# Patient Record
Sex: Female | Born: 1935 | Race: White | Hispanic: No | Smoking: Never smoker
Health system: Southern US, Community
[De-identification: ages and names within clinical notes are randomized; demographics above are authoritative.]

## PROBLEM LIST (undated history)

## (undated) DIAGNOSIS — K5790 Diverticulosis of intestine, part unspecified, without perforation or abscess without bleeding: Secondary | ICD-10-CM

## (undated) DIAGNOSIS — R319 Hematuria, unspecified: Secondary | ICD-10-CM

## (undated) DIAGNOSIS — J45909 Unspecified asthma, uncomplicated: Secondary | ICD-10-CM

## (undated) DIAGNOSIS — J449 Chronic obstructive pulmonary disease, unspecified: Secondary | ICD-10-CM

## (undated) DIAGNOSIS — I10 Essential (primary) hypertension: Secondary | ICD-10-CM

## (undated) DIAGNOSIS — C439 Malignant melanoma of skin, unspecified: Secondary | ICD-10-CM

## (undated) DIAGNOSIS — M810 Age-related osteoporosis without current pathological fracture: Secondary | ICD-10-CM

## (undated) DIAGNOSIS — K449 Diaphragmatic hernia without obstruction or gangrene: Secondary | ICD-10-CM

## (undated) DIAGNOSIS — IMO0002 Reserved for concepts with insufficient information to code with codable children: Secondary | ICD-10-CM

## (undated) DIAGNOSIS — S2239XA Fracture of one rib, unspecified side, initial encounter for closed fracture: Secondary | ICD-10-CM

## (undated) DIAGNOSIS — M1990 Unspecified osteoarthritis, unspecified site: Secondary | ICD-10-CM

## (undated) DIAGNOSIS — M419 Scoliosis, unspecified: Secondary | ICD-10-CM

## (undated) DIAGNOSIS — K219 Gastro-esophageal reflux disease without esophagitis: Secondary | ICD-10-CM

## (undated) DIAGNOSIS — N390 Urinary tract infection, site not specified: Secondary | ICD-10-CM

## (undated) HISTORY — DX: Urinary tract infection, site not specified: N39.0

## (undated) HISTORY — DX: Chronic obstructive pulmonary disease, unspecified: J44.9

## (undated) HISTORY — PX: MELANOMA EXCISION: SHX5266

## (undated) HISTORY — DX: Diaphragmatic hernia without obstruction or gangrene: K44.9

## (undated) HISTORY — DX: Unspecified osteoarthritis, unspecified site: M19.90

## (undated) HISTORY — DX: Essential (primary) hypertension: I10

## (undated) HISTORY — DX: Gastro-esophageal reflux disease without esophagitis: K21.9

## (undated) HISTORY — DX: Reserved for concepts with insufficient information to code with codable children: IMO0002

## (undated) HISTORY — DX: Hematuria, unspecified: R31.9

## (undated) HISTORY — DX: Diverticulosis of intestine, part unspecified, without perforation or abscess without bleeding: K57.90

## (undated) HISTORY — DX: Malignant melanoma of skin, unspecified: C43.9

## (undated) HISTORY — DX: Scoliosis, unspecified: M41.9

## (undated) HISTORY — DX: Age-related osteoporosis without current pathological fracture: M81.0

## (undated) HISTORY — DX: Fracture of one rib, unspecified side, initial encounter for closed fracture: S22.39XA

---

## 1998-02-13 ENCOUNTER — Ambulatory Visit (HOSPITAL_COMMUNITY): Admission: RE | Admit: 1998-02-13 | Discharge: 1998-02-13 | Payer: Self-pay | Admitting: Obstetrics and Gynecology

## 1998-03-06 ENCOUNTER — Other Ambulatory Visit: Admission: RE | Admit: 1998-03-06 | Discharge: 1998-03-06 | Payer: Self-pay | Admitting: Obstetrics and Gynecology

## 1998-04-05 ENCOUNTER — Ambulatory Visit (HOSPITAL_COMMUNITY): Admission: RE | Admit: 1998-04-05 | Discharge: 1998-04-05 | Payer: Self-pay | Admitting: Obstetrics and Gynecology

## 1998-06-26 HISTORY — PX: HYSTEROSCOPY: SHX211

## 1998-06-29 ENCOUNTER — Ambulatory Visit (HOSPITAL_COMMUNITY): Admission: RE | Admit: 1998-06-29 | Discharge: 1998-06-29 | Payer: Self-pay | Admitting: Obstetrics and Gynecology

## 1999-06-06 ENCOUNTER — Other Ambulatory Visit: Admission: RE | Admit: 1999-06-06 | Discharge: 1999-06-06 | Payer: Self-pay | Admitting: Obstetrics and Gynecology

## 2000-04-30 ENCOUNTER — Encounter: Payer: Self-pay | Admitting: Internal Medicine

## 2000-04-30 LAB — HM COLONOSCOPY

## 2000-10-15 ENCOUNTER — Other Ambulatory Visit: Admission: RE | Admit: 2000-10-15 | Discharge: 2000-10-15 | Payer: Self-pay | Admitting: Obstetrics and Gynecology

## 2001-11-18 ENCOUNTER — Other Ambulatory Visit: Admission: RE | Admit: 2001-11-18 | Discharge: 2001-11-18 | Payer: Self-pay | Admitting: Obstetrics and Gynecology

## 2001-11-23 DIAGNOSIS — C439 Malignant melanoma of skin, unspecified: Secondary | ICD-10-CM

## 2001-11-23 HISTORY — DX: Malignant melanoma of skin, unspecified: C43.9

## 2005-05-29 ENCOUNTER — Other Ambulatory Visit: Admission: RE | Admit: 2005-05-29 | Discharge: 2005-05-29 | Payer: Self-pay | Admitting: Obstetrics and Gynecology

## 2005-09-24 ENCOUNTER — Encounter: Payer: Self-pay | Admitting: Obstetrics and Gynecology

## 2006-10-27 ENCOUNTER — Encounter: Admission: RE | Admit: 2006-10-27 | Discharge: 2006-10-27 | Payer: Self-pay | Admitting: Neurosurgery

## 2007-11-25 ENCOUNTER — Other Ambulatory Visit: Admission: RE | Admit: 2007-11-25 | Discharge: 2007-11-25 | Payer: Self-pay | Admitting: Obstetrics & Gynecology

## 2008-06-27 ENCOUNTER — Encounter: Admission: RE | Admit: 2008-06-27 | Discharge: 2008-06-27 | Payer: Self-pay | Admitting: Neurosurgery

## 2008-12-01 ENCOUNTER — Encounter: Admission: RE | Admit: 2008-12-01 | Discharge: 2008-12-01 | Payer: Self-pay | Admitting: Neurosurgery

## 2009-06-27 DIAGNOSIS — M1990 Unspecified osteoarthritis, unspecified site: Secondary | ICD-10-CM | POA: Insufficient documentation

## 2009-06-27 DIAGNOSIS — J309 Allergic rhinitis, unspecified: Secondary | ICD-10-CM | POA: Insufficient documentation

## 2009-09-04 ENCOUNTER — Telehealth: Payer: Self-pay | Admitting: Internal Medicine

## 2009-09-04 ENCOUNTER — Encounter (INDEPENDENT_AMBULATORY_CARE_PROVIDER_SITE_OTHER): Payer: Self-pay | Admitting: *Deleted

## 2010-02-08 ENCOUNTER — Encounter: Admission: RE | Admit: 2010-02-08 | Discharge: 2010-02-08 | Payer: Self-pay | Admitting: Neurosurgery

## 2010-05-15 ENCOUNTER — Encounter: Payer: Self-pay | Admitting: Internal Medicine

## 2010-05-30 ENCOUNTER — Encounter (INDEPENDENT_AMBULATORY_CARE_PROVIDER_SITE_OTHER): Payer: Self-pay | Admitting: *Deleted

## 2010-06-05 ENCOUNTER — Telehealth: Payer: Self-pay | Admitting: Internal Medicine

## 2010-06-16 ENCOUNTER — Encounter: Payer: Self-pay | Admitting: Neurosurgery

## 2010-06-25 NOTE — Letter (Signed)
Summary: New Patient letter  [facility]  [address] [place], [place] [address]   Phone: [phone]  Fax: [phone]       09/04/2009 MRN: [id number]  Michele [name] [address] [place], [place]  [address]  Dear Michele [name],  Welcome to the Gastroenterology Division at [facility].    You are scheduled to see Dr. Lina Sar on 10-01-09 at 3:45pm, on the 3rd floor at [facility], [address]  We ask that you try to arrive at our office 15 minutes prior to your appointment time to allow for check-in.  We would like you to complete the enclosed self-administered evaluation form prior to your visit and bring it with you on the day of your appointment.  We will review it with you.  Also, please bring a complete list of all your medications or, if you prefer, bring the medication bottles and we will list them.  Please bring your insurance card so that we may make a copy of it.  If your insurance requires a referral to see a specialist, please bring your referral form from your primary care physician.  Co-payments are due at the time of your visit and may be paid by cash, check or credit card.     Your office visit will consist of a consult with your physician (includes a physical exam), any laboratory testing he/she may order, scheduling of any necessary diagnostic testing (e.g. x-ray, ultrasound, CT-scan), and scheduling of a procedure (e.g. Endoscopy, Colonoscopy) if required.  Please allow enough time on your schedule to allow for any/all of these possibilities.    If you cannot keep your appointment, please call [phone] to cancel or reschedule prior to your appointment date.  This allows [place] the opportunity to schedule an appointment for another patient in need of care.  If you do not cancel or reschedule by 5 p.m. the business day prior to your appointment date, you will be charged a $50.00 late cancellation/no-show fee.    Thank you for  choosing [facility] for your medical needs.  We appreciate the opportunity to care for you.  Please visit [place] at our website  to learn more about our practice.                     Sincerely,                                                             The Gastroenterology Division   Appended Document: New Patient letter Letter mailed to patient.

## 2010-06-25 NOTE — Procedures (Signed)
Summary: COLONOSCOPY    Patient Name: Michele [name], Michele [name] MRN:  Procedure Procedures: Colonoscopy CPT: [phone].  Personnel: Endoscopist: Latifa Noble L. Juanda Chance, MD.  Referred By: Pearletha Furl Jacky Kindle, MD.  Exam Location: Exam performed in Outpatient Clinic. Outpatient  Patient Consent: Procedure, Alternatives, Risks and Benefits discussed, consent obtained, from patient.  Indications Symptoms: Abdominal pain / bloating.  Comments: acute attack  of RLQ abd. pain 2 months ago.which has resolved afte antibiotics History  Pre-Exam Physical: Performed Apr 30, 2000. Cardio-pulmonary exam, Rectal exam, HEENT exam , Abdominal exam, Extremity exam, Neurological exam, Mental status exam WNL.  Exam Exam: Extent of exam reached: Cecum, extent intended: Cecum.  ASA Classification: II. Tolerance: excellent.  Monitoring: Pulse and BP monitoring, Oximetry used. Supplemental O2 given.  Colon Prep Used Phospho Soda for colon prep. Prep results: excellent.  Sedation Meds: Fentanyl 100 mcg. Versed 7 mg.  Findings - DIVERTICULOSIS: Descending Colon to Sigmoid Colon. Not bleeding. ICD9: Diverticulosis: 562.10. Comments: only few  diverticuli seen, but colon is tortuous.   Assessment Abnormal examination, see findings above.  Diagnoses: 562.10: Diverticulosis.   Events  Unplanned Interventions: No intervention was required.  Unplanned Events: There were no complications. Plans Medication Plan: Continue current medications. Fiber supplements: Psyllium 1 Tbsp QD, starting Apr 30, 2000   Patient Education: Patient given standard instructions for: Yearly hemoccult testing recommended. Patient instructed to get routine colonoscopy every 10 years.  Comments: high fiber diet Disposition: After procedure patient sent to recovery. After recovery patient sent home.  Scheduling/Referral: Follow-Up prn.    CC: Richard A.Aronson,MD  This report was created from the original endoscopy report,  which was reviewed and signed by the above listed endoscopist.

## 2010-06-25 NOTE — Progress Notes (Signed)
Summary: Triage-Dysphagia  Phone Note Call from Patient Call back at Home Phone [phone]   Caller: Patient Call For: Dr. Juanda Chance Reason for Call: Talk to Nurse Summary of Call: pt is having problems swallowing. Initial call taken by: Karna Christmas,  September 04, 2009 10:11 AM  Follow-up for Phone Call        Pt. c/o episodes of dysphagia, started about 6 weeks ago. Takes  OTC Prilosec at bedtime. Denies bloating, constipation, diarrhea, blood,black stools. Declines to see an extender, only wants to see Dr.Hien Cunliffe. Will be out of town until 09-30-09.  1) See Dr.Mckenize Mezera on 10-01-09 at 3:45pm 2) Increase Prilosec to two times a day until OV 3) Soft,bland diet. Be very careful with meats,rice and breads. 4) If symptoms become worse call back immediately or go to ER.   Follow-up by: Laureen Ochs LPN,  September 04, 2009 10:29 AM

## 2010-06-27 NOTE — Procedures (Signed)
Summary: Recall Assessment  Recall Assessment   Imported By: Lester [place] 06/12/2010 10:48:48  _____________________________________________________________________  External Attachment:    Type:   Image     Comment:   External Document

## 2010-06-27 NOTE — Letter (Signed)
Summary: Colonoscopy Letter  [facility]  [address] [place], [place] [address]   Phone: [phone]  Fax: [phone]      May 30, 2010 MRN: [id number]   Michele [name] [address] [place], [place]  [address]   Dear Ms. Swisher,   According to your medical record, it is time for you to schedule a Colonoscopy. The American Cancer Society recommends this procedure as a method to detect early colon cancer. Patients with a family history of colon cancer, or a personal history of colon polyps or inflammatory bowel disease are at increased risk.  This letter has been generated based on the recommendations made at the time of your procedure. If you feel that in your particular situation this may no longer apply, please contact our office.  Please call our office at [phone] to schedule this appointment or to update your records at your earliest convenience.  Thank you for cooperating with [place] to provide you with the very best care possible.   Sincerely,  Hedwig Morton. Juanda Chance, M.D.  [facility] Gastroenterology Division [phone]

## 2010-06-27 NOTE — Progress Notes (Signed)
Summary: re letter  Phone Note Call from Patient Call back at Home Phone [phone]   Caller: Patient Reason for Call: Talk to Nurse Summary of Call: pt calling re Colonoscopy letter.   Initial call taken by: Roe Coombs,  June 05, 2010 4:27 PM  Follow-up for Phone Call        This message should be for Dr. Juanda Chance in GI. I will forward to GI. Sherri Rad, RN, BSN  June 05, 2010 5:07 PM  Follow-up by: Hart Carwin MD,  June 05, 2010 8:36 PM  Additional Follow-up for Phone Call Additional follow up Details #1::        I have left a message for the patient to call back. Dottie Nelson-Smith CMA Duncan Dull)  June 06, 2010 1:27 PM   Patient questions whether she needs a colonoscopy (she received a recall letter) as she has been told that "once you reach your 80's you probably dont need a colonoscopy anymore." I have explained to her that this is false information. Explained that once a patient reaches the age of 34, we do have them come in the office to see Dr Juanda Chance to discuss whether a colonoscopy would be okay to go through with. Patient states she would actually like to wait until April to have her colonoscopy as she has some other health concerns occuring right now and will probably be coming around April to see Dr Veverly Fells (she has been taken off Fosamax and the physician asked her to be off of that for 3 months or so to see if symptoms subside.) Patient states that she will call us back closer to April. Additional Follow-up by: Lamona Curl CMA Duncan Dull),  June 06, 2010 4:13 PM

## 2010-08-15 DIAGNOSIS — M810 Age-related osteoporosis without current pathological fracture: Secondary | ICD-10-CM | POA: Insufficient documentation

## 2010-12-10 ENCOUNTER — Ambulatory Visit: Payer: PRIVATE HEALTH INSURANCE | Attending: Internal Medicine | Admitting: Physical Therapy

## 2010-12-10 DIAGNOSIS — M2550 Pain in unspecified joint: Secondary | ICD-10-CM | POA: Insufficient documentation

## 2010-12-10 DIAGNOSIS — M2560 Stiffness of unspecified joint, not elsewhere classified: Secondary | ICD-10-CM | POA: Insufficient documentation

## 2010-12-10 DIAGNOSIS — IMO0001 Reserved for inherently not codable concepts without codable children: Secondary | ICD-10-CM | POA: Insufficient documentation

## 2010-12-10 DIAGNOSIS — R5381 Other malaise: Secondary | ICD-10-CM | POA: Insufficient documentation

## 2010-12-17 ENCOUNTER — Ambulatory Visit: Payer: PRIVATE HEALTH INSURANCE | Admitting: Physical Therapy

## 2010-12-24 ENCOUNTER — Ambulatory Visit: Payer: PRIVATE HEALTH INSURANCE | Admitting: Physical Therapy

## 2011-01-15 ENCOUNTER — Encounter: Payer: Self-pay | Admitting: Physical Therapy

## 2011-01-21 ENCOUNTER — Encounter: Payer: Self-pay | Admitting: Physical Therapy

## 2011-01-24 ENCOUNTER — Other Ambulatory Visit: Payer: Self-pay | Admitting: Orthopedic Surgery

## 2011-01-24 DIAGNOSIS — M48061 Spinal stenosis, lumbar region without neurogenic claudication: Secondary | ICD-10-CM

## 2011-01-28 ENCOUNTER — Other Ambulatory Visit: Payer: Self-pay | Admitting: Orthopedic Surgery

## 2011-01-28 ENCOUNTER — Ambulatory Visit
Admission: RE | Admit: 2011-01-28 | Discharge: 2011-01-28 | Disposition: A | Discharge status: Home / Self Care | Payer: PRIVATE HEALTH INSURANCE | Source: Ambulatory Visit | Attending: Orthopedic Surgery | Admitting: Orthopedic Surgery

## 2011-01-28 DIAGNOSIS — M48061 Spinal stenosis, lumbar region without neurogenic claudication: Secondary | ICD-10-CM

## 2011-01-28 MED ORDER — IOHEXOL 180 MG/ML  SOLN
1.0000 mL | Freq: Once | INTRAMUSCULAR | Status: AC | PRN
  Administered 2011-01-28: 1 mL via EPIDURAL

## 2011-01-28 MED ORDER — METHYLPREDNISOLONE ACETATE 40 MG/ML INJ SUSP (RADIOLOG
120.0000 mg | Freq: Once | INTRAMUSCULAR | Status: AC
  Administered 2011-01-28: 120 mg via EPIDURAL

## 2011-08-25 DIAGNOSIS — IMO0002 Reserved for concepts with insufficient information to code with codable children: Secondary | ICD-10-CM

## 2011-08-25 HISTORY — DX: Reserved for concepts with insufficient information to code with codable children: IMO0002

## 2012-02-12 ENCOUNTER — Encounter: Payer: Self-pay | Admitting: Internal Medicine

## 2012-04-13 LAB — HM MAMMOGRAPHY: HM Mammogram: NORMAL

## 2012-04-13 LAB — HM DEXA SCAN

## 2012-04-27 ENCOUNTER — Encounter (HOSPITAL_COMMUNITY): Payer: Self-pay | Admitting: Emergency Medicine

## 2012-04-27 ENCOUNTER — Inpatient Hospital Stay (HOSPITAL_COMMUNITY)
Admission: AD | Admit: 2012-04-27 | Discharge: 2012-04-30 | DRG: 872 | Disposition: A | Discharge status: Home / Self Care | Payer: Medicare Other | Source: Other Acute Inpatient Hospital | Attending: Internal Medicine | Admitting: Internal Medicine

## 2012-04-27 ENCOUNTER — Inpatient Hospital Stay (HOSPITAL_COMMUNITY): Payer: Medicare Other

## 2012-04-27 DIAGNOSIS — N390 Urinary tract infection, site not specified: Secondary | ICD-10-CM | POA: Diagnosis present

## 2012-04-27 DIAGNOSIS — A4151 Sepsis due to Escherichia coli [E. coli]: Principal | ICD-10-CM | POA: Diagnosis present

## 2012-04-27 DIAGNOSIS — R7989 Other specified abnormal findings of blood chemistry: Secondary | ICD-10-CM | POA: Diagnosis present

## 2012-04-27 DIAGNOSIS — R7881 Bacteremia: Secondary | ICD-10-CM | POA: Diagnosis present

## 2012-04-27 DIAGNOSIS — Z887 Allergy status to serum and vaccine status: Secondary | ICD-10-CM

## 2012-04-27 DIAGNOSIS — R748 Abnormal levels of other serum enzymes: Secondary | ICD-10-CM | POA: Diagnosis present

## 2012-04-27 DIAGNOSIS — Z79899 Other long term (current) drug therapy: Secondary | ICD-10-CM

## 2012-04-27 DIAGNOSIS — R110 Nausea: Secondary | ICD-10-CM | POA: Diagnosis present

## 2012-04-27 DIAGNOSIS — K219 Gastro-esophageal reflux disease without esophagitis: Secondary | ICD-10-CM | POA: Diagnosis present

## 2012-04-27 DIAGNOSIS — A419 Sepsis, unspecified organism: Secondary | ICD-10-CM | POA: Diagnosis present

## 2012-04-27 DIAGNOSIS — I10 Essential (primary) hypertension: Secondary | ICD-10-CM | POA: Diagnosis present

## 2012-04-27 DIAGNOSIS — J45909 Unspecified asthma, uncomplicated: Secondary | ICD-10-CM | POA: Diagnosis present

## 2012-04-27 LAB — COMPREHENSIVE METABOLIC PANEL
AST: 20 U/L (ref 0–37)
Albumin: 2.4 g/dL — ABNORMAL LOW (ref 3.5–5.2)
Calcium: 7.5 mg/dL — ABNORMAL LOW (ref 8.4–10.5)
Creatinine, Ser: 0.67 mg/dL (ref 0.50–1.10)
GFR calc non Af Amer: 79 mL/min — ABNORMAL LOW (ref >90)

## 2012-04-27 LAB — CBC WITH DIFFERENTIAL/PLATELET
Eosinophils Absolute: 0.1 10*3/uL (ref 0.0–0.7)
Hemoglobin: 9.9 g/dL — ABNORMAL LOW (ref 12.0–15.0)
Lymphocytes Relative: 7 % — ABNORMAL LOW (ref 12–46)
Lymphs Abs: 0.9 10*3/uL (ref 0.7–4.0)
MCH: 31 pg (ref 26.0–34.0)
MCV: 97.5 fL (ref 78.0–100.0)
Monocytes Relative: 12 % (ref 3–12)
Neutrophils Relative %: 80 % — ABNORMAL HIGH (ref 43–77)
RBC: 3.19 MIL/uL — ABNORMAL LOW (ref 3.87–5.11)
WBC: 11.9 10*3/uL — ABNORMAL HIGH (ref 4.0–10.5)

## 2012-04-27 LAB — TROPONIN I: Troponin I: <0.3 ng/mL (ref <0.30)

## 2012-04-27 MED ORDER — POTASSIUM CHLORIDE 10 MEQ/100ML IV SOLN
10.0000 meq | INTRAVENOUS | Status: AC
  Administered 2012-04-27 (×4): 10 meq via INTRAVENOUS
  Filled 2012-04-27 (×4): qty 100

## 2012-04-27 MED ORDER — ASPIRIN EC 81 MG PO TBEC
81.0000 mg | DELAYED_RELEASE_TABLET | Freq: Every day | ORAL | Status: DC
  Administered 2012-04-27 – 2012-04-30 (×4): 81 mg via ORAL
  Filled 2012-04-27 (×5): qty 1

## 2012-04-27 MED ORDER — SODIUM CHLORIDE 0.9 % IJ SOLN
3.0000 mL | Freq: Two times a day (BID) | INTRAMUSCULAR | Status: DC
  Administered 2012-04-27 – 2012-04-29 (×4): 3 mL via INTRAVENOUS

## 2012-04-27 MED ORDER — LOSARTAN POTASSIUM 50 MG PO TABS
50.0000 mg | ORAL_TABLET | Freq: Every day | ORAL | Status: DC
  Administered 2012-04-27: 50 mg via ORAL
  Filled 2012-04-27 (×2): qty 1

## 2012-04-27 MED ORDER — MONTELUKAST SODIUM 10 MG PO TABS
10.0000 mg | ORAL_TABLET | Freq: Every day | ORAL | Status: DC
  Administered 2012-04-27 – 2012-04-29 (×3): 10 mg via ORAL
  Filled 2012-04-27 (×4): qty 1

## 2012-04-27 MED ORDER — SODIUM CHLORIDE 0.9 % IV SOLN
INTRAVENOUS | Status: DC
  Administered 2012-04-27 – 2012-04-30 (×4): via INTRAVENOUS

## 2012-04-27 MED ORDER — ACETAMINOPHEN 650 MG RE SUPP: 650.0000 mg | Freq: Four times a day (QID) | RECTAL | Status: DC | PRN

## 2012-04-27 MED ORDER — POTASSIUM CHLORIDE CRYS ER 20 MEQ PO TBCR
40.0000 meq | EXTENDED_RELEASE_TABLET | ORAL | Status: AC
  Administered 2012-04-27 (×2): 40 meq via ORAL
  Filled 2012-04-27 (×2): qty 2

## 2012-04-27 MED ORDER — ENOXAPARIN SODIUM 40 MG/0.4ML ~~LOC~~ SOLN
40.0000 mg | SUBCUTANEOUS | Status: DC
  Administered 2012-04-27 – 2012-04-29 (×3): 40 mg via SUBCUTANEOUS
  Filled 2012-04-27 (×5): qty 0.4

## 2012-04-27 MED ORDER — ONDANSETRON HCL 4 MG/2ML IJ SOLN
4.0000 mg | Freq: Four times a day (QID) | INTRAMUSCULAR | Status: DC | PRN
  Administered 2012-04-29: 4 mg via INTRAVENOUS
  Filled 2012-04-27: qty 2

## 2012-04-27 MED ORDER — ACETAMINOPHEN 325 MG PO TABS
650.0000 mg | ORAL_TABLET | Freq: Four times a day (QID) | ORAL | Status: DC | PRN
  Administered 2012-04-27 – 2012-04-29 (×3): 650 mg via ORAL
  Filled 2012-04-27 (×4): qty 2

## 2012-04-27 MED ORDER — ALUM & MAG HYDROXIDE-SIMETH 200-200-20 MG/5ML PO SUSP: 30.0000 mL | Freq: Four times a day (QID) | ORAL | Status: DC | PRN

## 2012-04-27 MED ORDER — CIPROFLOXACIN IN D5W 400 MG/200ML IV SOLN
400.0000 mg | Freq: Two times a day (BID) | INTRAVENOUS | Status: DC
  Administered 2012-04-27 – 2012-04-30 (×7): 400 mg via INTRAVENOUS
  Filled 2012-04-27 (×8): qty 200

## 2012-04-27 MED ORDER — ONDANSETRON HCL 4 MG PO TABS
4.0000 mg | ORAL_TABLET | Freq: Four times a day (QID) | ORAL | Status: DC | PRN
  Administered 2012-04-27 – 2012-04-29 (×5): 4 mg via ORAL
  Filled 2012-04-27 (×3): qty 1

## 2012-04-27 MED ORDER — BOOST / RESOURCE BREEZE PO LIQD: 1.0000 | Freq: Every day | ORAL | Status: DC | PRN

## 2012-04-27 MED ORDER — ZOLPIDEM TARTRATE 5 MG PO TABS
5.0000 mg | ORAL_TABLET | Freq: Every evening | ORAL | Status: DC | PRN
  Administered 2012-04-27 – 2012-04-29 (×3): 5 mg via ORAL
  Filled 2012-04-27 (×3): qty 1

## 2012-04-27 MED ORDER — POLYETHYLENE GLYCOL 3350 17 G PO PACK: 17.0000 g | PACK | Freq: Every day | ORAL | Status: DC | PRN

## 2012-04-27 MED ORDER — HYDROCODONE-ACETAMINOPHEN 5-325 MG PO TABS: 1.0000 | ORAL_TABLET | ORAL | Status: DC | PRN

## 2012-04-27 MED ORDER — MORPHINE SULFATE 2 MG/ML IJ SOLN: 1.0000 mg | INTRAMUSCULAR | Status: DC | PRN

## 2012-04-27 MED ORDER — ALPRAZOLAM 0.25 MG PO TABS: 0.2500 mg | ORAL_TABLET | Freq: Three times a day (TID) | ORAL | Status: DC | PRN

## 2012-04-27 MED ORDER — PANTOPRAZOLE SODIUM 40 MG PO TBEC
40.0000 mg | DELAYED_RELEASE_TABLET | Freq: Every day | ORAL | Status: DC
  Administered 2012-04-27 – 2012-04-30 (×4): 40 mg via ORAL
  Filled 2012-04-27 (×5): qty 1

## 2012-04-27 NOTE — Progress Notes (Signed)
Subjective: Michele [name] is resting well. She wakens this morning and despite the long that he is quite clear about the events. This obviously is quite contrary to her delirium that she experienced back at the [place]. Obviously the antibiotics have improved her condition significantly. I had been in touch through the weekend with the [place] physician in the appeared of carried out all the orders and I have requested to include the Cipro with the exception of imaging. Given the slow response in terms of white blood cell count and given her continued nausea we will proceed with some additional imaging. She's having no chest pain or shortness of breath. She does continue to have significant nausea but no dominant pain no frank vomiting. Her intake has been very poor but is slowly improving  Objective: Vital signs in last 24 hours: Temp:  [98.5 F (36.9 C)-98.6 F (37 C)] 98.6 F (37 C) (12/03 0552) Pulse Rate:  [79-92] 92  (12/03 0552) Resp:  [15-17] 15  (12/03 0552) BP: (150-172)/(91-97) 150/92 mmHg (12/03 0613) SpO2:  [94 %-100 %] 94 % (12/03 0552) Weight:  [50.712 kg (111 lb 12.8 oz)] 50.712 kg (111 lb 12.8 oz) (12/03 0056) Weight change:   CBG (last 3)  No results found for this basename: GLUCAP:3 in the last 72 hours  Intake/Output from previous day:    Physical Exam: Patient is awake alert bright conversant. I temporal wasting but good facial symmetry. No JVD or bruits. Lungs are clear to auscultation percussion. Cardiovascular exam is regular no murmur. Abdomen is soft nontender good bowel sounds no hepatosplenomegaly. Back is extremely kyphoscoliotic but no CVA tenderness. Extremities are without edema. Calves soft and nontender. Negative Homans sign. Neurologic exam is completely normal and mentally she is cognitively intact.   Lab Results:  [facility] 04/27/12 0334  NA 131*  K 2.4*  CL 95*  CO2 23  GLUCOSE 264*  BUN 8  CREATININE 0.67  CALCIUM 7.5*  MG --  PHOS --    Basename  04/27/12 0334  AST 20  ALT 16  ALKPHOS 69  BILITOT 0.5  PROT 5.8*  ALBUMIN 2.4*    Basename 04/27/12 0334  WBC 11.9*  NEUTROABS 9.5*  HGB 9.9*  HCT 31.1*  MCV 97.5  PLT 241   No results found for this basename: INR, PROTIME    Basename 04/27/12 0333  CKTOTAL --  CKMB --  CKMBINDEX --  TROPONINI <0.30   No results found for this basename: TSH,T4TOTAL,FREET3,T3FREE,THYROIDAB in the last 72 hours No results found for this basename: VITAMINB12:2,FOLATE:2,FERRITIN:2,TIBC:2,IRON:2,RETICCTPCT:2 in the last 72 hours  Studies/Results: No results found.   Assessment/Plan: #1 Escherichia coli UTI  #2 Escherichia coli sepsis seemingly responding to the IVC a probe urine sensitivities appropriate for Cipro blood sensitivity not present in the record  #3 continued nausea bleed secondary to systemic infection we'll check an ultrasound to rule out a perinephric abscess and evaluate her gallbladder  #4 positive cardiac enzymes believed a demand related ischemia Will check echocardiogram subsequent troponin was negative  #5 essential hypertension restarting medication  #6 asthma well-controlled   LOS: 0 days   [name] A 04/27/2012, 7:10 AM

## 2012-04-27 NOTE — H&P (Signed)
PCP:  Minda Meo, MD   Chief Complaint:  Sick while in [place]  HPI: Michele [name] is an 78 year old white female with a history of asthma, hypertension who was transferred via airplane from a hospital in [place] for further management of sepsis secondary to urinary tract infection. The patient was vacationing with her family and [place] when she became ill. She states that on one day prior to her travel she developed some urinary symptoms. She called our office and was prescribed Bactrim. However, because her symptoms improved she did not start this. Shortly after arrival in [place] 5 days ago she started to feel ill-symptoms included fatigue, nausea, fever. She was evaluated by the hotel physician and given a shot of antibiotics and oral sulfa as well as IV fluids. Her family went on an excursion and left her with a sitter. When they returned later that afternoon (Friday) they found she was increasingly disoriented and ill appearing. She was admitted to the hospital in [place] where she was found have a urinary tract infection, leukocytosis with white blood count 22,000. Urine and blood culture subsequently grew Escherichia coli sensitive to Cipro. She was treated with Cipro with gentamicin for synergy. She was given IV fluid hydration. Despite this, she continued to fill ill with ongoing severe nausea, intermittent fevers, and waxing and waning delirium. She was also concerned about the facilities in [place] and arranged for transport via [facility] to [facility].  Her family is at her bedside and is concerned about a report of some abnormal heart test. It appears that her troponin was slightly elevated based on the scale from their laboratory. She denies any chest pain or shortness of breath.  Review of Systems:  Review of Systems - all systems reviewed with the patient and are negative except as in history of present illness.  Past Medical History: Hypertension Allergic  Rhinitis Osteoarthritis Osteopenia Asthma GERD Scoliosis with lumbar spondylosis A right C7 radiculopathy some COPD Chronic Back pain skin cancers  Past Surgical History: None  Medications: Losartan 50mg  BID Ambien 10mg  qhs prn Alprazolam 0.25mg  BID prn Prilosec OTC 20mg  daily prn Prolia q6 months Ca/Vitamin D BID Tramadol 50mg  every 8 hours as needed for pain  Allergies:  Pneumovax-->rash  Social History: Widowed with 3 children. No tobacco. Social drinker. Likes to hike and has been very active, hiking all over the world in the past.  Family History: Parents deceased Family history positive for asthma, hay fever, sinus problems, and eczema.  Physical Exam: Filed Vitals:   04/27/12 0056  BP: 155/97  Pulse: 79  Temp: 98.5 F (36.9 C)  TempSrc: Oral  Resp: 17  Height: 5' (1.524 m)  Weight: 50.712 kg (111 lb 12.8 oz)  SpO2: 100%   General appearance: alert and appears younger than stated age Head: Normocephalic, without obvious abnormality, atraumatic Eyes: conjunctivae/corneas clear. PERRL, EOM's intact.  Nose: Nares normal. Septum midline. Mucosa normal. No drainage or sinus tenderness. Throat: lips, mucosa, and tongue normal; teeth and gums normal Neck: no adenopathy, no carotid bruit, no JVD and thyroid not enlarged, symmetric, no tenderness/mass/nodules Resp: clear to auscultation bilaterally Cardio: regular rate and rhythm, S1, S2 normal, no murmur, click, rub or gallop GI: soft, non-tender; bowel sounds normal; no masses,  no organomegaly Extremities: extremities normal, atraumatic, no cyanosis or edema Pulses: 2+ and symmetric Lymph nodes: Cervical adenopathy: no cervical lymphadenopathy Neurologic: Alert and oriented X 3, normal strength and tone.    Labs on Admission (from outside  hospital reports):  Urine cultures from [place]- E.coli sensitive to Cipro Blood cultures- +E.coli WBC 22.6 (11/29)-->18.3 (12/2) Troponin 0.071 (normal less than  0.034) Cr- 0.91  Radiological Exams on Admission: No results found.  Outside EKG- NSR with poor R wave progression  Assessment/Plan Principal Problem: 1. *Sepsis secondary to UTI- it appears that she's been receiving appropriate treatment with IV antibiotics and IV fluid hydration. She is slowly improving with resolution of fever and slight improvement in leukocytosis. We'll repeat labs and blood cultures.  Continue Cipro 400 mg IV twice a day. Will discontinue gentamicin at this point. Her blood pressure has been stable, actually elevated and tachycardia has improved.  Active Problems: 2. E coli bacteremia- repeat blood cultures. Continue IV antibiotics. 3. Nausea-likely secondary to #1. We'll treat with Zofran as needed 4. Elevation of cardiac enzymes- slight elevation of troponin is likely secondary to supply-demand ischemia in the setting of tachycardia. We'll repeat EKG and cardiac enzymes here. Monitor on telemetry. 5. Hypertension- blood pressure has been elevated throughout her hospitalization. We'll resume low-dose losartan and monitor renal function. 6. Asthma- continue Singulair. No current symptoms. 7. Disposition- anticipate discharge to home in 2-3 days.  PT/OT evaluation.   Michele [name],W Michele [name] 04/27/2012, 1:25 AM

## 2012-04-27 NOTE — Evaluation (Signed)
Physical Therapy Evaluation Patient Details Name: Michele [name] MRN: [id number] DOB: [birth year] Today's Date: 04/27/2012 Time: 4098-1191 PT Time Calculation (min): 33 min  PT Assessment / Plan / Recommendation Clinical Impression  pt admitted with Nausea from UTI and sepsis.  Pt is improving now, but is weak and deconditioned.  Pt can benefit from PT on acute to help maximize independence.    PT Assessment  Patient needs continued PT services    Follow Up Recommendations  No PT follow up    Does the patient have the potential to tolerate intense rehabilitation      Barriers to Discharge        Equipment Recommendations  None recommended by PT    Recommendations for Other Services     Frequency Min 3X/week    Precautions / Restrictions     Pertinent Vitals/Pain       Mobility  Bed Mobility Bed Mobility: Supine to Sit;Sitting - Scoot to Edge of Bed;Sit to Supine Supine to Sit: 6: Modified independent (Device/Increase time) Sitting - Scoot to Edge of Bed: 7: Independent Sit to Supine: 7: Independent Transfers Transfers: Sit to Stand;Stand to Sit Sit to Stand: 4: Min assist;With upper extremity assist;From bed Stand to Sit: 4: Min guard Details for Transfer Assistance: minimal assist for initial stability Ambulation/Gait Ambulation/Gait Assistance: 4: Min guard Ambulation Distance (Feet): 250 Feet Assistive device: Other (Comment) (pushing IV pole) Ambulation/Gait Assistance Details: steady gait, but shakey when standing still Gait Pattern: Within Functional Limits General Gait Details: Quick to fatigue Wheelchair Mobility Wheelchair Mobility: No    Shoulder Instructions     Exercises     PT Diagnosis: Generalized weakness  PT Problem List: Decreased strength;Decreased activity tolerance PT Treatment Interventions: Gait training;Stair training;Therapeutic activities;Patient/family education   PT Goals Acute Rehab PT Goals PT Goal Formulation: With  patient Time For Goal Achievement: 05/04/12 Potential to Achieve Goals: Good Pt will go Supine/Side to Sit: Independently PT Goal: Supine/Side to Sit - Progress: Goal set today Pt will go Sit to Stand: Independently PT Goal: Sit to Stand - Progress: Goal set today Pt will Transfer Bed to Chair/Chair to Bed: Independently PT Transfer Goal: Bed to Chair/Chair to Bed - Progress: Goal set today Pt will Ambulate: >150 feet;Independently PT Goal: Ambulate - Progress: Goal set today Pt will Go Up / Down Stairs: 3-5 stairs;with modified independence;with rail(s) PT Goal: Up/Down Stairs - Progress: Goal set today  Visit Information  Last PT Received On: 04/27/12 Assistance Needed: +1    Subjective Data  Subjective: They med-vac'd me back from [place] and my feet really haven't hit the floor in a week Patient Stated Goal: Back Independent   Prior Functioning  Home Living Lives With: Alone Type of Home: House Home Access: Stairs to enter Entrance Stairs-Rails: Right Home Layout: Two level;Able to live on main level with bedroom/bathroom Bathroom Shower/Tub: Tub/shower unit (takes bath) Bathroom Toilet: Standard Home Adaptive Equipment: Straight cane Prior Function Level of Independence: Independent Able to Take Stairs?: Yes Driving: Yes Communication Communication: No difficulties Dominant Hand: Right    Cognition  Overall Cognitive Status: Appears within functional limits for tasks assessed/performed Arousal/Alertness: Awake/alert Orientation Level: Appears intact for tasks assessed;Oriented X4 / Intact Behavior During Session: [facility] for tasks performed    Extremity/Trunk Assessment Right Upper Extremity Assessment RUE ROM/Strength/Tone: Within functional levels Left Upper Extremity Assessment LUE ROM/Strength/Tone: Within functional levels Right Lower Extremity Assessment RLE ROM/Strength/Tone: Within functional levels Left Lower Extremity Assessment LLE ROM/Strength/Tone:  Within functional levels  Trunk Assessment Trunk Assessment: Normal   Balance Balance Balance Assessed: Yes Static Sitting Balance Static Sitting - Balance Support: No upper extremity supported;Feet supported Static Sitting - Level of Assistance: 5: Stand by assistance Static Standing Balance Static Standing - Balance Support: Left upper extremity supported;During functional activity Static Standing - Level of Assistance: 5: Stand by assistance  End of Session PT - End of Session Activity Tolerance: Patient tolerated treatment well Patient left: in bed;with call bell/phone within reach;with family/visitor present Nurse Communication: Mobility status  GP     Reiana Poteet, Eliseo Gum 04/27/2012, 12:44 PM  04/27/2012  Conyers Bing, PT [phone] (pager)

## 2012-04-27 NOTE — Progress Notes (Signed)
INITIAL ADULT NUTRITION ASSESSMENT Date: 04/27/2012   Time: 2:45 PM  INTERVENTION:  Add Resource Breeze supplement daily PRN (250 kcals, 9 gm protein per 8 fl oz carton) RD to follow for nutrition care plan  DOCUMENTATION CODES Per approved criteria  -Not Applicable   Reason for Assessment: Malnutrition Screening Tool Report  ASSESSMENT: Female 78 y.o.  Dx: Sepsis secondary to UTI  Hx: History reviewed. No pertinent past medical history.  Related Meds:     . aspirin EC  81 mg Oral Daily  . ciprofloxacin  400 mg Intravenous Q12H  . enoxaparin (LOVENOX) injection  40 mg Subcutaneous Q24H  . losartan  50 mg Oral Daily  . montelukast  10 mg Oral QHS  . pantoprazole  40 mg Oral Q0600  . [COMPLETED] potassium chloride  10 mEq Intravenous Q1 Hr x 4  . [COMPLETED] potassium chloride  40 mEq Oral Q4H  . sodium chloride  3 mL Intravenous Q12H    Ht: 5' (152.4 cm)  Wt: 111 lb 12.8 oz (50.712 kg)  Ideal Wt: 45.4 kg % Ideal Wt: 111%  Usual Wt: unknown % Usual Wt: ---  Body mass index is 21.83 kg/(m^2).  Food/Nutrition Related Hx: recent weight loss without trying (patient unsure) per admission nutrition screen  Labs:  CMP     Component Value Date/Time   NA 131* 04/27/2012 0334   K 2.4* 04/27/2012 0334   CL 95* 04/27/2012 0334   CO2 23 04/27/2012 0334   GLUCOSE 264* 04/27/2012 0334   BUN 8 04/27/2012 0334   CREATININE 0.67 04/27/2012 0334   CALCIUM 7.5* 04/27/2012 0334   PROT 5.8* 04/27/2012 0334   ALBUMIN 2.4* 04/27/2012 0334   AST 20 04/27/2012 0334   ALT 16 04/27/2012 0334   ALKPHOS 69 04/27/2012 0334   BILITOT 0.5 04/27/2012 0334   GFRNONAA 79* 04/27/2012 0334   GFRAA >90 04/27/2012 0334    Diet Order: General  Supplements/Tube Feeding: N/A  IVF:    sodium chloride Last Rate: 75 mL/hr at 04/27/12 0324    Estimated Nutritional Needs:   Kcal: 1300-1500 Protein: 60-70 gm Fluid: > 1.5 L  Patient was transferred via airplane from a hospital in [place] for  further management of sepsis secondary to urinary tract infection; RD spoke with patient daughters who report patient has a poor appetite and is still experiencing nausea; unsure if she's lost weight or not; amenable to patient having a nutritional supplement in place as needed -- RD to order.  NUTRITION DIAGNOSIS: -Inadequate oral intake (NI-2.1).  Status: Ongoing  RELATED TO: nausea, acute illness  AS EVIDENCE BY: family report  MONITORING/EVALUATION(Goals): Goal: Oral intake with meals & supplements to meet >/= 90% of estimated nutrition needs Monitor: PO & supplemental intake, weight, labs, I/O's  EDUCATION NEEDS: -No education needs identified at this time  Kirkland Hun, RD, LDN Pager #: [phone] After-Hours Pager #: [phone]

## 2012-04-27 NOTE — Progress Notes (Signed)
Utilization review completed.  

## 2012-04-27 NOTE — Progress Notes (Signed)
Received critical value of pt's K+ being 2.4. Md on call made aware. New orders received. Will cont to monitor the pt.

## 2012-04-28 LAB — CBC
HCT: 35.7 % — ABNORMAL LOW (ref 36.0–46.0)
Hemoglobin: 12.4 g/dL (ref 12.0–15.0)
RBC: 3.93 MIL/uL (ref 3.87–5.11)
WBC: 13 10*3/uL — ABNORMAL HIGH (ref 4.0–10.5)

## 2012-04-28 LAB — BASIC METABOLIC PANEL
BUN: 10 mg/dL (ref 6–23)
Chloride: 100 mEq/L (ref 96–112)
GFR calc Af Amer: 88 mL/min — ABNORMAL LOW (ref >90)
Potassium: 3.7 mEq/L (ref 3.5–5.1)
Sodium: 134 mEq/L — ABNORMAL LOW (ref 135–145)

## 2012-04-28 MED ORDER — LOSARTAN POTASSIUM 25 MG PO TABS
25.0000 mg | ORAL_TABLET | Freq: Every day | ORAL | Status: DC
  Administered 2012-04-28 – 2012-04-30 (×3): 25 mg via ORAL
  Filled 2012-04-28 (×3): qty 1

## 2012-04-28 MED ORDER — POTASSIUM CHLORIDE CRYS ER 20 MEQ PO TBCR
20.0000 meq | EXTENDED_RELEASE_TABLET | Freq: Every day | ORAL | Status: DC
  Administered 2012-04-28 – 2012-04-30 (×3): 20 meq via ORAL
  Filled 2012-04-28 (×3): qty 1

## 2012-04-28 NOTE — Clinical Documentation Improvement (Signed)
MALNUTRITION DOCUMENTATION CLARIFICATION  THIS DOCUMENT IS NOT A PERMANENT PART OF THE MEDICAL RECORD  TO RESPOND TO THE THIS QUERY, FOLLOW THE INSTRUCTIONS BELOW:  1. If needed, update documentation for the patient's encounter via the notes activity.  2. Access this query again and click edit on the In [employer].  3. After updating, or not, click F2 to complete all highlighted (required) fields concerning your review. Select "additional documentation in the medical record" OR "no additional documentation provided".  4. Click Sign note button.  5. The deficiency will fall out of your In Basket *Please let us know if you are not able to complete this workflow by phone or e-mail (listed below).  Please update your documentation within the medical record to reflect your response to this query.                                                                                        04/28/12   Dear Dr.[facility],  In a better effort to capture your patient's severity of illness, reflect appropriate length of stay and utilization of resources, a review of the patient medical record has revealed the following indicators.    Based on your clinical judgment, please clarify and document in a progress note and/or discharge summary the clinical condition associated with the following supporting information:  In responding to this query please exercise your independent judgment.  The fact that a query is asked, does not imply that any particular answer is desired or expected.  Possible Clinical Conditions?  _______Mild Malnutrition  _______Moderate Malnutrition _______Severe Malnutrition   _______Protein Calorie Malnutrition _______Severe Protein Calorie Malnutrition _______Emaciation  _______Cachexia   _______Other Condition _______Cannot clinically determine     Risk Factors: Temporal wasting noted per 12/04 progress notes. Inadequate oral intake related to nausea, acute  illness; recent weight loss without trying per RD's 12/03 evaluation.  Signs & Symptoms: Ht: 5'    Wt: 50.712kg  BMI:  21.83  Treatment: 12/03:  Add Resource Breeze daily prn.  Nutrition Consult: 12/03   You may use possible, probable, or suspect with inpatient documentation. possible, probable, suspected diagnoses MUST be documented at the time of discharge  Reviewed: No additional documentation provided.                            Thank You,  Marciano Sequin,  Clinical Documentation Specialist:  Pager: [phone]  Phone: [phone]  Health Information Management [place]

## 2012-04-28 NOTE — Progress Notes (Signed)
Physical Therapy Treatment Patient Details Name: Michele [name] MRN: [id number] DOB: 1927-01-30 Today's Date: 04/28/2012 Time: 4098-1191 PT Time Calculation (min): 23 min  PT Assessment / Plan / Recommendation Comments on Treatment Session  Improving daily, still unsteady and not yet confident without use of the rail    Follow Up Recommendations  Home health PT;Other (comment) (may decide she will go to a SNF ffor rehab)     Does the patient have the potential to tolerate intense rehabilitation     Barriers to Discharge        Equipment Recommendations  None recommended by PT    Recommendations for Other Services    Frequency Min 3X/week   Plan Discharge plan remains appropriate;Frequency remains appropriate    Precautions / Restrictions Precautions Precautions: Fall Restrictions Weight Bearing Restrictions: No   Pertinent Vitals/Pain     Mobility  Bed Mobility Bed Mobility: Not assessed Transfers Transfers: Sit to Stand;Stand to Sit Sit to Stand: 4: Min guard;With upper extremity assist;From chair/3-in-1;From toilet Stand to Sit: 4: Min guard;With upper extremity assist;To chair/3-in-1;To toilet Details for Transfer Assistance: min guard for safety due to mild instabililty Ambulation/Gait Ambulation/Gait Assistance: 4: Min guard Ambulation Distance (Feet): 550 Feet Assistive device: Other (Comment) (rail ) Ambulation/Gait Assistance Details: mildly unsteady throughout with occaisional use of the rail.  Approx.75 feet with min HHA and rapid gait speed. Gait Pattern: Within Functional Limits General Gait Details: Less fatigue today Stairs: No    Exercises     PT Diagnosis:    PT Problem List:   PT Treatment Interventions:     PT Goals Acute Rehab PT Goals Time For Goal Achievement: 05/04/12 Potential to Achieve Goals: Good PT Goal: Supine/Side to Sit - Progress: Progressing toward goal PT Goal: Sit to Stand - Progress: Progressing toward goal PT  Transfer Goal: Bed to Chair/Chair to Bed - Progress: Progressing toward goal PT Goal: Ambulate - Progress: Met  Visit Information  Last PT Received On: 04/28/12 Assistance Needed: +1    Subjective Data      Cognition  Overall Cognitive Status: Appears within functional limits for tasks assessed/performed Arousal/Alertness: Awake/alert Orientation Level: Appears intact for tasks assessed;Oriented X4 / Intact Behavior During Session: [facility] for tasks performed    Balance     End of Session PT - End of Session Activity Tolerance: Patient tolerated treatment well Patient left: in chair;with call bell/phone within reach;with nursing in room Nurse Communication: Mobility status   GP     [name], Eliseo Gum 04/28/2012, 6:15 PM  04/28/2012  [place] Bing, PT [phone] (pager)

## 2012-04-28 NOTE — Evaluation (Signed)
Occupational Therapy Evaluation Patient Details Name: Michele [name] MRN: [id number] DOB: 02/04/1927 Today's Date: 04/28/2012 Time: 0835-0900 OT Time Calculation (min): 25 min  OT Assessment / Plan / Recommendation Clinical Impression  Pt admitted with sepsis and nausea secondary to UTI. Pt will benefit from skilled OT in the acute setting to maximize I with ADL and ADL mobility prior to d/c. Currently, recommend 24hr assist upon d/c home- family and pt are to discuss whether they prefer hiring 24hr assist vs d/c to ST-SNF.     OT Assessment  Patient needs continued OT Services    Follow Up Recommendations  Home health OT (vs. SNF pending pt/family decision)    Barriers to Discharge      Equipment Recommendations  None recommended by PT (TBD- may need 3n1)    Recommendations for Other Services    Frequency  Min 2X/week    Precautions / Restrictions Precautions Precautions: Fall Restrictions Weight Bearing Restrictions: No   Pertinent Vitals/Pain Pt denies any pain at this time     ADL  Grooming: Wash/dry hands;Wash/dry face;Teeth care;Min guard Where Assessed - Grooming: Supported standing (pt leans against/on sink) Upper Body Dressing: Set up Where Assessed - Upper Body Dressing: Unsupported sitting Lower Body Dressing: Minimal assistance Where Assessed - Lower Body Dressing: Unsupported sit to stand Toilet Transfer: Min Pension scheme manager Method: Sit to Barista: Regular height toilet;Grab bars Toileting - Architect and Hygiene: Min guard Where Assessed - Engineer, mining and Hygiene: Sit to stand from 3-in-1 or toilet Equipment Used: Gait belt Transfers/Ambulation Related to ADLs: Min HHA with ambulation ADL Comments: Pt generally weak. Family to speak with pt regarding d/c home vs d/c to SNF. Family will hire 24hr assist, if needed.    OT Diagnosis: Generalized weakness  OT Problem List: Decreased  strength;Decreased activity tolerance;Decreased knowledge of use of DME or AE;Decreased knowledge of precautions OT Treatment Interventions: Self-care/ADL training;DME and/or AE instruction;Therapeutic activities;Patient/family education;Balance training   OT Goals Acute Rehab OT Goals OT Goal Formulation: With patient Time For Goal Achievement: 05/05/12 Potential to Achieve Goals: Good ADL Goals Pt Will Perform Grooming: Independently;Standing at sink ADL Goal: Grooming - Progress: Goal set today Pt Will Perform Upper Body Dressing: Independently;Sitting, bed;Sitting, chair ADL Goal: Upper Body Dressing - Progress: Goal set today Pt Will Perform Lower Body Dressing: Independently;Sit to stand from chair;Sit to stand from bed ADL Goal: Lower Body Dressing - Progress: Goal set today Pt Will Transfer to Toilet: Independently;Ambulation ADL Goal: Toilet Transfer - Progress: Goal set today Pt Will Perform Toileting - Clothing Manipulation: Independently;Standing ADL Goal: Toileting - Clothing Manipulation - Progress: Goal set today Pt Will Perform Toileting - Hygiene: Independently;Sitting on 3-in-1 or toilet ADL Goal: Toileting - Hygiene - Progress: Goal set today Pt Will Perform Tub/Shower Transfer: Tub transfer;with supervision ADL Goal: Tub/Shower Transfer - Progress: Goal set today  Visit Information  Last OT Received On: 04/28/12 Assistance Needed: +1    Subjective Data  Subjective: Michele [name] was the first day I've been up in a week Patient Stated Goal: Return home   Prior Functioning     Home Living Lives With: Alone Available Help at Discharge:  (family to talk about 24hr assist) Type of Home: House Home Access: Stairs to enter Entrance Stairs-Rails: Right Home Layout: Two level;Able to live on main level with bedroom/bathroom Bathroom Shower/Tub: Tub/shower unit (takes bath) Bathroom Toilet: Standard Home Adaptive Equipment: Straight cane Prior Function Level of  Independence: Independent Able to Take  Stairs?: Yes Driving: Yes Vocation: Retired Musician: No difficulties Dominant Hand: Right         Vision/Perception     Cognition  Overall Cognitive Status: Appears within functional limits for tasks assessed/performed Arousal/Alertness: Awake/alert Orientation Level: Appears intact for tasks assessed;Oriented X4 / Intact Behavior During Session: WFL for tasks performed    Extremity/Trunk Assessment Right Upper Extremity Assessment RUE ROM/Strength/Tone: Deficits RUE ROM/Strength/Tone Deficits: grossly 4+/5; easily fatigued Left Upper Extremity Assessment LUE ROM/Strength/Tone: Deficits LUE ROM/Strength/Tone Deficits: grossly 4+/5; easily fatigued     Mobility Bed Mobility Supine to Sit: 6: Modified independent (Device/Increase time) Sitting - Scoot to Edge of Bed: 7: Independent     Shoulder Instructions     Exercise     Balance     End of Session OT - End of Session Equipment Utilized During Treatment: Gait belt Activity Tolerance: Patient tolerated treatment well (however, easily fatigued) Patient left: in chair;with call bell/phone within reach;with family/visitor present Nurse Communication: Mobility status  GO     Michele [name] 04/28/2012, 12:52 PM

## 2012-04-28 NOTE — Progress Notes (Signed)
Subjective: Slow better, still poor intake Therapy noted [place] clear  Objective: Vital signs in last 24 hours: Temp:  [98.4 F (36.9 C)-99.3 F (37.4 C)] 98.9 F (37.2 C) (12/04 0500) Pulse Rate:  [89-94] 92  (12/04 0500) Resp:  [15-16] 15  (12/04 0500) BP: (69-159)/(37-96) 112/64 mmHg (12/04 0502) SpO2:  [96 %-97 %] 96 % (12/04 0500) Weight:  [49.533 kg (109 lb 3.2 oz)] 49.533 kg (109 lb 3.2 oz) (12/04 0500) Weight change: -1.179 kg (-2 lb 9.6 oz)  CBG (last 3)  No results found for this basename: GLUCAP:3 in the last 72 hours  Intake/Output from previous day: 12/03 0701 - 12/04 0700 In: -  Out: 800 [Urine:800]  Physical Exam:  Patient is awake alert bright conversant. I temporal wasting but good facial symmetry. No JVD or bruits. Lungs are clear to auscultation percussion. Cardiovascular exam is regular no murmur. Abdomen is soft nontender good bowel sounds no hepatosplenomegaly. Back is extremely kyphoscoliotic but no CVA tenderness. Extremities are without edema. Calves soft and nontender. Negative Homans sign. Neurologic exam is completely normal and mentally she is cognitively intact.      Lab Results:  [facility] 04/27/12 0334  NA 131*  K 2.4*  CL 95*  CO2 23  GLUCOSE 264*  BUN 8  CREATININE 0.67  CALCIUM 7.5*  MG --  PHOS --    Basename 04/27/12 0334  AST 20  ALT 16  ALKPHOS 69  BILITOT 0.5  PROT 5.8*  ALBUMIN 2.4*    Basename 04/27/12 0334  WBC 11.9*  NEUTROABS 9.5*  HGB 9.9*  HCT 31.1*  MCV 97.5  PLT 241   No results found for this basename: INR, PROTIME    Basename 04/27/12 1536 04/27/12 0803 04/27/12 0333  CKTOTAL -- -- --  CKMB -- -- --  CKMBINDEX -- -- --  TROPONINI <0.30 <0.30 <0.30   No results found for this basename: TSH,T4TOTAL,FREET3,T3FREE,THYROIDAB in the last 72 hours No results found for this basename: VITAMINB12:2,FOLATE:2,FERRITIN:2,TIBC:2,IRON:2,RETICCTPCT:2 in the last 72 hours  Studies/Results: US Abdomen  Complete  04/27/2012  *RADIOLOGY REPORT*  Clinical Data:  abdominal pain.  COMPLETE ABDOMINAL ULTRASOUND  Comparison:  None.  Findings:  Gallbladder:  No gallstones, gallbladder wall thickening, or pericholecystic fluid.  Common bile duct:   Normal caliber, 4 mm.  Liver:  Mildly lobular contour without focal abnormality.  No biliary duct dilatation.  Normal echotexture.  IVC:  Appears normal.  Pancreas:  No focal abnormality seen.  Spleen:  Mildly echogenic. No hydronephrosis.  Small scattered cysts, the largest measuring up to 8 mm.  Right Kidney:   Mildly echogenic.  No hydronephrosis or focal abnormality.  Left Kidney:  Normal in size and parenchymal echogenicity.  No evidence of mass or hydronephrosis.  Abdominal aorta:  No aneurysm identified.  IMPRESSION: Mildly echogenic kidneys bilaterally which may be related to chronic medical renal disease.  No hydronephrosis.  No perinephric abscess.  No evidence of cholelithiasis or cholecystitis.   Original Report Authenticated By: Charlett Nose, M.D.      Assessment/Plan:  1 Escherichia coli UTI  #2 Escherichia coli sepsis seemingly responding to the IVC a probe urine sensitivities appropriate for Cipro blood sensitivity not present in the record  #3 continued nausea bleed secondary to systemic infection we'll check an ultrasound to rule out a perinephric abscess and evaluate her gallbladder  #4 positive cardiac enzymes believed a demand related ischemia Will check echocardiogram subsequent troponin was negative  #5 essential hypertension restarting medication  #6  asthma well-controlled  Will complete 7 days iv cipro--currently day 6--earlier in prior hospital   LOS: 1 day   Dahir Ayer A 04/28/2012, 7:14 AM

## 2012-04-28 NOTE — Progress Notes (Signed)
MD on call made aware of pt's bp currently 112/64 manually and that is usually runs in the 150s over 90s. Pt concerned but asymptomatic. No new orders given at this time. Will continue to monitor the pt. Sanda Linger

## 2012-04-29 LAB — CBC
HCT: 32.9 % — ABNORMAL LOW (ref 36.0–46.0)
Hemoglobin: 11.1 g/dL — ABNORMAL LOW (ref 12.0–15.0)
MCH: 30.5 pg (ref 26.0–34.0)
MCV: 90.4 fL (ref 78.0–100.0)
RBC: 3.64 MIL/uL — ABNORMAL LOW (ref 3.87–5.11)
WBC: 11.3 10*3/uL — ABNORMAL HIGH (ref 4.0–10.5)

## 2012-04-29 LAB — BASIC METABOLIC PANEL
CO2: 23 mEq/L (ref 19–32)
Calcium: 7.5 mg/dL — ABNORMAL LOW (ref 8.4–10.5)
Chloride: 105 mEq/L (ref 96–112)
Creatinine, Ser: 0.85 mg/dL (ref 0.50–1.10)
Glucose, Bld: 92 mg/dL (ref 70–99)

## 2012-04-29 NOTE — Progress Notes (Signed)
Physical Therapy Treatment Patient Details Name: Michele [name] MRN: [id number] DOB: 1927-03-14 Today's Date: 04/29/2012 Time: 6578-4696 PT Time Calculation (min): 28 min  PT Assessment / Plan / Recommendation Comments on Treatment Session  Becoming steadier each day.  Discussion of how her progression needs to happen after D/C    Follow Up Recommendations  Home health PT;Other (comment)     Does the patient have the potential to tolerate intense rehabilitation     Barriers to Discharge        Equipment Recommendations  None recommended by PT    Recommendations for Other Services    Frequency Min 3X/week   Plan Discharge plan remains appropriate;Frequency remains appropriate    Precautions / Restrictions Precautions Precautions: Fall (lesser risk of falling) Restrictions Weight Bearing Restrictions: No   Pertinent Vitals/Pain     Mobility  Bed Mobility Bed Mobility: Supine to Sit;Sitting - Scoot to Edge of Bed Supine to Sit: 7: Independent Sitting - Scoot to Edge of Bed: 7: Independent Transfers Transfers: Sit to Stand;Stand to Sit Sit to Stand: 5: Supervision;With upper extremity assist;From bed Stand to Sit: 5: Supervision Details for Transfer Assistance: still mildly unsteady, but safe at Supervision Ambulation/Gait Ambulation/Gait Assistance: 5: Supervision;4: Min assist (min assist when assked to amb. at a rapid pace) Ambulation Distance (Feet): 700 Feet Assistive device: 1 person hand held assist;Other (Comment) (rail or none) Ambulation/Gait Assistance Details: mildly unsteady at a slower pace where pt feels she is safer, but she is steadier at a slightly faster speed and without assist.  Pt is steady at appropriately rapid speeds, but does not feel confident with at least a finger to hold to. Gait Pattern: Within Functional Limits General Gait Details: Less fatigue today Stairs: Yes Stairs Assistance: 4: Min guard Stair Management Technique: One rail  Right;Alternating pattern;Step to pattern;Forwards (alt up and step to down) Number of Stairs: 4  Wheelchair Mobility Wheelchair Mobility: No    Exercises     PT Diagnosis:    PT Problem List:   PT Treatment Interventions:     PT Goals Acute Rehab PT Goals Time For Goal Achievement: 05/04/12 Potential to Achieve Goals: Good PT Goal: Supine/Side to Sit - Progress: Met PT Goal: Sit to Stand - Progress: Progressing toward goal PT Transfer Goal: Bed to Chair/Chair to Bed - Progress: Progressing toward goal PT Goal: Ambulate - Progress: Progressing toward goal PT Goal: Up/Down Stairs - Progress: Progressing toward goal  Visit Information  Last PT Received On: 04/29/12 Assistance Needed: +1    Subjective Data      Cognition  Overall Cognitive Status: Appears within functional limits for tasks assessed/performed Arousal/Alertness: Awake/alert Orientation Level: Appears intact for tasks assessed;Oriented X4 / Intact Behavior During Session: [facility] for tasks performed    Balance     End of Session PT - End of Session Activity Tolerance: Patient tolerated treatment well Patient left: with family/visitor present (at EOB) Nurse Communication: Mobility status   GP     Ananiah Maciolek, Eliseo Gum 04/29/2012, 5:56 PM  04/29/2012  [place] Bing, PT [phone] (pager)

## 2012-04-29 NOTE — Progress Notes (Signed)
Subjective: Better each day Still low grade nausea Better pos  Objective: Vital signs in last 24 hours: Temp:  [97.6 F (36.4 C)-99 F (37.2 C)] 97.6 F (36.4 C) (12/05 1330) Pulse Rate:  [84-90] 84  (12/05 1330) Resp:  [17-18] 17  (12/05 1330) BP: (128-174)/(69-93) 128/82 mmHg (12/05 1330) SpO2:  [95 %-98 %] 95 % (12/05 1330) Weight change:   CBG (last 3)  No results found for this basename: GLUCAP:3 in the last 72 hours  Intake/Output from previous day: 12/04 0701 - 12/05 0700 In: 625 [P.O.:625] Out: 1250 [Urine:1250]  Physical Exam:  Patient is awake alert bright conversant. I temporal wasting but good facial symmetry. No JVD or bruits. Lungs are clear to auscultation percussion. Cardiovascular exam is regular no murmur. Abdomen is soft nontender good bowel sounds no hepatosplenomegaly. Back is extremely kyphoscoliotic but no CVA tenderness. Extremities are without edema. Calves soft and nontender. Negative Homans sign. Neurologic exam is completely normal and mentally she is cognitively intact.    Lab Results:  [facility] 04/29/12 0455 04/28/12 0924  NA 138 134*  K 4.1 3.7  CL 105 100  CO2 23 23  GLUCOSE 92 98  BUN 12 10  CREATININE 0.85 0.75  CALCIUM 7.5* 7.9*  MG -- --  PHOS -- --    Basename 04/27/12 0334  AST 20  ALT 16  ALKPHOS 69  BILITOT 0.5  PROT 5.8*  ALBUMIN 2.4*    Basename 04/29/12 0455 04/28/12 0924 04/27/12 0334  WBC 11.3* 13.0* --  NEUTROABS -- -- 9.5*  HGB 11.1* 12.4 --  HCT 32.9* 35.7* --  MCV 90.4 90.8 --  PLT 314 323 --   No results found for this basename: INR, PROTIME    Basename 04/27/12 1536 04/27/12 0803 04/27/12 0333  CKTOTAL -- -- --  CKMB -- -- --  CKMBINDEX -- -- --  TROPONINI <0.30 <0.30 <0.30   No results found for this basename: TSH,T4TOTAL,FREET3,T3FREE,THYROIDAB in the last 72 hours No results found for this basename: VITAMINB12:2,FOLATE:2,FERRITIN:2,TIBC:2,IRON:2,RETICCTPCT:2 in the last 72  hours  Studies/Results: No results found.   Assessment/Plan: 1 Escherichia coli UTI  #2 Escherichia coli sepsis seemingly responding to the IVC a probe urine sensitivities appropriate for Cipro blood sensitivity not present in the record  #3 continued nausea bleed secondary to systemic infection we'll check an ultrasound to rule out a perinephric abscess and evaluate her gallbladder  #4 positive cardiac enzymes believed a demand related ischemia Will check echocardiogram subsequent troponin was negative  #5 essential hypertension restarting medication  #6 asthma well-controlled  Home friday   LOS: 2 days   Jenetta Wease A 04/29/2012, 6:16 PM

## 2012-04-29 NOTE — Care Management Note (Signed)
    Page 1 of 2   04/30/2012     1:16:02 PM   CARE MANAGEMENT NOTE 04/30/2012  Patient:  Michele [name], Michele [name]   Account Number:  [id number]  Date Initiated:  04/27/2012  Documentation initiated by:  Donn Pierini  Subjective/Objective Assessment:   Pt admitted with urosepsis     Action/Plan:   PTA pt lived at home- NCM to follow for pt progression and d/c planning   Anticipated DC Date:  04/30/2012   Anticipated DC Plan:  HOME W HOME HEALTH SERVICES      DC Planning Services  CM consult      [facility] Choice  HOME HEALTH   Choice offered to / List presented to:  C-1 Patient        HH arranged  HH-1 RN  HH-2 PT  HH-3 OT      [facility] agency  [facility]   Status of service:  Completed, signed off Medicare Important Message given?   (If response is "NO", the following Medicare IM given date fields will be blank) Date Medicare IM given:   Date Additional Medicare IM given:    Discharge Disposition:  HOME W HOME HEALTH SERVICES  Per UR Regulation:  Reviewed for med. necessity/level of care/duration of stay  If discussed at Long Length of Stay Meetings, dates discussed:    Comments:  04/30/12 Rosalita Chessman [phone] PT FOR DC HOME TODAY.  REFERRAL TO [facility], PER CHOICE FOR HH FOLLOW UP.  START OF CARE 24-48H POST DC DATE.  NO DME NEEDS, PER PT.  04/29/12 Hedi Barkan,RN,BSN [phone] MET WITH PT TO FINALIZE DISCHARGE PLANS.  PT STATES FAMILY HAS MADE ARRANGEMENTS FOR PAID CAREGIVER TO PROVIDE 24HR CARE X 1 WEEK POST DISCHARGE.  SHE STATES SHE HAS A CANE AT HOME, AND DOES NOT NEED A ROLLING WALKER.  PT AGREEABLE TO HOME HEALTH FOR PT/OT AT DC.  WILL GIVE PT/FAMILY LIST OF [place] AGENCIES FOR REVIEW, AND ARRANGE ACCORDINGLY.

## 2012-04-30 LAB — CBC
HCT: 31.1 % — ABNORMAL LOW (ref 36.0–46.0)
MCH: 30.7 pg (ref 26.0–34.0)
MCV: 90.1 fL (ref 78.0–100.0)
Platelets: 349 10*3/uL (ref 150–400)
RDW: 13.3 % (ref 11.5–15.5)

## 2012-04-30 LAB — BASIC METABOLIC PANEL
BUN: 11 mg/dL (ref 6–23)
CO2: 24 mEq/L (ref 19–32)
Calcium: 7.6 mg/dL — ABNORMAL LOW (ref 8.4–10.5)
Chloride: 102 mEq/L (ref 96–112)
Creatinine, Ser: 0.76 mg/dL (ref 0.50–1.10)
GFR calc Af Amer: 88 mL/min — ABNORMAL LOW (ref >90)

## 2012-04-30 MED ORDER — PANTOPRAZOLE SODIUM 40 MG PO TBEC: 40.0000 mg | DELAYED_RELEASE_TABLET | Freq: Every day | ORAL | Status: DC

## 2012-04-30 MED ORDER — ONDANSETRON HCL 4 MG PO TABS: 4.0000 mg | ORAL_TABLET | Freq: Four times a day (QID) | ORAL | Status: DC | PRN

## 2012-04-30 MED ORDER — CIPROFLOXACIN HCL 500 MG PO TABS: 500.0000 mg | ORAL_TABLET | Freq: Two times a day (BID) | ORAL | Status: DC

## 2012-04-30 NOTE — Discharge Summary (Signed)
DISCHARGE SUMMARY  Michele [name]  MR#: [id number]  DOB:[birth year]  Date of Admission: 04/27/2012 Date of Discharge: 04/30/2012  Attending Physician:Chellie Vanlue A  Patient's PCP:No primary provider on file.  Consults:  none  Discharge Diagnoses: Principal Problem:  *Sepsis secondary to UTI Active Problems:  E coli bacteremia  Elevation of cardiac enzymes  Hypertension  Asthma  Nausea   Discharge Medications:   Medication List     As of 04/30/2012  8:42 AM    TAKE these medications         acetaminophen 500 MG tablet   Commonly known as: TYLENOL   Take 500-1,000 mg by mouth every 6 (six) hours as needed. For pain      ALPRAZolam 0.25 MG tablet   Commonly known as: XANAX   Take 0.125 mg by mouth daily as needed. For anxiety      calcium-vitamin D 500-200 MG-UNIT per tablet   Commonly known as: OSCAL WITH D   Take 1 tablet by mouth 2 (two) times daily.      ciprofloxacin 500 MG tablet   Commonly known as: CIPRO   Take 1 tablet (500 mg total) by mouth 2 (two) times daily.      losartan 50 MG tablet   Commonly known as: COZAAR   Take 50 mg by mouth daily.      montelukast 10 MG tablet   Commonly known as: SINGULAIR   Take 10 mg by mouth at bedtime.      multivitamin with minerals Tabs   Take 1 tablet by mouth daily.      ondansetron 4 MG tablet   Commonly known as: ZOFRAN   Take 1 tablet (4 mg total) by mouth every 6 (six) hours as needed for nausea.      pantoprazole 40 MG tablet   Commonly known as: PROTONIX   Take 1 tablet (40 mg total) by mouth daily at 6 (six) AM.      traMADol 50 MG tablet   Commonly known as: ULTRAM   Take 50 mg by mouth every 6 (six) hours as needed. For pain      vitamin C 500 MG tablet   Commonly known as: ASCORBIC ACID   Take 500 mg by mouth 2 (two) times daily.      zolpidem 10 MG tablet   Commonly known as: AMBIEN   Take 10 mg by mouth at bedtime as needed. For sleep        Hospital Procedures: US  Abdomen Complete  04/27/2012  *RADIOLOGY REPORT*  Clinical Data:  abdominal pain.  COMPLETE ABDOMINAL ULTRASOUND  Comparison:  None.  Findings:  Gallbladder:  No gallstones, gallbladder wall thickening, or pericholecystic fluid.  Common bile duct:   Normal caliber, 4 mm.  Liver:  Mildly lobular contour without focal abnormality.  No biliary duct dilatation.  Normal echotexture.  IVC:  Appears normal.  Pancreas:  No focal abnormality seen.  Spleen:  Mildly echogenic. No hydronephrosis.  Small scattered cysts, the largest measuring up to 8 mm.  Right Kidney:   Mildly echogenic.  No hydronephrosis or focal abnormality.  Left Kidney:  Normal in size and parenchymal echogenicity.  No evidence of mass or hydronephrosis.  Abdominal aorta:  No aneurysm identified.  IMPRESSION: Mildly echogenic kidneys bilaterally which may be related to chronic medical renal disease.  No hydronephrosis.  No perinephric abscess.  No evidence of cholelithiasis or cholecystitis.   Original Report Authenticated By: Charlett Nose, M.D.     History  of Present Illness: Michele [name] is an 78 year old white female with a history of asthma, hypertension who was transferred via airplane from a hospital in [place] for further management of sepsis secondary to urinary tract infection. The patient was vacationing with her family and [place] when she became ill. She states that on one day prior to her travel she developed some urinary symptoms. She called our office and was prescribed Bactrim. However, because her symptoms improved she did not start this. Shortly after arrival in [place] 5 days ago she started to feel ill-symptoms included fatigue, nausea, fever. She was evaluated by the hotel physician and given a shot of antibiotics and oral sulfa as well as IV fluids. Her family went on an excursion and left her with a sitter. When they returned later that afternoon (Friday) they found she was increasingly disoriented and ill appearing. She was  admitted to the hospital in [place] where she was found have a urinary tract infection, leukocytosis with white blood count 22,000. Urine and blood culture subsequently grew Escherichia coli sensitive to Cipro. She was treated with Cipro with gentamicin for synergy. She was given IV fluid hydration. Despite this, she continued to fill ill with ongoing severe nausea, intermittent fevers, and waxing and waning delirium. She was also concerned about the facilities in [place] and arranged for transport via [facility] to [facility]. Her family is at her bedside and is concerned about a report of some abnormal heart test. It appears that her troponin was slightly elevated based on the scale from their laboratory. She denies any chest pain or shortness of breath.  Hospital Course: Patient was admitted placed on IV Cipro continuing her course from the [place]. She remained afebrile and white blood cell count normalized. Cardiac enzymes were negative. Oral intake gradually improved. Nausea subsided. Her exam was completely normal. She remained normal from a cognitive standpoint throughout. She was working with therapy and I have instructed that she will need some home sitters or shoulders. He is discharged in stable improved condition  Day of Discharge Exam BP 143/85  Pulse 81  Temp 98 F (36.7 C) (Oral)  Resp 18  Ht 5' (1.524 m)  Wt 49.533 kg (109 lb 3.2 oz)  BMI 21.33 kg/m2  SpO2 98%  Physical Exam: Patient is awake alert good good spirits. She sitting up in a chair. Good facial symmetry bright alert eating breakfast. No JVD or bruits. Lungs are clear. Cardiovascular exam regular rate and rhythm no murmur. Abdomen soft and nontender. Extremities no cyanosis clubbing or edema. Neurologic exam is normal.   Discharge Labs:  [facility] 04/30/12 0540 04/29/12 0455  NA 136 138  K 3.3* 4.1  CL 102 105  CO2 24 23  GLUCOSE 109* 92  BUN 11 12  CREATININE 0.76 0.85  CALCIUM 7.6*  7.5*  MG -- --  PHOS -- --   No results found for this basename: AST:2,ALT:2,ALKPHOS:2,BILITOT:2,PROT:2,ALBUMIN:2 in the last 72 hours  Basename 04/30/12 0540 04/29/12 0455  WBC 10.0 11.3*  NEUTROABS -- --  HGB 10.6* 11.1*  HCT 31.1* 32.9*  MCV 90.1 90.4  PLT 349 314    Basename 04/27/12 1536  CKTOTAL --  CKMB --  CKMBINDEX --  TROPONINI <0.30   No results found for this basename: TSH,T4TOTAL,FREET3,T3FREE,THYROIDAB in the last 72 hours No results found for this basename: VITAMINB12:2,FOLATE:2,FERRITIN:2,TIBC:2,IRON:2,RETICCTPCT:2 in the last 72 hours  Discharge instructions:  home with help and nutritional support  Disposition: Home  Follow-up Appts: Follow-up  with Dr. Jacky Kindle at [facility] in *one week  Call for appointment.  Condition on Discharge: *Improved Tests Needing Follow-up: *None Signed: Johnnye Sandford A 04/30/2012, 8:42 AM

## 2012-04-30 NOTE — Progress Notes (Signed)
Occupational Therapy Treatment Patient Details Name: Michele [name] MRN: [id number] DOB: [birth year] Today's Date: 04/30/2012 Time: 1000-1023 OT Time Calculation (min): 23 min  OT Assessment / Plan / Recommendation Comments on Treatment Session Pt cont to exhibit generalized weakness and overall decreased endurance. Pt and dtr state that pt anticipates d/c home tomorrow w/ 24hr S-& PRN assist. Rec HHOT for home safety assessment and to make recommendations to pt/family/caregivers & maximize I w/ ADL's and self care tasks as well as homemaking as able.    Follow Up Recommendations  Home health OT;Supervision/Assistance - 24 hour                      Frequency Min 2X/week   Plan Discharge plan remains appropriate    Precautions / Restrictions Precautions Precautions: Fall Restrictions Weight Bearing Restrictions: No   Pertinent Vitals/Pain Pt w/o c/o pain, reports fatigue w/ activity.    ADL  Grooming: Performed;Wash/dry hands;Wash/dry face;Supervision/safety Where Assessed - Grooming: Supported standing;Other (comment) (pt leaned on sink while washing hands) Upper Body Bathing: Simulated;Set up;Supervision/safety Where Assessed - Upper Body Bathing: Unsupported sitting;Other (comment) (EOB) Upper Body Dressing: Simulated;Set up Where Assessed - Upper Body Dressing: Unsupported sitting Lower Body Dressing: Modified independent;Performed Where Assessed - Lower Body Dressing: Unsupported sit to stand;Unsupported sitting Toilet Transfer: Performed;Modified independent;Supervision/safety Toilet Transfer Method: Sit to Barista: Regular height toilet;Grab bars Toileting - Clothing Manipulation and Hygiene: Performed;Modified independent Where Assessed - Toileting Clothing Manipulation and Hygiene: Sit to stand from 3-in-1 or toilet Tub/Shower Transfer Method: Not assessed Equipment Used: Gait belt ADL Comments: Pt cont to exhibit generalized weakness  and overall decreased endurance. Pt and dtr state that pt anticipates d/c home tomorrow w/ 24hr S-& PRN assist. Rec HHOT for home safety assessment and recommendations to pt/family/caregiver.    OT Diagnosis:    OT Problem List:   OT Treatment Interventions:     OT Goals ADL Goals Pt Will Perform Grooming: with supervision ADL Goal: Grooming - Progress: Progressing toward goals ADL Goal: Upper Body Dressing - Progress: Progressing toward goals Pt Will Perform Lower Body Dressing: Sit to stand from chair;Sit to stand from bed;Independently ADL Goal: Lower Body Dressing - Progress: Progressing toward goals Pt Will Transfer to Toilet: Independently;Ambulation ADL Goal: Toilet Transfer - Progress: Progressing toward goals ADL Goal: Toileting - Clothing Manipulation - Progress: Progressing toward goals ADL Goal: Toileting - Hygiene - Progress: Progressing toward goals  Visit Information  Last OT Received On: 04/30/12 Assistance Needed: +1    Subjective Data  Subjective: "I might go home tomorrow" Patient Stated Goal: D/c home when medically able   Prior Functioning       Cognition  Overall Cognitive Status: Appears within functional limits for tasks assessed/performed Arousal/Alertness: Awake/alert Orientation Level: Appears intact for tasks assessed Behavior During Session: [facility] for tasks performed    Mobility  Shoulder Instructions Bed Mobility Bed Mobility: Supine to Sit;Sitting - Scoot to Edge of Bed;Sit to Supine Supine to Sit: 7: Independent Sitting - Scoot to Edge of Bed: 7: Independent Sit to Supine: 7: Independent Transfers Transfers: Sit to Stand;Stand to Sit Sit to Stand: 5: Supervision;With upper extremity assist Stand to Sit: 5: Supervision;Without upper extremity assist                 End of Session OT - End of Session Equipment Utilized During Treatment: Gait belt Activity Tolerance: Patient tolerated treatment well;Patient limited by fatigue Patient  left: in bed;with call bell/phone  within reach;with family/visitor present  GO     Alm Bustard 04/30/2012, 11:25 AM

## 2012-05-31 DIAGNOSIS — M47817 Spondylosis without myelopathy or radiculopathy, lumbosacral region: Secondary | ICD-10-CM | POA: Insufficient documentation

## 2012-06-15 DIAGNOSIS — B001 Herpesviral vesicular dermatitis: Secondary | ICD-10-CM | POA: Insufficient documentation

## 2012-08-16 ENCOUNTER — Telehealth: Payer: Self-pay | Admitting: Obstetrics & Gynecology

## 2012-08-16 NOTE — Telephone Encounter (Signed)
SCHEDULED APPT. WITH DR. MILLER FOR PESSARY CHECK AND CHANGE. PATIENT STATES HAS HAD IN SINCE October.

## 2012-08-16 NOTE — Telephone Encounter (Signed)
PT WOULD LIKE AN APPT WITH DR MILLER TO HAVE HER PESSARY CHECK.

## 2012-08-17 ENCOUNTER — Encounter: Payer: Self-pay | Admitting: Obstetrics & Gynecology

## 2012-08-18 ENCOUNTER — Ambulatory Visit (INDEPENDENT_AMBULATORY_CARE_PROVIDER_SITE_OTHER): Payer: Medicare Other | Admitting: Obstetrics & Gynecology

## 2012-08-18 ENCOUNTER — Encounter: Payer: Self-pay | Admitting: Obstetrics & Gynecology

## 2012-08-18 VITALS — BP 148/88 | HR 60 | Resp 16

## 2012-08-18 DIAGNOSIS — N8111 Cystocele, midline: Secondary | ICD-10-CM

## 2012-08-18 DIAGNOSIS — N390 Urinary tract infection, site not specified: Secondary | ICD-10-CM

## 2012-08-18 DIAGNOSIS — IMO0002 Reserved for concepts with insufficient information to code with codable children: Secondary | ICD-10-CM

## 2012-08-18 LAB — POCT URINALYSIS DIPSTICK
Nitrite, UA: NEGATIVE
Urobilinogen, UA: NEGATIVE
pH, UA: 5

## 2012-08-18 MED ORDER — LIDOCAINE 5 % EX OINT: TOPICAL_OINTMENT | CUTANEOUS | Status: DC | PRN

## 2012-08-18 NOTE — Patient Instructions (Signed)
Use Lidocaine ointment, small amount externally, before next appointment.

## 2012-08-18 NOTE — Progress Notes (Signed)
79 y.o. Widowed White female G3P3 here for pessary check.  Patient has been using following pessary style and size:  #4 ring pessary with support.  She is not sexually active.  She describes the following issues with the pessary.  She reports having a UTI while on a trip to [place].  Became septic.  Had to be flown home.  Now reports no dysuria.  No hematuria.  Does have urgency but this is not new to her.  No fevers.   ROS: no vaginal bleeding, no discharge or pelvic pain, no dysuria, trouble voiding or hematuria positive for - urinary frequency/urgency  Exam:   BP 148/88  Pulse 60  Resp 16 General appearance: alert and cooperative Inguinal adenopathy: none   Pelvic: External genitalia:  atrophic appearance              Urethra: not indicated and normal appearing urethra with no masses, tenderness or lesions              Bartholins and Skenes: Bartholin's, Urethra, Skene's normal                 Vagina: atrophic, PELVIC FLOOR EXAM: no cystocele, rectocele or prolapse noted, cystocele 4th degree              Cervix: normal appearance Bimanual Exam:  Uterus:  uterus is normal size, shape, consistency and nontender                               Adnexa: normal adnexa in size, nontender and no masses  Pessary was removed without difficulty.  Pessary was cleansed.  Pessary was replaced. Patient tolerated procedure well.    A: Normal exam      History of UTI  P:  Return to office in 4 months for recheck.       Lidocaine RX given for external use before next exam       Urine culture    An After Visit Summary was printed and given to the patient.

## 2012-12-08 ENCOUNTER — Encounter: Payer: Self-pay | Admitting: Obstetrics & Gynecology

## 2012-12-10 ENCOUNTER — Telehealth: Payer: Self-pay | Admitting: Obstetrics & Gynecology

## 2012-12-14 ENCOUNTER — Ambulatory Visit: Payer: Medicare Other | Admitting: Obstetrics & Gynecology

## 2012-12-14 NOTE — Telephone Encounter (Signed)
Called patient today 12/14/2012 from phone call on 12/10/2012 stated she had already  Been helped from the nurse.

## 2012-12-17 ENCOUNTER — Ambulatory Visit (INDEPENDENT_AMBULATORY_CARE_PROVIDER_SITE_OTHER): Payer: Medicare Other | Admitting: Obstetrics & Gynecology

## 2012-12-17 ENCOUNTER — Encounter: Payer: Self-pay | Admitting: Obstetrics & Gynecology

## 2012-12-17 VITALS — BP 136/80 | HR 68 | Ht 60.75 in | Wt 113.0 lb

## 2012-12-17 DIAGNOSIS — N8111 Cystocele, midline: Secondary | ICD-10-CM

## 2012-12-17 DIAGNOSIS — IMO0002 Reserved for concepts with insufficient information to code with codable children: Secondary | ICD-10-CM

## 2012-12-17 NOTE — Progress Notes (Signed)
79 y.o. WidowedWhite female G3P3 here for pessary check.  Patient has been using following pessary style and size:  #4 incontinence ring with support.  She is not sexually active.  Used topical lidocaine today to help with discomfort of removing pessary.  No vaginal bleeding.  No discharge.  ROS: no vaginal bleeding, no discharge or pelvic pain, no dysuria, trouble voiding or hematuria   Exam:   BP 136/80  Pulse 68  Ht 5' 0.75" (1.543 m)  Wt 113 lb (51.256 kg)  BMI 21.53 kg/m2 General appearance: alert and cooperative Inguinal adenopathy: none   Pelvic: External genitalia:  no lesions              Urethra: normal appearing urethra with no masses, tenderness or lesions              Bartholins and Skenes: normal                 Vagina: normal appearing vagina with normal color and discharge, no lesions, atrophic                                          Pessary was removed without difficulty.  Pessary was cleansed.  Pessary was replaced. Patient tolerated procedure well.    A: Cystocele  P:  Return to office in 3 months for recheck.     An After Visit Summary was printed and given to the patient.

## 2013-03-22 ENCOUNTER — Encounter: Payer: Self-pay | Admitting: Obstetrics & Gynecology

## 2013-03-29 ENCOUNTER — Ambulatory Visit: Payer: Medicare Other | Admitting: Obstetrics & Gynecology

## 2013-04-05 ENCOUNTER — Ambulatory Visit: Payer: Medicare Other | Admitting: Obstetrics & Gynecology

## 2013-04-28 ENCOUNTER — Ambulatory Visit: Payer: Medicare Other | Admitting: Obstetrics & Gynecology

## 2013-04-28 ENCOUNTER — Encounter: Payer: Self-pay | Admitting: Certified Nurse Midwife

## 2013-04-28 ENCOUNTER — Ambulatory Visit (INDEPENDENT_AMBULATORY_CARE_PROVIDER_SITE_OTHER): Payer: Medicare Other | Admitting: Certified Nurse Midwife

## 2013-04-28 VITALS — BP 122/80 | HR 72 | Resp 16 | Ht 60.75 in | Wt 114.0 lb

## 2013-04-28 DIAGNOSIS — N8111 Cystocele, midline: Secondary | ICD-10-CM

## 2013-04-28 DIAGNOSIS — Z4689 Encounter for fitting and adjustment of other specified devices: Secondary | ICD-10-CM

## 2013-04-28 DIAGNOSIS — IMO0002 Reserved for concepts with insufficient information to code with codable children: Secondary | ICD-10-CM

## 2013-04-28 NOTE — Progress Notes (Signed)
79 y.o. WidowedWhite female G3P3 here for pessary check.  Patient has been using following pessary style and size:  #4 ring pessary with support..  She is not sexually active.  She describes the following issues with the pessary:  Previous pain with removal. Denies vaginal bleeding or vaginal discharge Patient is not using vaginal cream or moisturizer with Pessary, prefers none.Denies urinary frequency, urgency or pain.  ROS: Pertinent to exam   BP 122/80  Pulse 72  Resp 16  Ht 5' 0.75" (1.543 m)  Wt 114 lb (51.71 kg)  BMI 21.72 kg/m2  LMP 05/26/1997 General appearance: alert, cooperative and appears stated age Affect: normal, Orientation x 3  abdomen soft, non tender   Pelvic: External genitalia:  no lesions and atrophic appearance              Urethra: normal appearing urethra with no masses, tenderness or lesions              Bartholins and Skenes: Bartholin's, Urethra, Skene's normal, non tender                 Vagina: atrophic, with pessary noted in place, removed, grade 4 cystocele noted, vagina inspected, no ulcerations or excoriations noted              Cervix: normal appearance and no ulcerations or excoriations noted, non tender Bimanual Exam:  Uterus:  uterus is normal size, shape, consistency and nontender                               Adnexa:    normal adnexa in size, nontender and no masses and no masses                               Anus:  defer exam  Pessary was removed without difficulty.  Pessary was cleansed.  Pessary was replaced with premarin cream used as lubricant.. Patient tolerated procedure well. Pessary noted in place. Patient ambulated, sat and voided with problems. Patient felt pessary in "good place, no pain with removal or insertion!"   A: Cystocele- symptomatic grade 4 with Ring pessary use with good support Atrophic vaginitis not using moisturizer for pessary      P:  Return to office in 3 months for recheck and aex, patient prefers Dr Hyacinth Meeker for exam  and would like D.Darcel Bayley to do pessary, will try to schedule to accomodate patient     Discussed importance of moisture with pessary use. Does not want estrogen. Discussed Olive oil use once weekly. Patient to try. Warning signs with pessary use given. RV as above  An After Visit Summary was printed and given to the patient.

## 2013-04-28 NOTE — Progress Notes (Deleted)
79 y.o. {MARITAL STATUS:22092} {Race/ethnicity:17218} female G3P3 here for follow up of ****** treated with ******* initiated on {DATE MONTH DAY RUEA:[id number]}. Completed all medication as directed.  Denies any symptoms of**************************.    O: Healthy WD,WN female Affect:  Skin: Abdomen:{Exam; abdomen brief:12273} Pelvic exam:{pe pelvic exam prenatal obgyn:314539}  A:****** Resolved     P: Discussed findings of   Labs   Instructions given regarding:  RV

## 2013-05-02 NOTE — Progress Notes (Signed)
Note reviewed, agree with plan.  Younes Degeorge, MD  

## 2013-05-06 ENCOUNTER — Ambulatory Visit: Payer: Medicare Other | Admitting: Obstetrics & Gynecology

## 2013-05-09 ENCOUNTER — Telehealth: Payer: Self-pay | Admitting: *Deleted

## 2013-05-09 NOTE — Telephone Encounter (Signed)
Call to patient to assist in rescheduling appointment. LMTCB.

## 2013-06-13 NOTE — Telephone Encounter (Signed)
Patient is scheduled for AEX with Dr Sabra Heck 08-16-13. Saw Debbi for pessary check 04-28-13.   To provider for review. Encounter closed.

## 2013-07-26 ENCOUNTER — Encounter: Payer: Self-pay | Admitting: Obstetrics & Gynecology

## 2013-07-26 ENCOUNTER — Telehealth: Payer: Self-pay | Admitting: Obstetrics & Gynecology

## 2013-07-26 NOTE — Telephone Encounter (Signed)
Called Patient to let her know her appt with Dr Sabra Heck on 08/16/13 2:00 for AEX had to be rescheduled to 09/12/13 at 2:00. She also has an appt with Debbi on 08/16/13 at 2:45 for pessary change. This appointment is still on the same day. Patient asked to have appts on the same day. Also sent pt a letter letting her know of the change in her aex appt.

## 2013-08-16 ENCOUNTER — Ambulatory Visit: Payer: Medicare Other | Admitting: Certified Nurse Midwife

## 2013-08-16 ENCOUNTER — Ambulatory Visit: Payer: Medicare Other | Admitting: Obstetrics & Gynecology

## 2013-08-23 ENCOUNTER — Encounter: Payer: Self-pay | Admitting: Obstetrics & Gynecology

## 2013-08-23 ENCOUNTER — Ambulatory Visit (INDEPENDENT_AMBULATORY_CARE_PROVIDER_SITE_OTHER): Payer: Medicare Other | Admitting: Obstetrics & Gynecology

## 2013-08-23 VITALS — BP 120/84 | HR 60 | Resp 16 | Ht 61.25 in | Wt 114.0 lb

## 2013-08-23 DIAGNOSIS — IMO0002 Reserved for concepts with insufficient information to code with codable children: Secondary | ICD-10-CM

## 2013-08-23 DIAGNOSIS — N8111 Cystocele, midline: Secondary | ICD-10-CM

## 2013-08-23 NOTE — Progress Notes (Signed)
80 y.o. G3P3 WidowedCaucasianF here for pessary check.  Doing well.  Used lidocaine today.  No vaginal bleeding.  No vaginal discharge.      Patient's last menstrual period was 05/26/1997.          Sexually active: no  The current method of family planning is none.    Smoker:  no  Health Maintenance: Pap:  11/25/07-WNL MMG:  04/13/12-normal Colonoscopy:  2001-repeat in 10 years, per patient no repeat BMD:   04/13/12 TDaP:  Up to date-PCP Screening Labs: PCP, Hb today: PCP, Urine today: PCP  ROS:  Pertinent items are noted in HPI.  Otherwise, a comprehensive ROS was negative.  Exam:   BP 120/84  Pulse 60  Resp 16  Ht 5' 1.25" (1.556 m)  Wt 114 lb (51.71 kg)  BMI 21.36 kg/m2  LMP 05/26/1997  Weight change:   Height: 5' 1.25" (155.6 cm)  Ht Readings from Last 3 Encounters:  08/23/13 5' 1.25" (1.556 m)  04/28/13 5' 0.75" (1.543 m)  12/17/12 5' 0.75" (1.543 m)    General appearance: alert, cooperative and appears stated age Head: Normocephalic, without obvious abnormality, atraumatic Breasts: normal appearance, no masses or tenderness No abnormal inguinal nodes palpated Neurologic: Grossly normal   Pelvic: External genitalia:  no lesions              Urethra:  normal appearing urethra with no masses, tenderness or lesions              Bartholins and Skenes: normal                 Vagina: normal appearing vagina with normal color and discharge, no lesions              Cervix: no lesions              Pap taken: no Bimanual Exam:  Uterus:  normal size, contour, position, consistency, mobility, non-tender              Adnexa: normal adnexa and no mass, fullness, tenderness               Rectovaginal: Confirms               Anus:  normal sphincter tone, no lesions  Pessary removed, cleansed, and replaced after exam complete  A:  4th degree cystocele with pessary use  P:   Return four months.  An After Visit Summary was printed and given to the patient.

## 2013-09-12 ENCOUNTER — Ambulatory Visit: Payer: Medicare Other | Admitting: Obstetrics & Gynecology

## 2013-10-06 ENCOUNTER — Encounter: Payer: Self-pay | Admitting: Obstetrics & Gynecology

## 2013-12-13 ENCOUNTER — Ambulatory Visit: Payer: Medicare Other | Admitting: Obstetrics & Gynecology

## 2013-12-20 ENCOUNTER — Ambulatory Visit: Payer: Medicare Other | Admitting: Obstetrics & Gynecology

## 2014-01-10 ENCOUNTER — Encounter: Payer: Self-pay | Admitting: Certified Nurse Midwife

## 2014-01-10 ENCOUNTER — Ambulatory Visit (INDEPENDENT_AMBULATORY_CARE_PROVIDER_SITE_OTHER): Payer: Medicare Other | Admitting: Certified Nurse Midwife

## 2014-01-10 ENCOUNTER — Ambulatory Visit: Payer: Medicare Other | Admitting: Obstetrics & Gynecology

## 2014-01-10 VITALS — BP 120/78 | HR 74 | Resp 16 | Ht 60.75 in | Wt 117.0 lb

## 2014-01-10 DIAGNOSIS — Z4689 Encounter for fitting and adjustment of other specified devices: Secondary | ICD-10-CM

## 2014-01-10 NOTE — Progress Notes (Signed)
80 y.o. WidowedWhite female G3P3 here for pessary check.  Patient has been using following pessary style and size:  #4 Ring pessary with support.  She is not sexually active.  She describes the following issues with the pessary:  None.Patient has not being using any vaginal cream or moisturizer, prefers none and has had no problems without use.  ROS: no vaginal bleeding, no dysuria, trouble voiding or hematuria  Exam:   BP 120/78  Pulse 74  Resp 16  Ht 5' 0.75" (1.543 m)  Wt 117 lb (53.071 kg)  BMI 22.29 kg/m2  LMP 05/26/1997 General appearance: alert, cooperative, appears stated age and ambulates without assistance no inguinal lymph node enlargement or tenderness   Pelvic: External genitalia:  no lesions and atrophic appearance              Urethra: not indicated and normal appearing urethra with no masses, tenderness or lesions              Bartholins and Skenes: Bartholin's, Urethra, Skene's normal, bladder, not tender                 Vagina: atrophic, appearing vagina with no ulcerations or excoriation noted, moisture noted, cystocele noted grade 4, pessary noted in place, removed              Cervix: normal appearance and no ulcerations or excoriation noted Bimanual Exam:  Uterus:  Small, normal shape, non tender                               Adnexa:    normal adnexa in size, nontender and no masses                               Anus:  defer exam  Pessary was removed without difficulty.  Pessary was cleansed .  Pessary was replaced with KY jelly as lubricant. . Patient tolerated procedure well.Patient ambulated,sat and voided without problems. Patient felt pessary in "right place and no pain with removal or insertion.".    A: Cystocele- symptomatic grade 4 with ring pessary with support use with good response.      Atrophic vaginitis encouraged moisturizer use if feeling dry.  P:  Return to office in 3 months for recheck.       Warning signs of pessary given and need to advise if  occurs.    An After Visit Summary was printed and given to the patient.

## 2014-01-10 NOTE — Patient Instructions (Signed)
Call if any vaginal bleeding or problems  Good to see you. Debbi

## 2014-01-11 NOTE — Progress Notes (Signed)
Reviewed personally.  M. Suzanne Passion Lavin, MD.  

## 2014-01-20 ENCOUNTER — Ambulatory Visit: Payer: Medicare Other | Admitting: Obstetrics & Gynecology

## 2014-03-27 ENCOUNTER — Encounter: Payer: Self-pay | Admitting: Certified Nurse Midwife

## 2014-04-25 ENCOUNTER — Telehealth: Payer: Self-pay | Admitting: Certified Nurse Midwife

## 2014-04-25 ENCOUNTER — Ambulatory Visit: Payer: Medicare Other | Admitting: Certified Nurse Midwife

## 2014-04-25 NOTE — Telephone Encounter (Signed)
Left message regarding missed appointment today.

## 2014-04-27 ENCOUNTER — Encounter: Payer: Self-pay | Admitting: Certified Nurse Midwife

## 2014-04-27 ENCOUNTER — Ambulatory Visit (INDEPENDENT_AMBULATORY_CARE_PROVIDER_SITE_OTHER): Payer: Medicare Other | Admitting: Certified Nurse Midwife

## 2014-04-27 VITALS — BP 110/70 | HR 68 | Resp 16 | Ht 60.75 in | Wt 116.0 lb

## 2014-04-27 DIAGNOSIS — Z4689 Encounter for fitting and adjustment of other specified devices: Secondary | ICD-10-CM

## 2014-04-27 DIAGNOSIS — IMO0002 Reserved for concepts with insufficient information to code with codable children: Secondary | ICD-10-CM

## 2014-04-27 DIAGNOSIS — N8110 Cystocele, unspecified: Secondary | ICD-10-CM

## 2014-04-27 NOTE — Progress Notes (Signed)
80 y.o. Widowed White female G3P3 here for pessary check.  Patient has been using following pessary style and size:  #4 ring pessary with support..  She is not sexually active.  She describes the following issues with the pessary,none. Denies any vaginal, or urinary or bowel issues..  ROS: no vaginal bleeding, no discharge or pelvic pain, no dysuria, trouble voiding or hematuria  Exam:   BP 110/70 mmHg  Pulse 68  Resp 16  Ht 5' 0.75" (1.543 m)  Wt 116 lb (52.617 kg)  BMI 22.10 kg/m2  LMP 05/26/1997 General appearance: alert, cooperative and appears stated age No inguinal node enlargement or tenderness   Pelvic: External genitalia:  no lesions and atrophic appearance              Urethra: not indicated and normal appearing urethra with no masses, tenderness or lesions              Bartholins and Skenes: Bartholin's, Urethra, Skene's normal                 Vagina: atrophic appearance, no ulcerations or excoriations noted cystocele grade 2              Cervix: normal appearance Bimanual Exam:  Uterus:  uterus is normal size, shape, consistency and nontender                               Adnexa:    normal adnexa in size, nontender and no masses                               Anus:  defer exam  Pessary was removed without difficulty.  Pessary was cleansed.  Pessary was replaced. Patient tolerated procedure well.  Patient ambulated, voided and sat with pessary remaining in place.   A: Cystocele- symptomatic with Pessary support, no contraindications to use.        P:  Return to office in 3 months for recheck. Warning signs given and need to advise.         An After Visit Summary was printed and given to the patient.

## 2014-05-03 NOTE — Progress Notes (Signed)
Reviewed personally.  M. Suzanne Adaysha Dubinsky, MD.  

## 2014-05-05 ENCOUNTER — Telehealth: Payer: Self-pay

## 2014-05-05 NOTE — Telephone Encounter (Signed)
Called patient to see if her pcp had given her bmd results yet.

## 2014-05-08 ENCOUNTER — Telehealth: Payer: Self-pay | Admitting: Obstetrics & Gynecology

## 2014-05-08 NOTE — Telephone Encounter (Signed)
Pt notified of BMD results.

## 2014-05-08 NOTE — Telephone Encounter (Signed)
Pt states she was returning phone call from [place].  No telephone note open, but saw communication within pts chart. Please call pt.  Bf

## 2014-05-08 NOTE — Telephone Encounter (Signed)
Pt notified of results. Pt states she also got her mammo results that said it was normal but breast were dense. She just wanted to let you know.See BMD scanned results

## 2014-08-03 ENCOUNTER — Ambulatory Visit: Payer: Medicare Other | Admitting: Certified Nurse Midwife

## 2014-08-08 ENCOUNTER — Encounter: Payer: Self-pay | Admitting: Certified Nurse Midwife

## 2014-08-08 ENCOUNTER — Ambulatory Visit (INDEPENDENT_AMBULATORY_CARE_PROVIDER_SITE_OTHER): Payer: Medicare Other | Admitting: Certified Nurse Midwife

## 2014-08-08 VITALS — BP 118/68 | HR 70 | Resp 16 | Ht 65.75 in | Wt 116.0 lb

## 2014-08-08 DIAGNOSIS — Z4689 Encounter for fitting and adjustment of other specified devices: Secondary | ICD-10-CM | POA: Diagnosis not present

## 2014-08-08 NOTE — Progress Notes (Signed)
81 y.o. WidowedWhite female G3P3 here for pessary check.  Patient has been using following pessary style and size:  #4 ring pessary with support..  She is not sexually active.  She describes the following issues with the pessary:slightly slower emptying of bowel, but no pain, or bleeding or inability to empty bowel. Patient will be moving into [place] in early 2017. Does not want to be a burden on her children. Feels she will be less lonely, also. No other health issues today. Sees PCP for aex and breast exam.  ROS: no vaginal bleeding, no discharge or pelvic pain, no dysuria, trouble voiding or hematuria  Exam:   BP 118/68 mmHg  Pulse 70  Resp 16  Ht 5' 5.75" (1.67 m)  Wt 116 lb (52.617 kg)  BMI 18.87 kg/m2  LMP 05/26/1997 General appearance: alert, cooperative, appears stated age, no distress and ambulates with no assistance Cervical, supraclavicular, and axillary nodes normal. and no inguinal lymph node enlargement pr tenderness   Pelvic: External genitalia:  atrophic appearance              Urethra: not indicated              Bartholins and Skenes: Bartholin's, Urethra, Skene's normal                 Vagina: atrophic appearing vagina, no excoriations or ulcerations noted, cystocele grade 2 noted              Cervix: normal appearance and no ulceration or excoriation noted Bimanual Exam:  Uterus:  uterus is normal size, shape, consistency and nontender                               Adnexa:    normal adnexa in size, nontender and no masses                               Anus:  normal sphincter tone, no lesions  Pessary was removed without difficulty.  Pessary was cleansed.  Pessary was replaced. Patient tolerated procedure well.  Patient ambulated, sat and voided without problems.   A: Normal pelvic exam Cystocele symptomatic with Pessary support, no contraindications to use. Atrophic vaginitis   P: Discussed finding of exam and pessary working well. Discussed using coconut oil  vaginally 2 x weekly for moisture, patient feels this is feasible and will start after obtaining. Instructions and warning signs given with pessary use and need to advise.  Return to office in 3 months for recheck.       An After Visit Summary was printed and given to the patient.

## 2014-08-09 NOTE — Progress Notes (Signed)
Reviewed personally.  M. Suzanne Synthia Fairbank, MD.  

## 2014-08-22 ENCOUNTER — Other Ambulatory Visit (HOSPITAL_COMMUNITY): Payer: Self-pay | Admitting: Internal Medicine

## 2014-08-24 ENCOUNTER — Ambulatory Visit (HOSPITAL_COMMUNITY): Admission: RE | Admit: 2014-08-24 | Payer: Medicare Other | Source: Ambulatory Visit

## 2014-09-22 ENCOUNTER — Ambulatory Visit (HOSPITAL_COMMUNITY): Admission: RE | Admit: 2014-09-22 | Payer: Medicare Other | Source: Ambulatory Visit

## 2014-10-13 ENCOUNTER — Emergency Department (HOSPITAL_COMMUNITY): Payer: Medicare Other

## 2014-10-13 ENCOUNTER — Emergency Department (HOSPITAL_COMMUNITY)
Admission: EM | Admit: 2014-10-13 | Discharge: 2014-10-13 | Disposition: A | Discharge status: Home / Self Care | Payer: Medicare Other | Attending: Emergency Medicine | Admitting: Emergency Medicine

## 2014-10-13 ENCOUNTER — Telehealth: Payer: Self-pay | Admitting: Internal Medicine

## 2014-10-13 ENCOUNTER — Encounter (HOSPITAL_COMMUNITY): Payer: Self-pay | Admitting: Emergency Medicine

## 2014-10-13 DIAGNOSIS — R630 Anorexia: Secondary | ICD-10-CM | POA: Insufficient documentation

## 2014-10-13 DIAGNOSIS — Z85828 Personal history of other malignant neoplasm of skin: Secondary | ICD-10-CM | POA: Diagnosis not present

## 2014-10-13 DIAGNOSIS — Z87448 Personal history of other diseases of urinary system: Secondary | ICD-10-CM | POA: Insufficient documentation

## 2014-10-13 DIAGNOSIS — Z87440 Personal history of urinary (tract) infections: Secondary | ICD-10-CM | POA: Insufficient documentation

## 2014-10-13 DIAGNOSIS — E860 Dehydration: Secondary | ICD-10-CM | POA: Diagnosis not present

## 2014-10-13 DIAGNOSIS — Z79899 Other long term (current) drug therapy: Secondary | ICD-10-CM | POA: Diagnosis not present

## 2014-10-13 DIAGNOSIS — Z8739 Personal history of other diseases of the musculoskeletal system and connective tissue: Secondary | ICD-10-CM | POA: Insufficient documentation

## 2014-10-13 DIAGNOSIS — Z880 Allergy status to penicillin: Secondary | ICD-10-CM | POA: Diagnosis not present

## 2014-10-13 DIAGNOSIS — I10 Essential (primary) hypertension: Secondary | ICD-10-CM | POA: Insufficient documentation

## 2014-10-13 DIAGNOSIS — E871 Hypo-osmolality and hyponatremia: Secondary | ICD-10-CM | POA: Diagnosis not present

## 2014-10-13 DIAGNOSIS — R110 Nausea: Secondary | ICD-10-CM

## 2014-10-13 DIAGNOSIS — K2970 Gastritis, unspecified, without bleeding: Secondary | ICD-10-CM | POA: Insufficient documentation

## 2014-10-13 HISTORY — DX: Unspecified asthma, uncomplicated: J45.909

## 2014-10-13 LAB — CBC WITH DIFFERENTIAL/PLATELET
Basophils Absolute: 0 10*3/uL (ref 0.0–0.1)
Basophils Relative: 0 % (ref 0–1)
Eosinophils Absolute: 0 10*3/uL (ref 0.0–0.7)
Eosinophils Relative: 1 % (ref 0–5)
HCT: 45.6 % (ref 36.0–46.0)
Hemoglobin: 15.5 g/dL — ABNORMAL HIGH (ref 12.0–15.0)
LYMPHS ABS: 2 10*3/uL (ref 0.7–4.0)
LYMPHS PCT: 31 % (ref 12–46)
MCH: 31.5 pg (ref 26.0–34.0)
MCHC: 34 g/dL (ref 30.0–36.0)
MCV: 92.7 fL (ref 78.0–100.0)
MONO ABS: 0.8 10*3/uL (ref 0.1–1.0)
Monocytes Relative: 12 % (ref 3–12)
Neutro Abs: 3.8 10*3/uL (ref 1.7–7.7)
Neutrophils Relative %: 56 % (ref 43–77)
PLATELETS: 298 10*3/uL (ref 150–400)
RBC: 4.92 MIL/uL (ref 3.87–5.11)
RDW: 12.4 % (ref 11.5–15.5)
WBC: 6.6 10*3/uL (ref 4.0–10.5)

## 2014-10-13 LAB — URINALYSIS, ROUTINE W REFLEX MICROSCOPIC
BILIRUBIN URINE: NEGATIVE
Glucose, UA: NEGATIVE mg/dL
KETONES UR: 15 mg/dL — AB
NITRITE: NEGATIVE
PROTEIN: NEGATIVE mg/dL
Specific Gravity, Urine: 1.006 (ref 1.005–1.030)
UROBILINOGEN UA: 0.2 mg/dL (ref 0.0–1.0)
pH: 7 (ref 5.0–8.0)

## 2014-10-13 LAB — COMPREHENSIVE METABOLIC PANEL
ALBUMIN: 4.2 g/dL (ref 3.5–5.0)
ALT: 17 U/L (ref 14–54)
AST: 28 U/L (ref 15–41)
Alkaline Phosphatase: 44 U/L (ref 38–126)
Anion gap: 15 (ref 5–15)
BUN: 12 mg/dL (ref 6–20)
CHLORIDE: 89 mmol/L — AB (ref 101–111)
CO2: 26 mmol/L (ref 22–32)
Calcium: 9.6 mg/dL (ref 8.9–10.3)
Creatinine, Ser: 0.81 mg/dL (ref 0.44–1.00)
GFR calc Af Amer: >60 mL/min (ref >60)
GFR calc non Af Amer: >60 mL/min (ref >60)
Glucose, Bld: 116 mg/dL — ABNORMAL HIGH (ref 65–99)
POTASSIUM: 3.3 mmol/L — AB (ref 3.5–5.1)
SODIUM: 130 mmol/L — AB (ref 135–145)
Total Bilirubin: 0.8 mg/dL (ref 0.3–1.2)
Total Protein: 7.4 g/dL (ref 6.5–8.1)

## 2014-10-13 LAB — LIPASE, BLOOD: Lipase: 30 U/L (ref 22–51)

## 2014-10-13 LAB — I-STAT CHEM 8, ED
BUN: 13 mg/dL (ref 6–20)
CALCIUM ION: 1.07 mmol/L — AB (ref 1.13–1.30)
CREATININE: 0.9 mg/dL (ref 0.44–1.00)
Chloride: 92 mmol/L — ABNORMAL LOW (ref 101–111)
GLUCOSE: 131 mg/dL — AB (ref 65–99)
HEMATOCRIT: 50 % — AB (ref 36.0–46.0)
Hemoglobin: 17 g/dL — ABNORMAL HIGH (ref 12.0–15.0)
POTASSIUM: 3.5 mmol/L (ref 3.5–5.1)
Sodium: 131 mmol/L — ABNORMAL LOW (ref 135–145)
TCO2: 25 mmol/L (ref 0–100)

## 2014-10-13 LAB — URINE MICROSCOPIC-ADD ON

## 2014-10-13 MED ORDER — IOHEXOL 300 MG/ML  SOLN
80.0000 mL | Freq: Once | INTRAMUSCULAR | Status: AC | PRN
  Administered 2014-10-13: 80 mL via INTRAVENOUS

## 2014-10-13 MED ORDER — SODIUM CHLORIDE 0.9 % IV BOLUS (SEPSIS)
1000.0000 mL | Freq: Once | INTRAVENOUS | Status: AC
  Administered 2014-10-13: 1000 mL via INTRAVENOUS

## 2014-10-13 MED ORDER — METOCLOPRAMIDE HCL 5 MG/ML IJ SOLN
10.0000 mg | Freq: Once | INTRAMUSCULAR | Status: AC
  Administered 2014-10-13: 10 mg via INTRAVENOUS
  Filled 2014-10-13: qty 2

## 2014-10-13 MED ORDER — ONDANSETRON 4 MG PO TBDP
8.0000 mg | ORAL_TABLET | Freq: Once | ORAL | Status: AC
  Administered 2014-10-13: 8 mg via ORAL

## 2014-10-13 MED ORDER — IOHEXOL 300 MG/ML  SOLN
25.0000 mL | INTRAMUSCULAR | Status: AC
  Administered 2014-10-13 (×2): 25 mL via ORAL

## 2014-10-13 MED ORDER — ONDANSETRON 4 MG PO TBDP
ORAL_TABLET | ORAL | Status: AC
  Filled 2014-10-13: qty 2

## 2014-10-13 NOTE — ED Notes (Signed)
Patient transported to CT 

## 2014-10-13 NOTE — Telephone Encounter (Signed)
Patient booked. Unable to reach [place].

## 2014-10-13 NOTE — ED Notes (Signed)
MD at bedside. 

## 2014-10-13 NOTE — Telephone Encounter (Signed)
I will see her this Tue at 1.00 pm. DB

## 2014-10-13 NOTE — ED Notes (Signed)
Dr. Oni at bedside. 

## 2014-10-13 NOTE — ED Provider Notes (Signed)
CSN: [id number]     Arrival date & time 10/13/14  0112 History  This chart was scribed for Everlene Balls, MD by Evelene Croon, ED Scribe. This patient was seen in room D31C/D31C and the patient's care was started 2:26 AM.    Chief Complaint  Patient presents with  . Nausea    The history is provided by the patient. No language interpreter was used.    HPI Comments:  LAYNEE LOCKAMY is a 81 y.o. female who presents to the Emergency Department complaining of intermittent nausea for 10 days. Her nausea is exacerbated whenever she tries to eat. She reports associated decreased PO intake; states she believes she is dehydrated. She denies vomiting, fever, chills, and abdominal pain. She also denies h/o similar symptoms.She has taken zofran without relief.  Pt recently had [place] of abdomen that ruled out gallbladder issues.     Past Medical History  Diagnosis Date  . Osteoporosis   . Cystocele 08/2011  . Melanoma 11/2001  . Hypertension   . Hematuria     GU workup  . UTI (urinary tract infection)     septic   Past Surgical History  Procedure Laterality Date  . Hysteroscopy  06/1998    D&C-Benign  . Melanoma excision     Family History  Problem Relation Age of Onset  . Hypertension Father   . Heart attack Father    History  Substance Use Topics  . Smoking status: Never Smoker   . Smokeless tobacco: Never Used  . Alcohol Use: No   OB History    Gravida Para Term Preterm AB TAB SAB Ectopic Multiple Living   3 3        3      Review of Systems  Constitutional: Positive for appetite change. Negative for fever and chills.  Gastrointestinal: Positive for nausea. Negative for vomiting and abdominal pain.  All other systems reviewed and are negative.     Allergies  Penicillins and Pneumococcal vaccines  Home Medications   Prior to Admission medications   Medication Sig Start Date End Date Taking? Authorizing Provider  losartan (COZAAR) 50 MG tablet Take 50 mg by mouth daily.    Yes Historical Provider, MD  Multiple Vitamin (MULTIVITAMIN WITH MINERALS) TABS Take 1 tablet by mouth daily.   Yes Historical Provider, MD  ondansetron (ZOFRAN-ODT) 4 MG disintegrating tablet Take 4 mg by mouth every 8 (eight) hours as needed for nausea or vomiting.   Yes Historical Provider, MD  pantoprazole (PROTONIX) 40 MG tablet Take 1 tablet (40 mg total) by mouth daily at 6 (six) AM. 04/30/12  Yes Burnard Bunting, MD  zolpidem (AMBIEN) 10 MG tablet Take 10 mg by mouth at bedtime as needed. For sleep   Yes Historical Provider, MD  lidocaine (XYLOCAINE) 5 % ointment Apply topically as needed. Apply about one hour before next appointment Patient not taking: Reported on 10/13/2014 08/18/12   Megan Salon, MD   BP 176/95 mmHg  Pulse 73  Temp(Src) 97.8 F (36.6 C) (Oral)  Resp 14  Ht 5\' 2"  (1.575 m)  Wt 102 lb 6 oz (46.437 kg)  BMI 18.72 kg/m2  SpO2 96%  LMP 05/26/1997 Physical Exam  Constitutional: She is oriented to person, place, and time. She appears well-developed and well-nourished. No distress.  HENT:  Head: Normocephalic and atraumatic.  Nose: Nose normal.  Mouth/Throat: No oropharyngeal exudate.  Dry oropharynx  Eyes: Conjunctivae and EOM are normal. Pupils are equal, round, and reactive to light.  No scleral icterus.  Neck: Normal range of motion. Neck supple. No JVD present. No tracheal deviation present. No thyromegaly present.  Cardiovascular: Normal rate, regular rhythm and normal heart sounds.  Exam reveals no gallop and no friction rub.   No murmur heard. Pulmonary/Chest: Effort normal and breath sounds normal. No respiratory distress. She has no wheezes. She exhibits no tenderness.  Abdominal: Soft. Bowel sounds are normal. She exhibits no distension and no mass. There is no tenderness. There is no rebound and no guarding.  Musculoskeletal: Normal range of motion. She exhibits no edema or tenderness.  Lymphadenopathy:    She has no cervical adenopathy.   Neurological: She is alert and oriented to person, place, and time. No cranial nerve deficit. She exhibits normal muscle tone.  Skin: Skin is warm and dry. No rash noted. No erythema. No pallor.  Nursing note and vitals reviewed.   ED Course  Procedures   DIAGNOSTIC STUDIES:  Oxygen Saturation is 95% on RA, adequate by my interpretation.    COORDINATION OF CARE:  2:30 AM Will order CT A/P. Discussed treatment plan with pt and daughter at bedside and they agreed to plan.  Labs Review Labs Reviewed  CBC WITH DIFFERENTIAL/PLATELET - Abnormal; Notable for the following:    Hemoglobin 15.5 (*)    All other components within normal limits  URINALYSIS, ROUTINE W REFLEX MICROSCOPIC - Abnormal; Notable for the following:    Hgb urine dipstick TRACE (*)    Ketones, ur 15 (*)    Leukocytes, UA TRACE (*)    All other components within normal limits  COMPREHENSIVE METABOLIC PANEL - Abnormal; Notable for the following:    Sodium 130 (*)    Potassium 3.3 (*)    Chloride 89 (*)    Glucose, Bld 116 (*)    All other components within normal limits  I-STAT CHEM 8, ED - Abnormal; Notable for the following:    Sodium 131 (*)    Chloride 92 (*)    Glucose, Bld 131 (*)    Calcium, Ion 1.07 (*)    Hemoglobin 17.0 (*)    HCT 50.0 (*)    All other components within normal limits  LIPASE, BLOOD  URINE MICROSCOPIC-ADD ON    Imaging Review Ct Abdomen Pelvis W Contrast  10/13/2014   CLINICAL DATA:  Intermittent nausea for 10 days, worse with eating. Dehydration.  EXAM: CT ABDOMEN AND PELVIS WITH CONTRAST  TECHNIQUE: Multidetector CT imaging of the abdomen and pelvis was performed using the standard protocol following bolus administration of intravenous contrast.  CONTRAST:  20mL OMNIPAQUE IOHEXOL 300 MG/ML  SOLN  COMPARISON:  Abdominal ultrasound April 27, 2012  FINDINGS: LUNG BASES: Included view of the lung bases are clear. Visualized heart and pericardium are unremarkable.  SOLID ORGANS: The  liver demonstrates focal fatty infiltration about the falciform ligament, mild intrahepatic biliary dilatation LEFT lobe. Gallbladder is mildly distended without cholelithiasis or CT findings of acute cholecystitis. Spleen, pancreas and adrenal glands are unremarkable.  GASTROINTESTINAL TRACT: Moderate hiatal hernia. Mild gastric antral wall thickening. The small and large bowel are normal in course and caliber without inflammatory changes. Normal appendix.  KIDNEYS/ URINARY TRACT: Kidneys are orthotopic though LEFT kidney is mildly malrotated and a trophic, demonstrating symmetric enhancement. 1 cm cyst upper pole with the RIGHT kidney. Too small to characterize hypodensities in the kidneys bilaterally. No nephrolithiasis, hydronephrosis or solid renal masses. The unopacified ureters are normal in course and caliber. Delayed imaging through the kidneys demonstrates symmetric  prompt contrast excretion within the proximal urinary collecting system. Urinary bladder is partially distended and unremarkable.  PERITONEUM/RETROPERITONEUM: Aortoiliac vessels are normal in course and caliber, moderate calcific atherosclerosis. No lymphadenopathy by CT size criteria. Internal reproductive organs are normal, pessary in place. No intraperitoneal free fluid nor free air.  SOFT TISSUE/OSSEOUS STRUCTURES: Non-suspicious. Severe thoracolumbar levoscoliosis and degenerative spine.  IMPRESSION: Gastric antral wall thickening can be seen with gastritis. Moderate hiatal hernia.  Distended gallbladder, without CT findings of acute cholecystitis. Minimal intrahepatic biliary dilatation.  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   Electronically Signed   By: Elon Alas   On: 10/13/2014 04:09     EKG Interpretation   Date/Time:  Friday Oct 13 2014 02:39:10 EDT Ventricular Rate:  75 PR Interval:    QRS Duration: 69 QT Interval:  390 QTC Calculation: 436 R Axis:   6 Text Interpretation:  Normal  sinus rhythm Probable anteroseptal infarct,  old Baseline wander in lead(s) V2 V3 V5 No significant change since last  tracing Confirmed by Glynn Octave [phone]) on 10/13/2014 3:31:01 AM      MDM   Final diagnoses:  Nausea   Patient presents to the ED for nausea x 1 week and now feels dehydrated.  Labs shows mild dehydration with low sodium and chloride.  She received 1L IVF bolus.  Pt has MRI scheduled of abdomen for further diagnostics with her PCP.  I will obtain CT scan to eval for any pathology while she is in the ED.  She was given reglan and this helped her symptoms greatly.  CT scan shows only gastritis and a hiatal hernia.  This may be causing some of her nausea symptoms.  She was advised to keep appt with her PCP, she was monitored overnight without any recurrence of her symptoms. She was found sleeping in the room comfortably in NAD.  Her VS remain within her normal limits and she is safe for DC.   I personally performed the services described in this documentation, which was scribed in my presence. The recorded information has been reviewed and is accurate.    Everlene Balls, MD 10/13/14 [phone]

## 2014-10-13 NOTE — ED Notes (Signed)
Pt has had nausea x's 10 days, no vomiting but has not eate  Anything.  No diarrhea, pt st's she is dehydrated

## 2014-10-13 NOTE — Discharge Instructions (Signed)
Gastritis, Adult Michele [name], your CT results are below and show some gastritis.  This may be causing your nausea.  Take zantac and zofran at home for relief and see your primary physician within 3 days for close follow up.  If symptoms worsen, come back to the ED immediately. Thank you.  IMPRESSION: Gastric antral wall thickening can be seen with gastritis. Moderate hiatal hernia.  Distended gallbladder, without CT findings of acute cholecystitis. Minimal intrahepatic biliary dilatation.  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  Gastritis is soreness and puffiness (inflammation) of the lining of the stomach. If you do not get help, gastritis can cause bleeding and sores (ulcers) in the stomach. HOME CARE   Only take medicine as told by your doctor.  If you were given antibiotic medicines, take them as told. Finish the medicines even if you start to feel better.  Drink enough fluids to keep your pee (urine) clear or pale yellow.  Avoid foods and drinks that make your problems worse. Foods you may want to avoid include:  Caffeine or alcohol.  Chocolate.  Mint.  Garlic and onions.  Spicy foods.  Citrus fruits, including oranges, lemons, or limes.  Food containing tomatoes, including sauce, chili, salsa, and pizza.  Fried and fatty foods.  Eat small meals throughout the day instead of large meals. GET HELP RIGHT AWAY IF:   You have black or dark red poop (stools).  You throw up (vomit) blood. It may look like coffee grounds.  You cannot keep fluids down.  Your belly (abdominal) pain gets worse.  You have a fever.  You do not feel better after 1 week.  You have any other questions or concerns. MAKE SURE YOU:   Understand these instructions.  Will watch your condition.  Will get help right away if you are not doing well or get worse. Document Released: 10/29/2007 Document Revised: 08/04/2011 Document Reviewed:  06/25/2011 [facility] Patient Information 2015 [place], [place]. This information is not intended to replace advice given to you by your health care provider. Make sure you discuss any questions you have with your health care provider. Nausea, Adult Nausea means you feel sick to your stomach or need to throw up (vomit). It may be a sign of a more serious problem. If nausea gets worse, you may throw up. If you throw up a lot, you may lose too much body fluid (dehydration). HOME CARE   Get plenty of rest.  Ask your doctor how to replace body fluid losses (rehydrate).  Eat small amounts of food. Sip liquids more often.  Take all medicines as told by your doctor. GET HELP RIGHT AWAY IF:  You have a fever.  You pass out (faint).  You keep throwing up or have blood in your throw up.  You are very weak, have dry lips or a dry mouth, or you are very thirsty (dehydrated).  You have dark or bloody poop (stool).  You have very bad chest or belly (abdominal) pain.  You do not get better after 2 days, or you get worse.  You have a headache. MAKE SURE YOU:  Understand these instructions.  Will watch your condition.  Will get help right away if you are not doing well or get worse. Document Released: 05/01/2011 Document Revised: 08/04/2011 Document Reviewed: 05/01/2011 [facility] Patient Information 2015 [place], [place]. This information is not intended to replace advice given to you by your health care provider. Make sure you discuss any questions you have with your  health care provider. ° °

## 2014-10-13 NOTE — Telephone Encounter (Signed)
Peter Congo aware and will call patient.

## 2014-10-13 NOTE — ED Notes (Signed)
Held lab specimens sent to main lab for processing

## 2014-10-13 NOTE — Telephone Encounter (Signed)
Spoke with Peter Congo and informed her I have no OV today with MD or extender. Gave her earliest available appointment on 10/20/14 at 2:15 PM with Cecille Rubin Hvozdovic, PA-C

## 2014-10-16 ENCOUNTER — Encounter (HOSPITAL_COMMUNITY): Payer: Medicare Other

## 2014-10-16 ENCOUNTER — Encounter: Payer: Self-pay | Admitting: *Deleted

## 2014-10-17 ENCOUNTER — Encounter: Payer: Self-pay | Admitting: Internal Medicine

## 2014-10-17 ENCOUNTER — Ambulatory Visit (INDEPENDENT_AMBULATORY_CARE_PROVIDER_SITE_OTHER): Payer: Medicare Other | Admitting: Internal Medicine

## 2014-10-17 VITALS — BP 120/88 | HR 64 | Ht 62.0 in | Wt 109.4 lb

## 2014-10-17 DIAGNOSIS — R110 Nausea: Secondary | ICD-10-CM

## 2014-10-17 DIAGNOSIS — R109 Unspecified abdominal pain: Secondary | ICD-10-CM

## 2014-10-17 DIAGNOSIS — R933 Abnormal findings on diagnostic imaging of other parts of digestive tract: Secondary | ICD-10-CM | POA: Diagnosis not present

## 2014-10-17 NOTE — Progress Notes (Signed)
Michele [name] 08-15-1926 [id number]  Note: This dictation was prepared with Dragon digital system. Any transcriptional errors that result from this procedure are unintentional.   History of Present Illness: This is a 81 year old white female seen in emergency department last week for dehydration,, complaining of 10 days of nausea and decreased food intake. There was no vomiting. Patient reported 10 pound weight loss. She was 116 pounds in December 2015 and 102 pounds last week. We have seen her in the past for colorectal screening. Colonoscopy in December 2001 showed tortuous colon with scattered diverticuli. She received a recall letter in 2011 but did not respond. Upper abdominal ultrasound in 2013 showed  common bile duct of 4 mm. Normal sized spleen of 8 cm. Repeat ultrasound recently was also normal. CT scan of the abdomen emergency room last week showed hiatal hernia, gastric antral thickening and moderate size hiatal hernia. She also had atrophic left kidney which is  partially malrotated. Gallbladder was  distended with mild intrahepatic duct dilatation. Her liver function tests were normal. She got better after IV fluids but has deteriorated again this past weekend. She comes today with her daughter. Patient is supposed to attend a wedding of her granddaughters coming weekend    Past Medical History  Diagnosis Date  . Osteoporosis   . Cystocele 08/2011  . Melanoma 11/2001  . Hypertension   . Hematuria     GU workup  . UTI (urinary tract infection)     septic  . Asthma   . Hiatal hernia   . Diverticulosis   . Osteoarthritis   . GERD (gastroesophageal reflux disease)   . Scoliosis   . COPD (chronic obstructive pulmonary disease)     Past Surgical History  Procedure Laterality Date  . Hysteroscopy  06/1998    D&C-Benign  . Melanoma excision      Allergies  Allergen Reactions  . Penicillins Other (See Comments)    Reaction unknown  . Pneumococcal Vaccines Other (See  Comments)    Reaction unknown    Family history and social history have been reviewed.  Review of Systems: Decreased energy. Lightheadedness. Denies abdominal pain. Denies diarrhea  The remainder of the 10 point ROS is negative except as outlined in the H&P  Physical Exam: General Appearance thin ,frail appearing, in no distress Eyes  Non icteric  HEENT  Non traumatic, normocephalic  Mouth No lesion, tongue papillated, no cheilosis Neck Supple without adenopathy, thyroid not enlarged, no carotid bruits, no JVD Lungs Clear to auscultation bilaterally COR Normal S1, normal S2, regular rhythm, no murmur, quiet precordium Abdomen scaphoid. Soft. Nontender. No tympany. Normoactive bowel sounds. No tenderness Rectal soft Hemoccult-negative stool Extremities  No pedal edema Skin No lesions Neurological Alert and oriented x 3 Psychological Normal mood and affect  Assessment and Plan:   81 year old white female frail appearing. She has had 2 weeks history of nausea without vomiting a and she feels dizzy when she stands up. She has been taking Cozaar, blood pressure medications. Her blood pressure today 120/90. She will hold it for next 2 days and push oral liquids. There is an abnormality of the gastric antrum on CT scan of the abdomen which may  reflect gastritis or possibly cancer. There is no sign of gastric outlet obstruction. She will continue to take Zofran. She will undergo upper endoscopy with Propofol sedation tomorrow and biopsy to rule out H. pylori. Continue pantoprazole 40 mg daily and Zofran 4 mg when necessary    Delfin Edis  10/17/2014      

## 2014-10-17 NOTE — Patient Instructions (Addendum)
You have been scheduled for an endoscopy. Please follow written instructions given to you at your visit today. If you use inhalers (even only as needed), please bring them with you on the day of your procedure. Your physician has requested that you go to www.[email or web address] and enter the access code given to you at your visit today. This web site gives a general overview about your procedure. However, you should still follow specific instructions given to you by our office regarding your preparation for the procedure.  Dr Reynaldo Minium

## 2014-10-18 ENCOUNTER — Ambulatory Visit (AMBULATORY_SURGERY_CENTER): Payer: Medicare Other | Admitting: Internal Medicine

## 2014-10-18 ENCOUNTER — Telehealth: Payer: Self-pay | Admitting: *Deleted

## 2014-10-18 ENCOUNTER — Encounter: Payer: Self-pay | Admitting: Internal Medicine

## 2014-10-18 ENCOUNTER — Encounter: Payer: Self-pay | Admitting: *Deleted

## 2014-10-18 VITALS — BP 151/93 | HR 69 | Temp 98.2°F | Resp 24 | Ht 62.0 in | Wt 109.0 lb

## 2014-10-18 DIAGNOSIS — K2990 Gastroduodenitis, unspecified, without bleeding: Secondary | ICD-10-CM | POA: Diagnosis not present

## 2014-10-18 DIAGNOSIS — K2970 Gastritis, unspecified, without bleeding: Secondary | ICD-10-CM

## 2014-10-18 DIAGNOSIS — K2950 Unspecified chronic gastritis without bleeding: Secondary | ICD-10-CM | POA: Diagnosis not present

## 2014-10-18 DIAGNOSIS — R109 Unspecified abdominal pain: Secondary | ICD-10-CM | POA: Diagnosis present

## 2014-10-18 DIAGNOSIS — R112 Nausea with vomiting, unspecified: Secondary | ICD-10-CM | POA: Diagnosis not present

## 2014-10-18 DIAGNOSIS — K209 Esophagitis, unspecified: Secondary | ICD-10-CM | POA: Diagnosis not present

## 2014-10-18 MED ORDER — SODIUM CHLORIDE 0.9 % IV SOLN: 500.0000 mL | INTRAVENOUS | Status: DC

## 2014-10-18 MED ORDER — ONDANSETRON 4 MG PO TBDP: ORAL_TABLET | ORAL | Status: DC

## 2014-10-18 MED ORDER — FLUCONAZOLE 100 MG PO TABS: 100.0000 mg | ORAL_TABLET | Freq: Every day | ORAL | Status: DC

## 2014-10-18 NOTE — Op Note (Signed)
[place]  [address]. [place], [id number]   ENDOSCOPY PROCEDURE REPORT  PATIENT: Michele, [name]  MR#: [id number] BIRTHDATE: [birth year] , 88  yrs. old GENDER: female ENDOSCOPIST: Lafayette Dragon, MD REFERRED BY:  Burnard Bunting, M.D. PROCEDURE DATE:  10/18/2014 PROCEDURE:  EGD w/ biopsy ASA CLASS:     Class III INDICATIONS:  nausea and 2 week history of nausea without vomiting. Evaluated in ED for dehydration.  CT scan of the abdomen suggests thickening of the gastric antrum. MEDICATIONS: Monitored anesthesia care and Propofol 200 mg IV TOPICAL ANESTHETIC: none  DESCRIPTION OF PROCEDURE: After the risks benefits and alternatives of the procedure were thoroughly explained, informed consent was obtained.  The [id number] endoscope was introduced through the mouth and advanced to the second portion of the duodenum , Without limitations.  The instrument was slowly withdrawn as the mucosa was fully examined.    Esophagus: there was Candida esophagitis throughout the entire esophagus. There were pinpoint of collections of exudate which were biopsied and sent to pathology. There was also a small papilla diarrhea-like polyp in mid esophagus which was removed. Esophagus was slightly dilated and tortuous. 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 There was a mild benign-appearing no nonobstructing esophageal stricture at the GE junction at 30 cm from the incisors  Stomach: there was a 4 cm hiatal hernia which was nonreducible and extended from 40-44 cm from the incisors. There were no Cameron erosions. Gastric mucosa was essentially normal. It appeared slightly atrophic. Abscess were taken from gastric antral to rule out H. pylori. Gastric outlet was normal. There was no retention  Duodenum: of gastric content up and descending duodenum was normal[         The scope was then withdrawn from the patient and the procedure completed.  COMPLICATIONS:  There were no immediate complications.  ENDOSCOPIC IMPRESSION: 1. Candida esophagitis 2. 4 cm nonreducible hiatal hernia without Cameron erosions 3. Minimal nonspecific gastritis. Status post biopsies 4. Nonobstructing distal esophageal stricture 5. No evidence of of gastric antral thickening as suggested by CT scan  RECOMMENDATIONS: 1.  Await pathology results 2.  Anti-reflux regimen to be follow 3.  Continue pantoprazole 4. Zofran 4 mg when necessary nausea 5. Follow-up in the office in 2 weeks for follow-up 6. Diflucan 100 mg by mouth daily - 3days REPEAT EXAM: for EGD pending biopsy results.  eSigned:  Lafayette Dragon, MD 10/18/2014 9:42 AM    CC:  PATIENT NAME:  Michele, [name] MR#: [id number]

## 2014-10-18 NOTE — Patient Instructions (Signed)
YOU HAD AN ENDOSCOPIC PROCEDURE TODAY AT [facility]:   Refer to the procedure report that was given to you for any specific questions about what was found during the examination.  If the procedure report does not answer your questions, please call your gastroenterologist to clarify.  If you requested that your care partner not be given the details of your procedure findings, then the procedure report has been included in a sealed envelope for you to review at your convenience later.  YOU SHOULD EXPECT: Some feelings of bloating in the abdomen. Passage of more gas than usual.  Walking can help get rid of the air that was put into your GI tract during the procedure and reduce the bloating.   Please Note:  You might notice some irritation and congestion in your nose or some drainage.  This is from the oxygen used during your procedure.  There is no need for concern and it should clear up in a day or so.  SYMPTOMS TO REPORT IMMEDIATELY:   Following upper endoscopy (EGD)  Vomiting of blood or coffee ground material  New chest pain or pain under the shoulder blades  Painful or persistently difficult swallowing  New shortness of breath  Fever of 100F or higher  Black, tarry-looking stools  For urgent or emergent issues, a gastroenterologist can be reached at any hour by calling [phone].  DIET: Your first meal following the procedure should be a small meal and then it is ok to progress to your normal diet. Heavy or fried foods are harder to digest and may make you feel nauseous or bloated.  Likewise, meals heavy in dairy and vegetables can increase bloating.  Drink plenty of fluids but you should avoid alcoholic beverages for 24 hours.  ACTIVITY:  You should plan to take it easy for the rest of today and you should NOT DRIVE or use heavy machinery until tomorrow (because of the sedation medicines used during the test).    FOLLOW UP: Our staff will call the number listed on  your records the next business day following your procedure to check on you and address any questions or concerns that you may have regarding the information given to you following your procedure. If we do not reach you, we will leave a message.  However, if you are feeling well and you are not experiencing any problems, there is no need to return our call.  We will assume that you have returned to your regular daily activities without incident.  If any biopsies were taken you will be contacted by phone or by letter within the next 1-3 weeks.  Please call us at [phone] if you have not heard about the biopsies in 3 weeks.    SIGNATURES/CONFIDENTIALITY: You and/or your care partner have signed paperwork which will be entered into your electronic medical record.  These signatures attest to the fact that that the information above on your After Visit Summary has been reviewed and is understood.  Full responsibility of the confidentiality of this discharge information lies with you and/or your care-partner.  Await pathology  Take Diflucan 1 tablet daily for 3 days  Take Zofran 1 tablet before breakfast daily, may take 1 more tablet 6 to 8 hours daily if nausea continues  Dr. Nichola Sizer office nurse will call you to set up appointment in office for 2 weeks  Continue Pantoprazole  Please read over handouts about anti-reflux

## 2014-10-18 NOTE — Progress Notes (Signed)
Pt to PACU. Pt awake and alert, pleased with MAC. Report to RN

## 2014-10-18 NOTE — Progress Notes (Signed)
Called to room to assist during endoscopic procedure.  Patient ID and intended procedure confirmed with present staff. Received instructions for my participation in the procedure from the performing physician.  

## 2014-10-18 NOTE — Telephone Encounter (Signed)
Laverna Peace, RN  Hulan Saas, RN           Rollene Fare,   If you cannot reach this pt to set up a return office visit, her daughter asks that you call her:  Katelin Kutsch [phone]   Cyril Mourning    Patient's daughter notified.

## 2014-10-18 NOTE — Progress Notes (Signed)
Late entry- extensive time spent with pt and daughter answering questions

## 2014-10-19 ENCOUNTER — Telehealth: Payer: Self-pay | Admitting: *Deleted

## 2014-10-19 NOTE — Telephone Encounter (Signed)
  Follow up Call-  Call back number 10/18/2014  Post procedure Call Back phone  # [phone]  Permission to leave phone message Yes     No answer at # given.  Left message on VM.

## 2014-10-19 NOTE — Telephone Encounter (Signed)
Lafayette Dragon, MD  Hulan Saas, RN           OK thanx DB       Previous Messages     ----- Message -----   From: Hulan Saas, RN   Sent: 10/18/2014  1:35 PM    To: Lafayette Dragon, MD   Dr. Olevia Perches,  I did not have any place to put this patient to see you in 2 weeks. I put her on Tye Savoy, NP schedule for f/u on 10/27/14 1:30 PM.  Rollene Fare      Left a message for Bradley Ferris that this appointment is ok with Dr. Olevia Perches.

## 2014-10-20 ENCOUNTER — Ambulatory Visit: Payer: Medicare Other | Admitting: Physician Assistant

## 2014-10-25 ENCOUNTER — Encounter: Payer: Self-pay | Admitting: Internal Medicine

## 2014-10-31 ENCOUNTER — Ambulatory Visit (HOSPITAL_COMMUNITY): Admission: RE | Admit: 2014-10-31 | Payer: Medicare Other | Source: Ambulatory Visit

## 2014-11-01 ENCOUNTER — Ambulatory Visit (INDEPENDENT_AMBULATORY_CARE_PROVIDER_SITE_OTHER): Payer: Medicare Other | Admitting: Physician Assistant

## 2014-11-01 ENCOUNTER — Encounter: Payer: Self-pay | Admitting: Physician Assistant

## 2014-11-01 VITALS — BP 132/78 | HR 82 | Ht 62.0 in | Wt 112.0 lb

## 2014-11-01 DIAGNOSIS — R112 Nausea with vomiting, unspecified: Secondary | ICD-10-CM | POA: Diagnosis not present

## 2014-11-01 MED ORDER — ONDANSETRON 4 MG PO TBDP: ORAL_TABLET | ORAL | Status: DC

## 2014-11-01 NOTE — Progress Notes (Signed)
Patient ID: Michele [name], female   DOB: 01-24-1927, 81 y.o.   MRN: [id number]     History of Present Illness: Michele [name] is a delightful 81 year old female known to Dr. Maurene Capes. She was seen on 10/17/2014 with complaints of nausea and anorexia with no vomiting. She reported a 10 pound weight loss. Abdominal ultrasound in 2013 showed common bile duct of 4 mm. Normal size spleen at 8 cm. Repeat ultrasound recently was also normal. CT scan of the abdomen in May 2016 showed a hiatal hernia, gastric antral thickening and a moderate sized hiatal hernia. She underwent an EGD by Dr. Maurene Capes on 10/18/2014 and was noted to have Candida esophagitis, a 4 cm nonreducible hiatal hernia without Cameron erosions, minimal nonspecific gastritis, and a nonobstructing distal esophageal stricture. There was no evidence of gastric antral thickening as suggested by CT. She was treated with Diflucan 100 mg by mouth daily for 3 days. She was instructed to continue pantoprazole and to use Zofran as needed for nausea and is here for follow-up. Biopsy showed benign gastric mucosa with mild chronic gastritis. Today she states she is feeling better she is eating well and is up to 112 pounds from 102 pounds at her last visit. He has not felt nauseous but states she takes Zofran twice daily to prevent becoming nauseous. She is moving her bowels regularly.   Past Medical History  Diagnosis Date  . Osteoporosis   . Cystocele 08/2011  . Melanoma 11/2001  . Hypertension   . Hematuria     GU workup  . UTI (urinary tract infection)     septic  . Asthma   . Hiatal hernia   . Diverticulosis   . Osteoarthritis   . GERD (gastroesophageal reflux disease)   . Scoliosis   . COPD (chronic obstructive pulmonary disease)     Past Surgical History  Procedure Laterality Date  . Hysteroscopy  06/1998    D&C-Benign  . Melanoma excision     Family History  Problem Relation Age of Onset  . Hypertension Father   . Heart attack  Father   . Colon cancer Neg Hx    History  Substance Use Topics  . Smoking status: Never Smoker   . Smokeless tobacco: Never Used  . Alcohol Use: No     Comment: cocktail    Current Outpatient Prescriptions  Medication Sig Dispense Refill  . Multiple Vitamin (MULTIVITAMIN WITH MINERALS) TABS Take 1 tablet by mouth daily.    . ondansetron (ZOFRAN-ODT) 4 MG disintegrating tablet Take 1 tablet every morning before breakfast.  If nausea continues, may take one more pill 6 to 8 hours later 30 tablet 6  . pantoprazole (PROTONIX) 40 MG tablet Take 1 tablet (40 mg total) by mouth daily at 6 (six) AM. 30 tablet 0  . zolpidem (AMBIEN) 10 MG tablet Take 10 mg by mouth at bedtime as needed. For sleep     No current facility-administered medications for this visit.   Allergies  Allergen Reactions  . Penicillins Other (See Comments)    Reaction unknown  . Pneumococcal Vaccines Other (See Comments)    Reaction unknown      Review of Systems: Per history of present illness otherwise negative   Physical Exam: General: Pleasant, well developed female in no acute distress Head: Normocephalic and atraumatic Eyes:  sclerae anicteric, conjunctiva pink  Ears: Normal auditory acuity Lungs: Clear throughout to auscultation Heart: Regular rate and rhythm Abdomen: Soft, non distended, non-tender. No masses, no  hepatomegaly. Normal bowel sounds Musculoskeletal: Symmetrical with no gross deformities  Extremities: No edema  Neurological: Alert oriented x 4, grossly nonfocal Psychological:  Alert and cooperative. Normal mood and affect  Assessment and Recommendations: 81 year old female recently seen for nausea without vomiting status post recent EGD that revealed a yeast esophagitis here for follow-up. She has completed her course of Diflucan and is feeling much better she is using Zofran twice a day on a preventative basis she was instructed to use her Zofran as needed. She will continue  pantoprazole on a daily basis and will follow up as needed.        Malee Grays, Vita Barley PA-C 11/01/2014,

## 2014-11-01 NOTE — Patient Instructions (Signed)
We have sent  medications to your pharmacy for you to pick up at your convenience. Follow up as needed.

## 2014-11-01 NOTE — Progress Notes (Signed)
Reviewed, I called pt at home because I promised to see her when she comes up  And she is feeling better, she liked Cecille Rubin.

## 2014-11-02 ENCOUNTER — Encounter: Payer: Self-pay | Admitting: Internal Medicine

## 2014-11-20 ENCOUNTER — Telehealth: Payer: Self-pay | Admitting: Obstetrics & Gynecology

## 2014-11-20 NOTE — Telephone Encounter (Signed)
Routing to Dr.Miller for review and advise. 

## 2014-11-20 NOTE — Telephone Encounter (Signed)
Patient received a aex recall letter and is asking if it is necessary for her to have an aex done. Patient says she is seen every three months for her pessary. Last seen 08/08/14.

## 2014-11-21 NOTE — Telephone Encounter (Signed)
OK to just come for pessary checks, cleaning and evaluation for this problem.  Thanks.

## 2014-11-21 NOTE — Telephone Encounter (Signed)
Left message to call Kaitlyn at [phone]. 

## 2014-11-23 NOTE — Telephone Encounter (Signed)
Spoke with patient. Advised of message as seen below from [place]. Patient is agreeable and verbalizes understanding. Has appointment scheduled for 12/28/2014 at 12:45pm with Regina Eck CNM .  Routing to provider for final review. Patient agreeable to disposition. Will close encounter.

## 2014-12-27 ENCOUNTER — Ambulatory Visit: Payer: Medicare Other | Admitting: Certified Nurse Midwife

## 2014-12-27 DIAGNOSIS — Z0000 Encounter for general adult medical examination without abnormal findings: Secondary | ICD-10-CM | POA: Insufficient documentation

## 2014-12-28 ENCOUNTER — Ambulatory Visit (INDEPENDENT_AMBULATORY_CARE_PROVIDER_SITE_OTHER): Payer: Medicare Other | Admitting: Certified Nurse Midwife

## 2014-12-28 ENCOUNTER — Encounter: Payer: Self-pay | Admitting: Certified Nurse Midwife

## 2014-12-28 VITALS — BP 138/80 | HR 68 | Ht 62.0 in | Wt 112.0 lb

## 2014-12-28 DIAGNOSIS — Z1239 Encounter for other screening for malignant neoplasm of breast: Secondary | ICD-10-CM | POA: Diagnosis not present

## 2014-12-28 DIAGNOSIS — Z4689 Encounter for fitting and adjustment of other specified devices: Secondary | ICD-10-CM

## 2014-12-28 NOTE — Progress Notes (Signed)
81 y.o. Widowed White female G3P3 here for pessary check.  Patient has been using following pessary style and size:  # 4 ring pessary with support..  She is not sexually active.  She describes the following issues with the pessary:  None. Patient would also like to have breast exam, was not done with PCP. Denies vaginal bleeding, problems voiding or urinary incontinence. Denies difficulty emptying her bowels or constipation or increase in vaginal discharge. Happy with pessary use. Getting her affairs in orders to move to [place] in 1/17. Granddaughter recently married! Ambulating well, no other health issues today.  ROS Exam:   BP 138/80 mmHg  Pulse 68  Ht 5\' 2"  (1.575 m)  Wt 112 lb (50.803 kg)  BMI 20.48 kg/m2  LMP 05/26/1997 General appearance: alert, cooperative and appears stated age Cervical, supraclavicular, and axillary nodes normal. and inguinal lymph nodes normal,non tender  Breasts: normal appearance, no masses or nipple discharge noted or skin changes  Pelvic: External genitalia:  no lesions and atrophic appearance              Urethra: normal appearing urethra with no masses, tenderness or lesions and bladder and urethral meatus non tender              Bartholins and Skenes: normal, Bartholin's, Urethra, Skene's normal                 Vagina: Pessary noted in place, Lidocaine jelly small amount applied and 5 minutes later pessary removed without difficulty. Atrophic appearance with scant moisture noted. No vaginal lacerations or ulcerations noted  Cystocele grade 2              Cervix: normal appearance and no ulcerations or excoriations noted, Bimanual Exam:  Uterus:  uterus is normal size, shape, consistency and nontender                               Adnexa:    Adnexa not palpated, no masses or fullness noted                               Anus:  normal sphincter tone, no lesions, no issues with pessary position  Pessary was removed without difficulty.  Pessary was cleansed.   Pessary was replaced. Patient tolerated procedure well.    A:Normal pelvic exam Cystocele- symptomatic with pessary use for support Continue coconut oil use for moisture Normal Breast exam      P:Reviewed normal findings and reviewed warning signs with pessary use and need to advise.   Return to office in 3 months for recheck.   Encouraged do SBE monthly and clinical exam yearly.    Rv 3 months, prn  An After Visit Summary was printed and given to the patient.

## 2014-12-29 NOTE — Progress Notes (Signed)
Reviewed personally.  M. Suzanne Aleen Marston, MD.  

## 2015-01-03 DIAGNOSIS — I129 Hypertensive chronic kidney disease with stage 1 through stage 4 chronic kidney disease, or unspecified chronic kidney disease: Secondary | ICD-10-CM | POA: Insufficient documentation

## 2015-04-11 ENCOUNTER — Ambulatory Visit: Payer: Medicare Other | Admitting: Certified Nurse Midwife

## 2015-04-23 ENCOUNTER — Telehealth: Payer: Self-pay | Admitting: Certified Nurse Midwife

## 2015-04-23 NOTE — Telephone Encounter (Signed)
Left message regarding upcoming appointment has been canceled and needs to be rescheduled. °

## 2015-04-26 ENCOUNTER — Ambulatory Visit: Payer: Medicare Other | Admitting: Certified Nurse Midwife

## 2015-05-03 ENCOUNTER — Ambulatory Visit (INDEPENDENT_AMBULATORY_CARE_PROVIDER_SITE_OTHER): Payer: Medicare Other | Admitting: Certified Nurse Midwife

## 2015-05-03 ENCOUNTER — Encounter: Payer: Self-pay | Admitting: Certified Nurse Midwife

## 2015-05-03 VITALS — BP 108/68 | HR 68 | Resp 16 | Ht 62.0 in | Wt 113.0 lb

## 2015-05-03 DIAGNOSIS — Z1239 Encounter for other screening for malignant neoplasm of breast: Secondary | ICD-10-CM

## 2015-05-03 DIAGNOSIS — Z4689 Encounter for fitting and adjustment of other specified devices: Secondary | ICD-10-CM | POA: Diagnosis not present

## 2015-05-03 NOTE — Progress Notes (Signed)
81 y.o. WidowedWhite female  G3P3 here for pessary surveillance  Patient has been using pessary style and size # 4 ring pessary for support. She reports these associated symptoms: slight pressure occasionally, but denies vaginal bleeding or vaginal discharge or difficulty emptying her bowels. Occasional bowel incontinence with wiping only. "No problems". Patient still driving short distances. Really likes the pessary and is having no issues with use. Uses coconut oil occasionally with pessary, but no dryness. Will be moving to [place] in 1/17.Patient is not sexually active. Patient requests breast exam due to PCP does not perform. She also is due for mammogram and wants to call and schedule after she moves.  Exam: BP 108/68 mmHg  Pulse 68  Resp 16  Ht 5\' 2"  (1.575 m)  Wt 113 lb (51.256 kg)  BMI 20.66 kg/m2  LMP 05/26/1997 General appearance: alert, cooperative, appears stated age and no distress  Breast exam: Normal appearing breast with no masses, nipple discharge or skin change. No axillary lymph node enlargement or tenderness  Pelvic: External genitalia: normal female              Urethra: normal appearing urethra with no masses, tenderness or lesions              Bartholins and Skenes: Bartholin's, Urethra, Skene's normal                 Vagina:atrophic  appearing vagina with normal color and discharge, no lesions,ulcerations or excoriations. Pessary noted in place.              Cervix: normal appearance and non tender, no excoriations or ulcerations noted Bimanual Exam:  Uterus:  uterus is normal size, shape, consistency and nontender                               Adnexa:    normal adnexa in size, nontender and no masses                               Anus:  normal sphincter tone, no lesions  Procedure:  Small amount of Lidocaine gel applied intravaginally and pessary removed without discomfort or problems. Pessary cleaned and replaced. Patient tolerated well. Patient ambulated, sat  and voided without problems. "Pessary feels normal"  Assessment:  Normal pelvic exam Cystocele symptomatic with Pessary support, no contraindications to use Atrophic vaginitis Normal breast exam  Plan:Reviwed findings of normal pelvic and vaginal exam. reviewed warning signs with pessary and need to advise. Encouraged to use coconut oil routinely for moisture and prevent dryness with pessry use. Reviewed normal breast exam and given number to schedule mammogram and [facility]( per her request) declines our help with scheduling.  Rv 3 months, prn    Aftercare summary was provided.

## 2015-05-03 NOTE — Patient Instructions (Signed)
Breast Self-Awareness Practicing breast self-awareness may pick up problems early, prevent significant medical complications, and possibly save your life. By practicing breast self-awareness, you can become familiar with how your breasts look and feel and if your breasts are changing. This allows you to notice changes early. It can also offer you some reassurance that your breast health is good. One way to learn what is normal for your breasts and whether your breasts are changing is to do a breast self-exam. If you find a lump or something that was not present in the past, it is best to contact your caregiver right away. Other findings that should be evaluated by your caregiver include nipple discharge, especially if it is bloody; skin changes or reddening; areas where the skin seems to be pulled in (retracted); or new lumps and bumps. Breast pain is seldom associated with cancer (malignancy), but should also be evaluated by a caregiver. HOW TO PERFORM A BREAST SELF-EXAM The best time to examine your breasts is 5-7 days after your menstrual period is over. During menstruation, the breasts are lumpier, and it may be more difficult to pick up changes. If you do not menstruate, have reached menopause, or had your uterus removed (hysterectomy), you should examine your breasts at regular intervals, such as monthly. If you are breastfeeding, examine your breasts after a feeding or after using a breast pump. Breast implants do not decrease the risk for lumps or tumors, so continue to perform breast self-exams as recommended. Talk to your caregiver about how to determine the difference between the implant and breast tissue. Also, talk about the amount of pressure you should use during the exam. Over time, you will become more familiar with the variations of your breasts and more comfortable with the exam. A breast self-exam requires you to remove all your clothes above the waist. 1. Look at your breasts and nipples.  Stand in front of a mirror in a room with good lighting. With your hands on your hips, push your hands firmly downward. Look for a difference in shape, contour, and size from one breast to the other (asymmetry). Asymmetry includes puckers, dips, or bumps. Also, look for skin changes, such as reddened or scaly areas on the breasts. Look for nipple changes, such as discharge, dimpling, repositioning, or redness. 2. Carefully feel your breasts. This is best done either in the shower or tub while using soapy water or when flat on your back. Place the arm (on the side of the breast you are examining) above your head. Use the pads (not the fingertips) of your three middle fingers on your opposite hand to feel your breasts. Start in the underarm area and use  inch (2 cm) overlapping circles to feel your breast. Use 3 different levels of pressure (light, medium, and firm pressure) at each circle before moving to the next circle. The light pressure is needed to feel the tissue closest to the skin. The medium pressure will help to feel breast tissue a little deeper, while the firm pressure is needed to feel the tissue close to the ribs. Continue the overlapping circles, moving downward over the breast until you feel your ribs below your breast. Then, move one finger-width towards the center of the body. Continue to use the  inch (2 cm) overlapping circles to feel your breast as you move slowly up toward the collar bone (clavicle) near the base of the neck. Continue the up and down exam using all 3 pressures until you reach the   middle of the chest. Do this with each breast, carefully feeling for lumps or changes. 3.  Keep a written record with breast changes or normal findings for each breast. By writing this information down, you do not need to depend only on memory for size, tenderness, or location. Write down where you are in your menstrual cycle, if you are still menstruating. Breast tissue can have some lumps or  thick tissue. However, see your caregiver if you find anything that concerns you.  SEEK MEDICAL CARE IF:  You see a change in shape, contour, or size of your breasts or nipples.   You see skin changes, such as reddened or scaly areas on the breasts or nipples.   You have an unusual discharge from your nipples.   You feel a new lump or unusually thick areas.    This information is not intended to replace advice given to you by your health care provider. Make sure you discuss any questions you have with your health care provider.   Document Released: 05/12/2005 Document Revised: 04/28/2012 Document Reviewed: 08/27/2011 Elsevier Interactive Patient Education 2016 [employer]  

## 2015-05-14 NOTE — Progress Notes (Signed)
Reviewed personally.  M. Suzanne Toriano Aikey, MD.  

## 2015-05-27 DIAGNOSIS — S2239XA Fracture of one rib, unspecified side, initial encounter for closed fracture: Secondary | ICD-10-CM

## 2015-05-27 HISTORY — DX: Fracture of one rib, unspecified side, initial encounter for closed fracture: S22.39XA

## 2015-09-06 ENCOUNTER — Ambulatory Visit (INDEPENDENT_AMBULATORY_CARE_PROVIDER_SITE_OTHER): Payer: Medicare Other | Admitting: Certified Nurse Midwife

## 2015-09-06 ENCOUNTER — Encounter: Payer: Self-pay | Admitting: Certified Nurse Midwife

## 2015-09-06 VITALS — BP 112/68 | HR 70 | Resp 16 | Ht 62.0 in | Wt 112.0 lb

## 2015-09-06 DIAGNOSIS — IMO0002 Reserved for concepts with insufficient information to code with codable children: Secondary | ICD-10-CM

## 2015-09-06 DIAGNOSIS — N8110 Cystocele, unspecified: Secondary | ICD-10-CM | POA: Diagnosis not present

## 2015-09-06 DIAGNOSIS — Z4689 Encounter for fitting and adjustment of other specified devices: Secondary | ICD-10-CM | POA: Diagnosis not present

## 2015-09-06 NOTE — Progress Notes (Signed)
Subjective:   82 y.o. Widowed White female G3P3 here for pessary check.  Patient has been using following pessary style and size: # 4 ring pessary for support.  She describes the following issues with the pessary:  None. Patient now living at [facility] and happy with choice. Staying active with exercise daily. Feels pessary is working well .  Sees PCP yearly, no health changes, still driving.   Use of protective clothing such as Depends or pads No..   Constipation issues with use of pessary No..  She is not sexually active.     ROS:    no abnormal bleeding, pelvic pain or discharge,   no dysuria, trouble voiding or hematuria. Compliant to use of vaginal cream No.   General Exam:    LMP 05/26/1997  General appearance: alert, cooperative and appears stated age   Pelvic: External genitalia:  no lesions and atrophic appearance   Small amount of Lidocaine jelly applied for comfort during removal.   Before pessary was removed No. prolapse over the pessary   In correct position Yes.                Urethra: normal appearing urethra with no masses, tenderness or lesions              Vagina:atrophic appearing vagina with scant moisture noted no lesions, or bleeding noted..  There are No abrasions or ulcerations.               Cervix: normal appearance Cervical lesions were not found   Bimanual Exam:  Uterus:  normal size, contour, position, consistency, mobility, non-tender"                               Adnexa:    normal adnexa in size, nontender and no masses                             Pessary was removed without difficulty without using forceps.  Pessary was cleansed with Betadine.  Pessary was replaced. Patient tolerated procedure well. Patient ambulated, sat, and voided without problems after insertion. "Feels right".    Assessment :  Cystocele- asymptomatic       Normal pelvic exam    Normal pessary surveillance   Use of pessary continued recommended and desired by patient.   Plan:     Reviewed finding of normal pelvic exam and vaginal and cervical appearance. Pessary still working well. Warning signs of pessary given and need to advise if problems. Discussed coconut oil use for moisture and importance to prevent dryness issues.Questions addressed.  Recheck 3  Months  An After Visit Summary was printed and given to the patient.

## 2015-09-11 NOTE — Progress Notes (Signed)
Encounter reviewed Dawan Farney, MD   

## 2015-10-04 DIAGNOSIS — K2940 Chronic atrophic gastritis without bleeding: Secondary | ICD-10-CM | POA: Insufficient documentation

## 2015-11-06 ENCOUNTER — Other Ambulatory Visit (HOSPITAL_COMMUNITY): Payer: Self-pay | Admitting: Internal Medicine

## 2015-11-06 DIAGNOSIS — K2950 Unspecified chronic gastritis without bleeding: Secondary | ICD-10-CM

## 2015-11-06 DIAGNOSIS — R110 Nausea: Secondary | ICD-10-CM

## 2015-11-09 ENCOUNTER — Ambulatory Visit (HOSPITAL_COMMUNITY): Payer: Medicare Other

## 2015-11-20 ENCOUNTER — Encounter (HOSPITAL_COMMUNITY)
Admission: RE | Admit: 2015-11-20 | Discharge: 2015-11-20 | Disposition: A | Discharge status: Home / Self Care | Payer: Medicare Other | Source: Ambulatory Visit | Attending: Internal Medicine | Admitting: Internal Medicine

## 2015-11-20 DIAGNOSIS — R110 Nausea: Secondary | ICD-10-CM | POA: Diagnosis present

## 2015-11-20 DIAGNOSIS — K2950 Unspecified chronic gastritis without bleeding: Secondary | ICD-10-CM | POA: Insufficient documentation

## 2015-11-20 MED ORDER — TECHNETIUM TC 99M MEBROFENIN IV KIT
5.4100 | PACK | Freq: Once | INTRAVENOUS | Status: AC | PRN
  Administered 2015-11-20: 5.41 via INTRAVENOUS

## 2015-12-20 ENCOUNTER — Encounter: Payer: Self-pay | Admitting: Certified Nurse Midwife

## 2015-12-20 ENCOUNTER — Ambulatory Visit (INDEPENDENT_AMBULATORY_CARE_PROVIDER_SITE_OTHER): Payer: Medicare Other | Admitting: Certified Nurse Midwife

## 2015-12-20 VITALS — BP 104/64 | HR 68 | Resp 16 | Ht 62.0 in | Wt 111.0 lb

## 2015-12-20 DIAGNOSIS — Z1239 Encounter for other screening for malignant neoplasm of breast: Secondary | ICD-10-CM

## 2015-12-20 DIAGNOSIS — Z4689 Encounter for fitting and adjustment of other specified devices: Secondary | ICD-10-CM

## 2015-12-20 NOTE — Progress Notes (Signed)
Encounter reviewed Travone Georg, MD   

## 2015-12-20 NOTE — Progress Notes (Signed)
Patient had screening mammogram at [facility] 06/2015. Fax copy received and to [employer] CNM inbox.

## 2015-12-20 NOTE — Progress Notes (Signed)
Subjective:   82 y.o. WidowedWhite female G3P3 here for pessary check.  Patient has been using following pessary style and size:  4 ring pessary for support..  She describes the following issues with the pessary:  None. It has been 4 months since rechecked. She has not been using vaginal moisture with pessary. PCP did not do breast exam and requests today. Also needs help in scheduling mammogram..   Use of protective clothing such as Depends or pads No.. Problems with protective clothing with rash No..  Constipation issues with use of pessary No..  She is not sexually active.     ROS:   no breast pain or new or enlarging lumps on self exam, no vaginal bleeding, no discharge or pelvic pain,  no abnormal bleeding, pelvic pain or discharge, no dysuria, trouble voiding or hematuria   no dysuria, trouble voiding or hematuria. Compliant to use of vaginal cream No.   General Exam:    BP 104/64   Pulse 68   Resp 16   Ht 5\' 2"  (1.575 m)   Wt 111 lb (50.3 kg)   LMP 05/26/1997   BMI 20.30 kg/m   General appearance: alert, cooperative, appears stated age and no distress  Breast: normal , non tender, no masses or nipple discharge, no skin change bilateral.  Pelvic: External genitalia:  no lesions and atrophic appearance   Before pessary was removed No. prolapse over the pessary   In correct position Yes.                Urethra: not indicated and normal appearing urethra with no masses, tenderness or lesions              Vagina: normal appearing vagina with normal color and discharge, no lesions, atrophic, no excoriations or ulcerations noted.  There are No abrasions or ulcerations.               Cervix: normal appearance and no ulcerations noted Cervical lesions were not found   Bimanual Exam:  Uterus:  normal size, contour, position, consistency, mobility, non-tender"uterus is normal size, shape, consistency and nontender"}                               Adnexa:    No masses noted or fullness, non  tender                             Pessary was removed without difficulty without using forceps.  Pessary was cleansed with Betadine.  Pessary was replaced. Patient tolerated procedure well.    Assessment :  Normal exam, Cystocele- symptomatic        With pessary support   Use of pessary continued  Normal breast exam   Plan:    Return to office in 3 months for recheck.         prn    An After Visit Summary was printed and given to the patient.

## 2015-12-20 NOTE — Patient Instructions (Signed)
Come in for pessary check in 3 months. If you have bleeding or vaginal discharge or pain you need to come earlier. Keep mammogram appointment. Use vaginal moisturizer at least twice weekly.  Debbi

## 2016-03-19 ENCOUNTER — Ambulatory Visit (INDEPENDENT_AMBULATORY_CARE_PROVIDER_SITE_OTHER): Payer: Medicare Other | Admitting: Certified Nurse Midwife

## 2016-03-19 ENCOUNTER — Encounter: Payer: Self-pay | Admitting: Certified Nurse Midwife

## 2016-03-19 VITALS — BP 104/70 | HR 68 | Resp 16 | Ht 62.0 in | Wt 114.0 lb

## 2016-03-19 DIAGNOSIS — Z4689 Encounter for fitting and adjustment of other specified devices: Secondary | ICD-10-CM

## 2016-03-19 NOTE — Patient Instructions (Signed)
Be sure to use coconut oil at least weekly in vagina for dryness and can use external as needed for dryness

## 2016-03-19 NOTE — Progress Notes (Signed)
Subjective:   82 y.o. Widowed caucasian female G3P3 here for pessary check.  Patient has been using following pessary style and size:  Size 4 ring pessary with support.  She describes the following issues with the pessary:  none.   Use of protective clothing such as Depends or pads No.. Problems with protective clothing with rash No..  Constipation issues with use of pessary No..  She is not sexually active.     ROS:   no vaginal bleeding, no discharge or pelvic pain,  no abnormal bleeding, pelvic pain or discharge, no dysuria, trouble voiding or hematuria   no dysuria, trouble voiding or hematuria. Compliant to use of vaginal cream No.   General Exam:    BP 104/70   Pulse 68   Resp 16   Ht 5\' 2"  (1.575 m)   Wt 114 lb (51.7 kg)   LMP 05/26/1997   BMI 20.85 kg/m   General appearance: alert, cooperative, appears stated age and no distress   Pelvic: External genitalia:  no lesions and atrophic appearance   Before pessary was removed No. prolapse over the pessary   In correct position Yes.                Urethra: not indicated and normal appearing urethra with no masses, tenderness or lesions              Vagina: normal appearing vagina with normal color and discharge, no lesions, atrophic.  There are No abrasions or ulcerations. With cystocele noted grade 2 noted               Cervix: normal appearance Cervical lesions were not found   Bimanual Exam:  Uterus:  normal size, contour, position, consistency, mobility, non-tender"                               Adnexa:    normal adnexa in size, nontender and no masses                            Small amount of lidocaine rubbed around vaginal introitus for removal with out discomfort. Pessary was removed without difficulty without using forceps.  Pessary was cleansed with Betadine.  Pessary was replaced. Patient tolerated procedure well.   Patient ambulated and sat and voided without problems.  Assessment :  Cystocele- symptomatic with good  response with pessary use   Use of pessary continued   Plan:    Return to office in 3 months for recheck.         warning signs of pessary use reviewed. Reminded to use coconut oil at least weekly if not daily for dryness and to call if vaginal bleeding.    An After Visit Summary was printed and given to the patient.

## 2016-03-21 NOTE — Progress Notes (Signed)
Encounter reviewed Jamar Casagrande, MD   

## 2016-04-08 ENCOUNTER — Inpatient Hospital Stay (HOSPITAL_COMMUNITY)
Admission: EM | Admit: 2016-04-08 | Discharge: 2016-04-09 | DRG: 183 | Disposition: A | Discharge status: Skilled Nursing Facility | Payer: Medicare Other | Attending: Internal Medicine | Admitting: Internal Medicine

## 2016-04-08 ENCOUNTER — Emergency Department (HOSPITAL_COMMUNITY): Payer: Medicare Other

## 2016-04-08 ENCOUNTER — Encounter (HOSPITAL_COMMUNITY): Payer: Self-pay | Admitting: Emergency Medicine

## 2016-04-08 DIAGNOSIS — Z8249 Family history of ischemic heart disease and other diseases of the circulatory system: Secondary | ICD-10-CM

## 2016-04-08 DIAGNOSIS — Z85820 Personal history of malignant melanoma of skin: Secondary | ICD-10-CM | POA: Diagnosis not present

## 2016-04-08 DIAGNOSIS — H9110 Presbycusis, unspecified ear: Secondary | ICD-10-CM | POA: Diagnosis present

## 2016-04-08 DIAGNOSIS — Y92091 Bathroom in other non-institutional residence as the place of occurrence of the external cause: Secondary | ICD-10-CM

## 2016-04-08 DIAGNOSIS — K449 Diaphragmatic hernia without obstruction or gangrene: Secondary | ICD-10-CM | POA: Diagnosis present

## 2016-04-08 DIAGNOSIS — S2249XA Multiple fractures of ribs, unspecified side, initial encounter for closed fracture: Secondary | ICD-10-CM | POA: Diagnosis present

## 2016-04-08 DIAGNOSIS — S2241XA Multiple fractures of ribs, right side, initial encounter for closed fracture: Secondary | ICD-10-CM | POA: Diagnosis not present

## 2016-04-08 DIAGNOSIS — M810 Age-related osteoporosis without current pathological fracture: Secondary | ICD-10-CM | POA: Diagnosis present

## 2016-04-08 DIAGNOSIS — E871 Hypo-osmolality and hyponatremia: Secondary | ICD-10-CM | POA: Diagnosis not present

## 2016-04-08 DIAGNOSIS — Y939 Activity, unspecified: Secondary | ICD-10-CM | POA: Diagnosis not present

## 2016-04-08 DIAGNOSIS — I10 Essential (primary) hypertension: Secondary | ICD-10-CM | POA: Diagnosis not present

## 2016-04-08 DIAGNOSIS — Z66 Do not resuscitate: Secondary | ICD-10-CM | POA: Diagnosis not present

## 2016-04-08 DIAGNOSIS — J449 Chronic obstructive pulmonary disease, unspecified: Secondary | ICD-10-CM | POA: Diagnosis not present

## 2016-04-08 DIAGNOSIS — S271XXA Traumatic hemothorax, initial encounter: Secondary | ICD-10-CM | POA: Diagnosis not present

## 2016-04-08 DIAGNOSIS — W01198A Fall on same level from slipping, tripping and stumbling with subsequent striking against other object, initial encounter: Secondary | ICD-10-CM | POA: Diagnosis not present

## 2016-04-08 DIAGNOSIS — M419 Scoliosis, unspecified: Secondary | ICD-10-CM | POA: Diagnosis present

## 2016-04-08 DIAGNOSIS — G4700 Insomnia, unspecified: Secondary | ICD-10-CM | POA: Diagnosis present

## 2016-04-08 DIAGNOSIS — D72829 Elevated white blood cell count, unspecified: Secondary | ICD-10-CM | POA: Diagnosis not present

## 2016-04-08 DIAGNOSIS — K219 Gastro-esophageal reflux disease without esophagitis: Secondary | ICD-10-CM | POA: Diagnosis not present

## 2016-04-08 DIAGNOSIS — J942 Hemothorax: Secondary | ICD-10-CM | POA: Diagnosis present

## 2016-04-08 DIAGNOSIS — Z87440 Personal history of urinary (tract) infections: Secondary | ICD-10-CM

## 2016-04-08 LAB — CBC WITH DIFFERENTIAL/PLATELET
BASOS ABS: 0 10*3/uL (ref 0.0–0.1)
Basophils Relative: 0 %
Eosinophils Absolute: 0 10*3/uL (ref 0.0–0.7)
Eosinophils Relative: 0 %
HCT: 37.2 % (ref 36.0–46.0)
Hemoglobin: 13.1 g/dL (ref 12.0–15.0)
LYMPHS ABS: 0.8 10*3/uL (ref 0.7–4.0)
LYMPHS PCT: 5 %
MCH: 32.6 pg (ref 26.0–34.0)
MCHC: 35.2 g/dL (ref 30.0–36.0)
MCV: 92.5 fL (ref 78.0–100.0)
MONO ABS: 1 10*3/uL (ref 0.1–1.0)
MONOS PCT: 6 %
NEUTROS ABS: 14.8 10*3/uL — AB (ref 1.7–7.7)
Neutrophils Relative %: 89 %
Platelets: 297 10*3/uL (ref 150–400)
RBC: 4.02 MIL/uL (ref 3.87–5.11)
RDW: 12.2 % (ref 11.5–15.5)
WBC: 16.6 10*3/uL — ABNORMAL HIGH (ref 4.0–10.5)

## 2016-04-08 LAB — URINALYSIS, ROUTINE W REFLEX MICROSCOPIC
BILIRUBIN URINE: NEGATIVE
Glucose, UA: NEGATIVE mg/dL
KETONES UR: NEGATIVE mg/dL
Leukocytes, UA: NEGATIVE
Nitrite: NEGATIVE
PROTEIN: NEGATIVE mg/dL
Specific Gravity, Urine: 1.01 (ref 1.005–1.030)
pH: 7.5 (ref 5.0–8.0)

## 2016-04-08 LAB — COMPREHENSIVE METABOLIC PANEL
ALT: 18 U/L (ref 14–54)
AST: 47 U/L — AB (ref 15–41)
Albumin: 4.4 g/dL (ref 3.5–5.0)
Alkaline Phosphatase: 49 U/L (ref 38–126)
Anion gap: 10 (ref 5–15)
BILIRUBIN TOTAL: 1.1 mg/dL (ref 0.3–1.2)
BUN: 17 mg/dL (ref 6–20)
CO2: 26 mmol/L (ref 22–32)
CREATININE: 0.75 mg/dL (ref 0.44–1.00)
Calcium: 9.1 mg/dL (ref 8.9–10.3)
Chloride: 95 mmol/L — ABNORMAL LOW (ref 101–111)
GFR calc Af Amer: >60 mL/min (ref >60)
Glucose, Bld: 121 mg/dL — ABNORMAL HIGH (ref 65–99)
POTASSIUM: 4.4 mmol/L (ref 3.5–5.1)
Sodium: 131 mmol/L — ABNORMAL LOW (ref 135–145)
TOTAL PROTEIN: 6.9 g/dL (ref 6.5–8.1)

## 2016-04-08 LAB — URINE MICROSCOPIC-ADD ON

## 2016-04-08 LAB — MRSA PCR SCREENING: MRSA BY PCR: NEGATIVE

## 2016-04-08 MED ORDER — SODIUM CHLORIDE 0.9 % IV SOLN
INTRAVENOUS | Status: DC
  Administered 2016-04-08: 15:00:00 via INTRAVENOUS
  Administered 2016-04-09: 1000 mL via INTRAVENOUS

## 2016-04-08 MED ORDER — FENTANYL CITRATE (PF) 100 MCG/2ML IJ SOLN
25.0000 ug | Freq: Once | INTRAMUSCULAR | Status: AC
  Administered 2016-04-08: 25 ug via INTRAVENOUS
  Filled 2016-04-08: qty 2

## 2016-04-08 MED ORDER — LOSARTAN POTASSIUM 50 MG PO TABS
50.0000 mg | ORAL_TABLET | Freq: Two times a day (BID) | ORAL | Status: DC
  Administered 2016-04-08 – 2016-04-09 (×2): 50 mg via ORAL
  Filled 2016-04-08 (×2): qty 1

## 2016-04-08 MED ORDER — OXYCODONE HCL 5 MG PO TABS
5.0000 mg | ORAL_TABLET | ORAL | Status: DC | PRN
  Administered 2016-04-08 – 2016-04-09 (×6): 5 mg via ORAL
  Filled 2016-04-08 (×6): qty 1

## 2016-04-08 MED ORDER — MONTELUKAST SODIUM 10 MG PO TABS
10.0000 mg | ORAL_TABLET | Freq: Every day | ORAL | Status: DC
  Administered 2016-04-08 – 2016-04-09 (×2): 10 mg via ORAL
  Filled 2016-04-08 (×2): qty 1

## 2016-04-08 MED ORDER — IOPAMIDOL (ISOVUE-300) INJECTION 61%
100.0000 mL | Freq: Once | INTRAVENOUS | Status: AC | PRN
  Administered 2016-04-08: 100 mL via INTRAVENOUS

## 2016-04-08 MED ORDER — MORPHINE SULFATE (PF) 2 MG/ML IV SOLN
2.0000 mg | Freq: Once | INTRAVENOUS | Status: AC
  Administered 2016-04-08: 2 mg via INTRAVENOUS
  Filled 2016-04-08: qty 1

## 2016-04-08 MED ORDER — ZOLPIDEM TARTRATE 10 MG PO TABS: 5.0000 mg | ORAL_TABLET | Freq: Every evening | ORAL | Status: DC | PRN

## 2016-04-08 MED ORDER — KETOROLAC TROMETHAMINE 15 MG/ML IJ SOLN
15.0000 mg | Freq: Four times a day (QID) | INTRAMUSCULAR | Status: DC | PRN
  Administered 2016-04-08 – 2016-04-09 (×2): 15 mg via INTRAVENOUS
  Filled 2016-04-08 (×2): qty 1

## 2016-04-08 MED ORDER — SODIUM CHLORIDE 0.9 % IV BOLUS (SEPSIS)
1000.0000 mL | Freq: Once | INTRAVENOUS | Status: AC
  Administered 2016-04-08: 1000 mL via INTRAVENOUS

## 2016-04-08 MED ORDER — PANTOPRAZOLE SODIUM 40 MG PO TBEC
40.0000 mg | DELAYED_RELEASE_TABLET | Freq: Every day | ORAL | Status: DC
  Administered 2016-04-09: 40 mg via ORAL
  Filled 2016-04-08: qty 1

## 2016-04-08 MED ORDER — ENOXAPARIN SODIUM 40 MG/0.4ML ~~LOC~~ SOLN
40.0000 mg | SUBCUTANEOUS | Status: DC
  Administered 2016-04-08 – 2016-04-09 (×2): 40 mg via SUBCUTANEOUS
  Filled 2016-04-08 (×2): qty 0.4

## 2016-04-08 MED ORDER — ONDANSETRON HCL 4 MG/2ML IJ SOLN
4.0000 mg | Freq: Four times a day (QID) | INTRAMUSCULAR | Status: DC | PRN
  Administered 2016-04-08 – 2016-04-09 (×3): 4 mg via INTRAVENOUS
  Filled 2016-04-08 (×3): qty 2

## 2016-04-08 MED ORDER — ONDANSETRON HCL 4 MG/2ML IJ SOLN
4.0000 mg | Freq: Once | INTRAMUSCULAR | Status: AC
  Administered 2016-04-08: 4 mg via INTRAVENOUS
  Filled 2016-04-08: qty 2

## 2016-04-08 MED ORDER — ALPRAZOLAM 0.25 MG PO TABS: 0.2500 mg | ORAL_TABLET | Freq: Once | ORAL | Status: DC

## 2016-04-08 MED ORDER — BISACODYL 10 MG RE SUPP: 10.0000 mg | Freq: Every day | RECTAL | Status: DC | PRN
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
## 2016-04-08 NOTE — Progress Notes (Signed)
Spoke with J Samtani - Small layering right hemothorax

## 2016-04-08 NOTE — ED Notes (Addendum)
Called floor to give report, Rollene Fare will call back.   Report given to [facility]

## 2016-04-08 NOTE — ED Notes (Signed)
Pt family member states pt is nauseated and she has ongoing problems with nausea due lack of intake.

## 2016-04-08 NOTE — ED Provider Notes (Signed)
[facility] DEPT Provider Note   CSN: [id number] Arrival date & time: 04/08/16  0631     History   Chief Complaint Chief Complaint  Patient presents with  . Fall    HPI Michele [name] is a 82 y.o. female.  HPI 82 year old female with past medical history of hypertension, COPD, recurrent UTIs who presents with fall. Patient states she was walking in the bathroom today. When she was trying to sit down on the toilet, she slipped and hit her right side on the back of the toilet. Denies any head injury or loss of consciousness. She is not on blood thinners. She reports immediate onset of sharp, pleuritic, right-sided chest wall pain that is worse with movement and palpation. Pain is also worse with taking a deep breath. Denies any alleviating factors. Denies any abdominal pain. She has nausea but this is chronic for her. She also endorses mildly decreased by mouth intake this morning due to pain. Denies any diarrhea. Denies any hip pain. No other medical complaints. Denies any neck pain or stiffness.  Past Medical History:  Diagnosis Date  . Asthma   . COPD (chronic obstructive pulmonary disease) ([place])   . Cystocele 08/2011  . Diverticulosis   . GERD (gastroesophageal reflux disease)   . Hematuria    GU workup  . Hiatal hernia   . Hypertension   . Melanoma ([place]) 11/2001  . Osteoarthritis   . Osteoporosis   . Scoliosis   . UTI (urinary tract infection)    septic    Patient Active Problem List   Diagnosis Date Noted  . Cystocele 08/18/2012  . Sepsis secondary to UTI ([place]) 04/27/2012  . E coli bacteremia 04/27/2012  . Elevation of cardiac enzymes 04/27/2012  . Hypertension 04/27/2012  . Asthma 04/27/2012  . Nausea 04/27/2012    Past Surgical History:  Procedure Laterality Date  . HYSTEROSCOPY  06/1998   D&C-Benign  . MELANOMA EXCISION      OB History    Gravida Para Term Preterm AB Living   3 3       3    SAB TAB Ectopic Multiple Live Births                    Home Medications    Prior to Admission medications   Medication Sig Start Date End Date Taking? Authorizing Provider  ALPRAZolam Duanne Moron) 0.25 MG tablet as needed. Reported on 09/06/2015 06/14/15   Historical Provider, MD  losartan (COZAAR) 50 MG tablet Take 1 tablet by mouth 2 (two) times daily. 10/13/14   Historical Provider, MD  montelukast (SINGULAIR) 10 MG tablet Take 1 tablet by mouth daily. 12/15/14   Historical Provider, MD  Multiple Vitamin (MULTIVITAMIN WITH MINERALS) TABS Take 1 tablet by mouth daily.    Historical Provider, MD  pantoprazole (PROTONIX) 40 MG tablet Take 1 tablet (40 mg total) by mouth daily at 6 (six) AM. 04/30/12   Burnard Bunting, MD  VITAMIN E PO Take by mouth daily.    Historical Provider, MD  zolpidem (AMBIEN) 10 MG tablet Take 10 mg by mouth at bedtime as needed. For sleep    Historical Provider, MD    Family History Family History  Problem Relation Age of Onset  . Hypertension Father   . Heart attack Father   . Colon cancer Neg Hx     Social History Social History  Substance Use Topics  . Smoking status: Never Smoker  . Smokeless tobacco: Never Used  . Alcohol  use No     Comment: cocktail      Allergies   Penicillins and Pneumococcal vaccines   Review of Systems Review of Systems  Constitutional: Negative for chills, fatigue and fever.  HENT: Negative for congestion and rhinorrhea.   Eyes: Negative for visual disturbance.  Respiratory: Negative for cough, shortness of breath and wheezing.   Cardiovascular: Positive for chest pain. Negative for leg swelling.  Gastrointestinal: Positive for nausea. Negative for abdominal pain, diarrhea and vomiting.  Genitourinary: Negative for dysuria and flank pain.  Musculoskeletal: Negative for neck pain and neck stiffness.  Skin: Negative for rash and wound.  Allergic/Immunologic: Negative for immunocompromised state.  Neurological: Negative for syncope, weakness and headaches.  All other systems  reviewed and are negative.    Physical Exam Updated Vital Signs BP 197/99 (BP Location: Right Arm)   Pulse 71   Temp 97.5 F (36.4 C)   Resp 16   Ht 5\' 2"  (1.575 m)   Wt 114 lb (51.7 kg)   LMP 05/26/1997   SpO2 94%   BMI 20.85 kg/m   Physical Exam  Constitutional: She is oriented to person, place, and time. She appears well-developed and well-nourished. No distress.  HENT:  Head: Normocephalic and atraumatic.  Eyes: Conjunctivae are normal.  Neck: Neck supple.  Cardiovascular: Normal rate, regular rhythm and normal heart sounds.  Exam reveals no friction rub.   No murmur heard. Pulmonary/Chest: Effort normal and breath sounds normal. No respiratory distress. She has no wheezes. She has no rales. She exhibits tenderness (Mild right-sided chest wall tenderness to palpation over lateral, inferior ribs. Mild bruising. No deformity.).  Abdominal: She exhibits no distension.  Musculoskeletal: She exhibits no edema.  Neurological: She is alert and oriented to person, place, and time. She exhibits normal muscle tone.  Skin: Skin is warm. Capillary refill takes less than 2 seconds.  Psychiatric: She has a normal mood and affect.  Nursing note and vitals reviewed.    ED Treatments / Results  Labs (all labs ordered are listed, but only abnormal results are displayed) Labs Reviewed  CBC WITH DIFFERENTIAL/PLATELET  COMPREHENSIVE METABOLIC PANEL    EKG  EKG Interpretation None       Radiology No results found.  Procedures Procedures (including critical care time)  Medications Ordered in ED Medications  fentaNYL (SUBLIMAZE) injection 25 mcg (not administered)  sodium chloride 0.9 % bolus 1,000 mL (not administered)     Initial Impression / Assessment and Plan / ED Course  I have reviewed the triage vital signs and the nursing notes.  Pertinent labs & imaging results that were available during my care of the patient were reviewed by me and considered in my medical  decision making (see chart for details).  Clinical Course as of Apr 09 1631  Tue Apr 08, 2016  1055 CT Abdomen Pelvis W Contrast [CI]    Clinical Course User Index [CI] Duffy Bruce, MD    82 yo F with PMHx as above who p/w right chest wall pain s/p mechanical fall. No head injury or LOC, pt not on blood thinners, and neuro exam is unremarkable at this time. Plain films show displaced R sided rib fx - given location, TTP, and c/f possible additional fx, hepatic injury, or renal injury.  CT scans show displaced right 8-10th rib fx, with small hemothorax. No PTX. No intra-abdominal injury. Lab work is unremarkable. Discussed with Trauma Surgery/CCS, who has evaluated. Will admit to hospitalist for pain control, pulmonary toilet. VS  remains stable. HTX is small with no active bleeding.  Final Clinical Impressions(s) / ED Diagnoses   Final diagnoses:  Closed fracture of multiple ribs of right side, initial encounter  Hemothorax      Duffy Bruce, MD 04/08/16 [phone]

## 2016-04-08 NOTE — ED Triage Notes (Addendum)
Per EMS, pt is from [facility] with c/o pain 7/10 in right flank due to an unwitnessed fall when trying to sit on the toilet. Pt crawled from the bathroom to her bed. Denies LOC and hitting head. EMS reports having 2 bruises to right side. Denies chest pain. Pt is alert and oriented. Pt reported that she does not take anticoagulants. EMS administered 4mg  of zofran intravenously.

## 2016-04-08 NOTE — Consult Note (Signed)
[facility] Surgery Consult Note  ODESTER NILSON 11-19-1926  [id number].    Requesting MD: Ellender Hose, MD Chief Complaint/Reason for Consult: Fall   HPI:  82 y/o female with a PMH GERD, asthma, hypertension, COPD, cystocele and recurrent urinary tract infections who presented to [facility] after a fall in her bathroom. Her daughter in the room with her today. Patient resides at [place] and lives independently in an apartment . She reports falling at 4 AM and hitting her right side on a cabinet when attempting to sit down on the toilet. She denies hitting her head or LOC. She reports sharp pain over her right chest wall that is exacerbated by deep breathing, movement, and palpation. Denies any alleviating factors. Patient denies the use of blood thinning medications. Denies alcohol/tobacco use. Never smoker. At baseline she ambulates independently - denies use of cane/walker.  PCP - Dr Karren Burly? She denies HA, visual changes, tinnitus, palpitations, abdominal pain, vomiting, diarrhea, hematochezia, melena, or urinary symptoms.   ED workup: CT scan chest/abd/pelv significant for segmental, displaced rib fractures of ribs 8-10, nondisplaced fracture of rib 7. Small right hemothorax. Chest wall emphysema. No PTX. No intra-abdominal injuries.   WBC 16.6  Hyponatremia -131  UA negative for UTI   ROS: All systems reviewed and otherwise negative except for as above  Family History  Problem Relation Age of Onset  . Hypertension Father   . Heart attack Father   . Colon cancer Neg Hx     Past Medical History:  Diagnosis Date  . Asthma   . COPD (chronic obstructive pulmonary disease) ([place])   . Cystocele 08/2011  . Diverticulosis   . GERD (gastroesophageal reflux disease)   . Hematuria    GU workup  . Hiatal hernia   . Hypertension   . Melanoma ([place]) 11/2001  . Osteoarthritis   . Osteoporosis   . Scoliosis   . UTI (urinary tract infection)    septic    Past Surgical History:   Procedure Laterality Date  . HYSTEROSCOPY  06/1998   D&C-Benign  . MELANOMA EXCISION      Social History:  reports that she has never smoked. She has never used smokeless tobacco. She reports that she does not drink alcohol or use drugs.  Allergies:  Allergies  Allergen Reactions  . Penicillins Other (See Comments)    Reaction unknown Has patient had a PCN reaction causing immediate rash, facial/tongue/throat swelling, SOB or lightheadedness with hypotension: n/a Has patient had a PCN reaction causing severe rash involving mucus membranes or skin necrosis: n/a Has patient had a PCN reaction that required hospitalization: n/a Has patient had a PCN reaction occurring within the last 10 years: n/a If all of the above answers are "NO", then may proceed with Cephalosporin use.   . Pneumococcal Vaccines Other (See Comments)    Reaction unknown     (Not in a hospital admission)  Blood pressure 178/95, pulse 74, temperature 97.5 F (36.4 C), resp. rate 14, height '5\' 2"'  (1.575 m), weight 114 lb (51.7 kg), last menstrual period 05/26/1997, SpO2 94 %. Physical Exam: General: pleasant, thin white female who is laying in bed in NAD HEENT: head is normocephalic, atraumatic.  Sclera are noninjected.  PERRL.   Mouth is pink and moist Heart: regular, rate, and rhythm.  No obvious murmurs, gallops, or rubs noted.  Palpable pedal pulses bilaterally Lungs: TTP right chest wall, CTABL with diminished breath sounds in bilateral lung bases Abd: soft, NT/ND, +BS, no masses, hernias,  or organomegaly MS: all 4 extremities are symmetrical with no cyanosis, clubbing, or edema. Skin: warm and dry with no masses, lesions, or rashes Psych: A&Ox3 with an appropriate affect. Neuro: moves all extremities spontaneously, normal speech  Results for orders placed or performed during the hospital encounter of 04/08/16 (from the past 48 hour(s))  CBC with Differential     Status: Abnormal   Collection Time:  04/08/16  7:58 AM  Result Value Ref Range   WBC 16.6 (H) 4.0 - 10.5 K/uL   RBC 4.02 3.87 - 5.11 MIL/uL   Hemoglobin 13.1 12.0 - 15.0 g/dL   HCT 37.2 36.0 - 46.0 %   MCV 92.5 78.0 - 100.0 fL   MCH 32.6 26.0 - 34.0 pg   MCHC 35.2 30.0 - 36.0 g/dL   RDW 12.2 11.5 - 15.5 %   Platelets 297 150 - 400 K/uL   Neutrophils Relative % 89 %   Neutro Abs 14.8 (H) 1.7 - 7.7 K/uL   Lymphocytes Relative 5 %   Lymphs Abs 0.8 0.7 - 4.0 K/uL   Monocytes Relative 6 %   Monocytes Absolute 1.0 0.1 - 1.0 K/uL   Eosinophils Relative 0 %   Eosinophils Absolute 0.0 0.0 - 0.7 K/uL   Basophils Relative 0 %   Basophils Absolute 0.0 0.0 - 0.1 K/uL  Comprehensive metabolic panel     Status: Abnormal   Collection Time: 04/08/16  7:58 AM  Result Value Ref Range   Sodium 131 (L) 135 - 145 mmol/L   Potassium 4.4 3.5 - 5.1 mmol/L    Comment: MODERATE HEMOLYSIS   Chloride 95 (L) 101 - 111 mmol/L   CO2 26 22 - 32 mmol/L   Glucose, Bld 121 (H) 65 - 99 mg/dL   BUN 17 6 - 20 mg/dL   Creatinine, Ser 0.75 0.44 - 1.00 mg/dL   Calcium 9.1 8.9 - 10.3 mg/dL   Total Protein 6.9 6.5 - 8.1 g/dL   Albumin 4.4 3.5 - 5.0 g/dL   AST 47 (H) 15 - 41 U/L   ALT 18 14 - 54 U/L   Alkaline Phosphatase 49 38 - 126 U/L   Total Bilirubin 1.1 0.3 - 1.2 mg/dL   GFR calc non Af Amer >60 >60 mL/min   GFR calc Af Amer >60 >60 mL/min    Comment: (NOTE) The eGFR has been calculated using the CKD EPI equation. This calculation has not been validated in all clinical situations. eGFR's persistently <60 mL/min signify possible Chronic Kidney Disease.    Anion gap 10 5 - 15  Urinalysis, Routine w reflex microscopic (not at [facility])     Status: Abnormal   Collection Time: 04/08/16  8:54 AM  Result Value Ref Range   Color, Urine YELLOW YELLOW   APPearance CLEAR CLEAR   Specific Gravity, Urine 1.010 1.005 - 1.030   pH 7.5 5.0 - 8.0   Glucose, UA NEGATIVE NEGATIVE mg/dL   Hgb urine dipstick TRACE (A) NEGATIVE   Bilirubin Urine NEGATIVE  NEGATIVE   Ketones, ur NEGATIVE NEGATIVE mg/dL   Protein, ur NEGATIVE NEGATIVE mg/dL   Nitrite NEGATIVE NEGATIVE   Leukocytes, UA NEGATIVE NEGATIVE  Urine microscopic-add on     Status: Abnormal   Collection Time: 04/08/16  8:54 AM  Result Value Ref Range   Squamous Epithelial / LPF 0-5 (A) NONE SEEN   WBC, UA 0-5 0 - 5 WBC/hpf   RBC / HPF 0-5 0 - 5 RBC/hpf   Bacteria, UA RARE (  A) NONE SEEN   Dg Ribs Unilateral W/chest Right  Result Date: 04/08/2016 CLINICAL DATA:  Right lateral chest wall pain after fall. Initial encounter. EXAM: RIGHT RIBS AND CHEST - 3+ VIEW COMPARISON:  None. FINDINGS: Eleven paired ribs. Displaced right posterior and lateral eighth and ninth rib fractures. No hemothorax or pneumothorax noted. No evidence of lung contusion. Severe S shaped scoliosis of the thoracolumbar spine. Moderate hiatal hernia. IMPRESSION: Displaced right eighth and ninth rib fractures. No hemothorax or pneumothorax. Electronically Signed   By: Monte Fantasia M.D.   On: 04/08/2016 07:56   Ct Chest W Contrast  Result Date: 04/08/2016 CLINICAL DATA:  Fall with right rib fractures.  Initial encounter. EXAM: CT CHEST, ABDOMEN, AND PELVIS WITH CONTRAST TECHNIQUE: Multidetector CT imaging of the chest, abdomen and pelvis was performed following the standard protocol during bolus administration of intravenous contrast. CONTRAST:  174m ISOVUE-300 IOPAMIDOL (ISOVUE-300) INJECTION 61% COMPARISON:  Rib series from earlier today FINDINGS: CT CHEST FINDINGS Cardiovascular: No cardiomegaly or pericardial effusion. No evidence of great vessel injury. Mediastinum/Nodes: Moderate hiatal hernia. No hematoma or adenopathy. Lungs/Pleura: Small dependent right pleural effusion which measures 30-40 Hounsfield units. The underlying lung is mildly atelectatic. Extrapleural gas without convincing pneumothorax. Extrathoracic soft tissue emphysema. Negative left chest. Musculoskeletal: See below CT ABDOMEN PELVIS FINDINGS  Hepatobiliary: No evidence of injury Pancreas: Prominence of the main pancreatic duct without lesion or injury. No evidence of injury Spleen: Negative Adrenals/Urinary Tract: No evidence of injury. No hydronephrosis. Renal cortical scarring on the left. Small cortical cysts. Stomach/Bowel: No evidence of injury. Extensive colonic diverticulosis. Vascular/Lymphatic: Atheromatous calcification wall thickening on the aorta and branch vessels. No evidence of injury. Reproductive: Expected atrophy of pelvic organs.  Vaginal ring. Musculoskeletal: 11 paired ribs. Nondisplaced lateral right seventh rib fracture. Segmental and displaced right eighth, ninth, and tenth posterior rib fractures. S shaped scoliosis with severe lumbar levocurvature and moderate thoracic dextrocurvature. Advanced asymmetric disc narrowing and facet arthropathy, especially in the lumbar spine. No acute spinal fracture is noted. IMPRESSION: 1. Segmental, displaced right eighth, ninth, and tenth rib fractures. Nondisplaced right seventh rib fracture. 2. Small layering right hemothorax. Chest wall emphysema without pneumothorax. 3. No evidence of intra-abdominal injury. Electronically Signed   By: JMonte FantasiaM.D.   On: 04/08/2016 11:38   Ct Abdomen Pelvis W Contrast  Result Date: 04/08/2016 CLINICAL DATA:  Fall with right rib fractures.  Initial encounter. EXAM: CT CHEST, ABDOMEN, AND PELVIS WITH CONTRAST TECHNIQUE: Multidetector CT imaging of the chest, abdomen and pelvis was performed following the standard protocol during bolus administration of intravenous contrast. CONTRAST:  1020mISOVUE-300 IOPAMIDOL (ISOVUE-300) INJECTION 61% COMPARISON:  Rib series from earlier today FINDINGS: CT CHEST FINDINGS Cardiovascular: No cardiomegaly or pericardial effusion. No evidence of great vessel injury. Mediastinum/Nodes: Moderate hiatal hernia. No hematoma or adenopathy. Lungs/Pleura: Small dependent right pleural effusion which measures 30-40  Hounsfield units. The underlying lung is mildly atelectatic. Extrapleural gas without convincing pneumothorax. Extrathoracic soft tissue emphysema. Negative left chest. Musculoskeletal: See below CT ABDOMEN PELVIS FINDINGS Hepatobiliary: No evidence of injury Pancreas: Prominence of the main pancreatic duct without lesion or injury. No evidence of injury Spleen: Negative Adrenals/Urinary Tract: No evidence of injury. No hydronephrosis. Renal cortical scarring on the left. Small cortical cysts. Stomach/Bowel: No evidence of injury. Extensive colonic diverticulosis. Vascular/Lymphatic: Atheromatous calcification wall thickening on the aorta and branch vessels. No evidence of injury. Reproductive: Expected atrophy of pelvic organs.  Vaginal ring. Musculoskeletal: 11 paired ribs. Nondisplaced lateral right seventh rib  fracture. Segmental and displaced right eighth, ninth, and tenth posterior rib fractures. S shaped scoliosis with severe lumbar levocurvature and moderate thoracic dextrocurvature. Advanced asymmetric disc narrowing and facet arthropathy, especially in the lumbar spine. No acute spinal fracture is noted. IMPRESSION: 1. Segmental, displaced right eighth, ninth, and tenth rib fractures. Nondisplaced right seventh rib fracture. 2. Small layering right hemothorax. Chest wall emphysema without pneumothorax. 3. No evidence of intra-abdominal injury. Electronically Signed   By: Monte Fantasia M.D.   On: 04/08/2016 11:38   Assessment/Plan Fall, ground level, without LOC Multiple rib fractures, right  Hemothorax, right    Admit to medical service for pain control, pulmonary toilet, and incentive spirometry   Repeat chest x-ray and CBC/BMET in AM    continue protonix   No acute surgical needs. We will continue to follow.   Jill Alexanders, [facility] Surgery 04/08/2016, 12:24 PM Pager: [phone] Consults: [phone] Mon-Fri 7:00 am-4:30 pm Sat-Sun 7:00 am-11:30 am

## 2016-04-08 NOTE — H&P (Addendum)
[facility] History and Physical  Michele [name] [id number] DOB: 1927-04-18 DOA: 04/08/2016  Referring physician: ED PCP: Geoffery Lyons, MD  Specialists: Trauma  Chief Complaint:   HPI:  82 y/o ? Resident of independent living facility Prior candidal esophagitis 09/2014 Uterine prolapse with pessary Htn Prior Diverticuolisis Asthma  Was trying to get out of bed to go to the restroom 04/08/2016 a.m. 4 AM and might of stumbled and hit the sink as well as covered adjacent to toilet Fell onto the ground and crawled to telephone called her daughter and was brought over to [facility] emergency room Is chronically unsteady on her feet and has a history of scoliosis dating back to when she was younger child Still drives and is independent however move to [facility] about 6 months to 1 year ago  Found on evaluation on CT chest abdomen and pelvis to have nondisplaced lateral right seventh rib fracture as well as displaced right 8, ninth, 10th posterior rib fractures and severe lumbar levo curvature and moderate thoracic dextro curvature noted also found was a small layering right hemothorax without pneumothorax  Never smoked and never drinker Usually ambulates without any assistance    Past Medical History:  Diagnosis Date  . Asthma   . COPD (chronic obstructive pulmonary disease) ([place])   . Cystocele 08/2011  . Diverticulosis   . GERD (gastroesophageal reflux disease)   . Hematuria    GU workup  . Hiatal hernia   . Hypertension   . Melanoma ([place]) 11/2001  . Osteoarthritis   . Osteoporosis   . Scoliosis   . UTI (urinary tract infection)    septic   Past Surgical History:  Procedure Laterality Date  . HYSTEROSCOPY  06/1998   D&C-Benign  . MELANOMA EXCISION     Social History:  Social History   Social History Narrative  . No narrative on file    Allergies  Allergen Reactions  . Penicillins Other (See Comments)    Reaction unknown Has patient had a  PCN reaction causing immediate rash, facial/tongue/throat swelling, SOB or lightheadedness with hypotension: n/a Has patient had a PCN reaction causing severe rash involving mucus membranes or skin necrosis: n/a Has patient had a PCN reaction that required hospitalization: n/a Has patient had a PCN reaction occurring within the last 10 years: n/a If all of the above answers are "NO", then may proceed with Cephalosporin use.   . Pneumococcal Vaccines Other (See Comments)    Reaction unknown    Family History  Problem Relation Age of Onset  . Hypertension Father   . Heart attack Father   . Colon cancer Neg Hx     Prior to Admission medications   Medication Sig Start Date End Date Taking? Authorizing Provider  ALPRAZolam (XANAX) 0.25 MG tablet Take 0.25 mg by mouth once. To take once prior to dental exams 06/14/15  Yes Historical Provider, MD  losartan (COZAAR) 50 MG tablet Take 50 mg by mouth 2 (two) times daily.  10/13/14  Yes Historical Provider, MD  montelukast (SINGULAIR) 10 MG tablet Take 10 mg by mouth daily.  12/15/14  Yes Historical Provider, MD  Multiple Vitamin (MULTIVITAMIN WITH MINERALS) TABS Take 1 tablet by mouth daily.   Yes Historical Provider, MD  pantoprazole (PROTONIX) 40 MG tablet Take 1 tablet (40 mg total) by mouth daily at 6 (six) AM. 04/30/12  Yes Burnard Bunting, MD  VITAMIN E PO Take 1 tablet by mouth daily.    Yes Historical Provider, MD  zolpidem (AMBIEN) 10 MG tablet Take 10 mg by mouth at bedtime as needed for sleep.    Yes Historical Provider, MD   Physical Exam: Vitals:   04/08/16 0635 04/08/16 0636 04/08/16 1133  BP:  197/99 178/95  Pulse:  71 74  Resp:  16 14  Temp:  97.5 F (36.4 C)   SpO2:  94% 94%  Weight: 51.7 kg (114 lb)    Height: 5\' 2"  (1.575 m)      Alert wasn't oriented no distress no icterus no pallor neck is soft supple question thyromegaly no JVD no bruit S1 and S2 no murmur rub or gallop Chest is clinically clear with no added sound  tenderness in the right axilla and right chest wall Abdomen soft nontender nondistended no rebound no guarding Lower Extremities and soft without any swelling Range of motion of lower extremities and tach she is able to bend her knees has good sensation Examination of cervical spine reveals no deficit and no step-off   Labs on Admission:  Basic Metabolic Panel:  Recent Labs Lab 04/08/16 0758  NA 131*  K 4.4  CL 95*  CO2 26  GLUCOSE 121*  BUN 17  CREATININE 0.75  CALCIUM 9.1   Liver Function Tests:  Recent Labs Lab 04/08/16 0758  AST 47*  ALT 18  ALKPHOS 49  BILITOT 1.1  PROT 6.9  ALBUMIN 4.4   No results for input(s): LIPASE, AMYLASE in the last 168 hours. No results for input(s): AMMONIA in the last 168 hours. CBC:  Recent Labs Lab 04/08/16 0758  WBC 16.6*  NEUTROABS 14.8*  HGB 13.1  HCT 37.2  MCV 92.5  PLT 297   Cardiac Enzymes: No results for input(s): CKTOTAL, CKMB, CKMBINDEX, TROPONINI in the last 168 hours.  BNP (last 3 results) No results for input(s): BNP in the last 8760 hours.  ProBNP (last 3 results) No results for input(s): PROBNP in the last 8760 hours.  CBG: No results for input(s): GLUCAP in the last 168 hours.  Radiological Exams on Admission: Dg Ribs Unilateral W/chest Right  Result Date: 04/08/2016 CLINICAL DATA:  Right lateral chest wall pain after fall. Initial encounter. EXAM: RIGHT RIBS AND CHEST - 3+ VIEW COMPARISON:  None. FINDINGS: Eleven paired ribs. Displaced right posterior and lateral eighth and ninth rib fractures. No hemothorax or pneumothorax noted. No evidence of lung contusion. Severe S shaped scoliosis of the thoracolumbar spine. Moderate hiatal hernia. IMPRESSION: Displaced right eighth and ninth rib fractures. No hemothorax or pneumothorax. Electronically Signed   By: Monte Fantasia M.D.   On: 04/08/2016 07:56   Ct Chest W Contrast  Result Date: 04/08/2016 CLINICAL DATA:  Fall with right rib fractures.  Initial  encounter. EXAM: CT CHEST, ABDOMEN, AND PELVIS WITH CONTRAST TECHNIQUE: Multidetector CT imaging of the chest, abdomen and pelvis was performed following the standard protocol during bolus administration of intravenous contrast. CONTRAST:  144mL ISOVUE-300 IOPAMIDOL (ISOVUE-300) INJECTION 61% COMPARISON:  Rib series from earlier today FINDINGS: CT CHEST FINDINGS Cardiovascular: No cardiomegaly or pericardial effusion. No evidence of great vessel injury. Mediastinum/Nodes: Moderate hiatal hernia. No hematoma or adenopathy. Lungs/Pleura: Small dependent right pleural effusion which measures 30-40 Hounsfield units. The underlying lung is mildly atelectatic. Extrapleural gas without convincing pneumothorax. Extrathoracic soft tissue emphysema. Negative left chest. Musculoskeletal: See below CT ABDOMEN PELVIS FINDINGS Hepatobiliary: No evidence of injury Pancreas: Prominence of the main pancreatic duct without lesion or injury. No evidence of injury Spleen: Negative Adrenals/Urinary Tract: No evidence of injury. No  hydronephrosis. Renal cortical scarring on the left. Small cortical cysts. Stomach/Bowel: No evidence of injury. Extensive colonic diverticulosis. Vascular/Lymphatic: Atheromatous calcification wall thickening on the aorta and branch vessels. No evidence of injury. Reproductive: Expected atrophy of pelvic organs.  Vaginal ring. Musculoskeletal: 11 paired ribs. Nondisplaced lateral right seventh rib fracture. Segmental and displaced right eighth, ninth, and tenth posterior rib fractures. S shaped scoliosis with severe lumbar levocurvature and moderate thoracic dextrocurvature. Advanced asymmetric disc narrowing and facet arthropathy, especially in the lumbar spine. No acute spinal fracture is noted. IMPRESSION: 1. Segmental, displaced right eighth, ninth, and tenth rib fractures. Nondisplaced right seventh rib fracture. 2. Small layering right hemothorax. Chest wall emphysema without pneumothorax. 3. No  evidence of intra-abdominal injury. Electronically Signed   By: Monte Fantasia M.D.   On: 04/08/2016 11:38   Ct Abdomen Pelvis W Contrast  Result Date: 04/08/2016 CLINICAL DATA:  Fall with right rib fractures.  Initial encounter. EXAM: CT CHEST, ABDOMEN, AND PELVIS WITH CONTRAST TECHNIQUE: Multidetector CT imaging of the chest, abdomen and pelvis was performed following the standard protocol during bolus administration of intravenous contrast. CONTRAST:  154mL ISOVUE-300 IOPAMIDOL (ISOVUE-300) INJECTION 61% COMPARISON:  Rib series from earlier today FINDINGS: CT CHEST FINDINGS Cardiovascular: No cardiomegaly or pericardial effusion. No evidence of great vessel injury. Mediastinum/Nodes: Moderate hiatal hernia. No hematoma or adenopathy. Lungs/Pleura: Small dependent right pleural effusion which measures 30-40 Hounsfield units. The underlying lung is mildly atelectatic. Extrapleural gas without convincing pneumothorax. Extrathoracic soft tissue emphysema. Negative left chest. Musculoskeletal: See below CT ABDOMEN PELVIS FINDINGS Hepatobiliary: No evidence of injury Pancreas: Prominence of the main pancreatic duct without lesion or injury. No evidence of injury Spleen: Negative Adrenals/Urinary Tract: No evidence of injury. No hydronephrosis. Renal cortical scarring on the left. Small cortical cysts. Stomach/Bowel: No evidence of injury. Extensive colonic diverticulosis. Vascular/Lymphatic: Atheromatous calcification wall thickening on the aorta and branch vessels. No evidence of injury. Reproductive: Expected atrophy of pelvic organs.  Vaginal ring. Musculoskeletal: 11 paired ribs. Nondisplaced lateral right seventh rib fracture. Segmental and displaced right eighth, ninth, and tenth posterior rib fractures. S shaped scoliosis with severe lumbar levocurvature and moderate thoracic dextrocurvature. Advanced asymmetric disc narrowing and facet arthropathy, especially in the lumbar spine. No acute spinal fracture  is noted. IMPRESSION: 1. Segmental, displaced right eighth, ninth, and tenth rib fractures. Nondisplaced right seventh rib fracture. 2. Small layering right hemothorax. Chest wall emphysema without pneumothorax. 3. No evidence of intra-abdominal injury. Electronically Signed   By: Monte Fantasia M.D.   On: 04/08/2016 11:38    EKG: Independently reviewed. none  Assessment/Plan  Fall with right-sided rib fractures and displacement as well as right hemothorax Trauma surgery to see patient and give further recommendations however unlikely will need any type of surgery Place on incentive spirometry Repeat chest x-ray in 48 hours Keep O2 sats on Therapy evaluation  Asthma diagnosed in her 61's She is not on any specific types of inhalers and I will consider this is mild or intermittent only May continue Singulair 10 daily Monitor for oxygen needs  Hypertension Okay to continue losartan 50 twice a day  Repeat basic metabolic panel a.m.  Euvolemichyponatremia Saline 50 cc/h Rpt labs am  Presbycusis Patient should get her hearing aids so we can talk to her clearly  Reflux continue Protonix 40 daily  Uterine prolapse-continue with her regular obstetrician Dr. Talbert Nan  DNR Inpatient Medsurg Expect 24-48 hrstay,might nee dSNF    [place], [facility] Pager [phone]  If 7PM-7AM, please contact  night-coverage [email or web address] Password [email or web address] 04/08/2016, 12:19 PM

## 2016-04-08 NOTE — ED Notes (Signed)
Bed: [id number] Expected date:  Expected time:  Means of arrival:  Comments: 82 yo F/ Fall flank pain

## 2016-04-09 ENCOUNTER — Observation Stay (HOSPITAL_COMMUNITY): Payer: Medicare Other

## 2016-04-09 DIAGNOSIS — D72829 Elevated white blood cell count, unspecified: Secondary | ICD-10-CM

## 2016-04-09 DIAGNOSIS — J942 Hemothorax: Secondary | ICD-10-CM | POA: Diagnosis not present

## 2016-04-09 DIAGNOSIS — S2249XA Multiple fractures of ribs, unspecified side, initial encounter for closed fracture: Secondary | ICD-10-CM

## 2016-04-09 DIAGNOSIS — G4700 Insomnia, unspecified: Secondary | ICD-10-CM

## 2016-04-09 DIAGNOSIS — J4520 Mild intermittent asthma, uncomplicated: Secondary | ICD-10-CM

## 2016-04-09 DIAGNOSIS — I10 Essential (primary) hypertension: Secondary | ICD-10-CM

## 2016-04-09 DIAGNOSIS — S2241XA Multiple fractures of ribs, right side, initial encounter for closed fracture: Principal | ICD-10-CM

## 2016-04-09 DIAGNOSIS — K219 Gastro-esophageal reflux disease without esophagitis: Secondary | ICD-10-CM

## 2016-04-09 LAB — CBC
HEMATOCRIT: 34.9 % — AB (ref 36.0–46.0)
HEMOGLOBIN: 11.7 g/dL — AB (ref 12.0–15.0)
MCH: 31.4 pg (ref 26.0–34.0)
MCHC: 33.5 g/dL (ref 30.0–36.0)
MCV: 93.6 fL (ref 78.0–100.0)
Platelets: 257 10*3/uL (ref 150–400)
RBC: 3.73 MIL/uL — ABNORMAL LOW (ref 3.87–5.11)
RDW: 12.4 % (ref 11.5–15.5)
WBC: 8.6 10*3/uL (ref 4.0–10.5)

## 2016-04-09 LAB — COMPREHENSIVE METABOLIC PANEL
ALBUMIN: 3.8 g/dL (ref 3.5–5.0)
ALT: 15 U/L (ref 14–54)
ANION GAP: 8 (ref 5–15)
AST: 25 U/L (ref 15–41)
Alkaline Phosphatase: 46 U/L (ref 38–126)
BUN: 19 mg/dL (ref 6–20)
CHLORIDE: 99 mmol/L — AB (ref 101–111)
CO2: 25 mmol/L (ref 22–32)
Calcium: 8.7 mg/dL — ABNORMAL LOW (ref 8.9–10.3)
Creatinine, Ser: 0.93 mg/dL (ref 0.44–1.00)
GFR calc Af Amer: >60 mL/min (ref >60)
GFR calc non Af Amer: 54 mL/min — ABNORMAL LOW (ref >60)
GLUCOSE: 96 mg/dL (ref 65–99)
POTASSIUM: 3.5 mmol/L (ref 3.5–5.1)
SODIUM: 132 mmol/L — AB (ref 135–145)
Total Bilirubin: 1.1 mg/dL (ref 0.3–1.2)
Total Protein: 6.1 g/dL — ABNORMAL LOW (ref 6.5–8.1)

## 2016-04-09 MED ORDER — DOCUSATE SODIUM 100 MG PO CAPS: 100.0000 mg | ORAL_CAPSULE | Freq: Two times a day (BID) | ORAL | Status: DC

## 2016-04-09 MED ORDER — ACETAMINOPHEN 500 MG PO TABS: 500.0000 mg | ORAL_TABLET | Freq: Three times a day (TID) | ORAL | 0 refills | Status: DC

## 2016-04-09 MED ORDER — ZOLPIDEM TARTRATE 10 MG PO TABS: 10.0000 mg | ORAL_TABLET | Freq: Every evening | ORAL | 0 refills | Status: DC | PRN

## 2016-04-09 MED ORDER — POLYETHYLENE GLYCOL 3350 17 G PO PACK: 17.0000 g | PACK | Freq: Every day | ORAL | Status: DC

## 2016-04-09 MED ORDER — OXYCODONE HCL 5 MG PO TABS: 5.0000 mg | ORAL_TABLET | ORAL | 0 refills | Status: DC | PRN

## 2016-04-09 NOTE — Progress Notes (Signed)
MD notified earlier on the results of CXR.

## 2016-04-09 NOTE — Progress Notes (Signed)
Report given to [name] ,nurse at [facility]. All questions answered appropriately.

## 2016-04-09 NOTE — Progress Notes (Signed)
Patient discharge to [facility] via non emergency ambulance. Stable.

## 2016-04-09 NOTE — Clinical Social Work Note (Signed)
Clinical Social Work Assessment  Patient Details  Name: Michele [name] MRN: [id number] Date of Birth: 11/04/1926  Date of referral:  04/08/16               Reason for consult:  Discharge Planning, Facility Placement                Permission sought to share information with:  Facility Contact Representative Permission granted to share information::  Yes, Verbal Permission Granted  Name::        Agency::     Relationship::     Contact Information:     Housing/Transportation Living arrangements for the past 2 months:  Independent Living Facility Source of Information:  Patient, Adult Children Patient Interpreter Needed:  None Criminal Activity/Legal Involvement Pertinent to Current Situation/Hospitalization:  No - Comment as needed Significant Relationships:  Adult Children Lives with:  Self Do you feel safe going back to the place where you live?  No (SNF needed.) Need for family participation in patient care:  Yes (Comment)  Care giving concerns:  Pt's care cannot be managed at independent living following hospital d/c.   Social Worker assessment / plan:  Pt hospitalized on 04/08/16 from [facility] after fall with right-sided rib fractures and displacement as well as right hemothorax. CSW met with pt / daughter this am to assist with d/c planning. Pt plans to return to [facility] at d/c. PT eval is pending. Pt / daughter feel ST Rehab at [facility] is needed. CSW has contacted [facility], clinicals sent, and d/c plan confirmed. [facility] will have rehab bed available for pt at d/c. CSW will continue to follow to assist with d/c planning to SNF.  Employment status:    Insurance information:  Managed Medicare PT Recommendations:  Not assessed at this time Information / Referral to community resources:  Skilled Nursing Facility  Patient/Family's Response to care:  Pt / daughter are in agreement with SNF at [facility].  Patient/Family's Understanding  of and Emotional Response to Diagnosis, Current Treatment, and Prognosis:  " I'm in a lot of pain when I move. " Pt / daughter are waiting to see MD for medical update. Pt will most likely need SNF at d/c. PT eval is pending. Pt / daughter appreciate CSW assistance with d/c planning.  Emotional Assessment Appearance:  Appears stated age Attitude/Demeanor/Rapport:  Other (Cooperative) Affect (typically observed):  Appropriate, Pleasant, Calm Orientation:  Oriented to Self, Oriented to Place, Oriented to  Time, Oriented to Situation Alcohol / Substance use:  Not Applicable Psych involvement (Current and /or in the community):  No (Comment)  Discharge Needs  Concerns to be addressed:  Discharge Planning Concerns Readmission within the last 30 days:    Current discharge risk:  None Barriers to Discharge:  No Barriers Identified   ,  Lee, LCSW  [phone]  04/09/2016, 10:01 AM  

## 2016-04-09 NOTE — Discharge Summary (Signed)
Physician Discharge Summary  Michele [name] [id number] DOB: 03-16-1927 DOA: 04/08/2016  PCP: Geoffery Lyons, MD  Admit date: 04/08/2016 Discharge date: 04/09/2016  Time spent: 35 minutes  Recommendations for Outpatient Follow-up:  1. Repeat CXR in 4-6 weeks 2. Repeat CBC to follow level/trend of WBC's and Hgb 3. Repeat BMET to follow electrolytes and renal function    Discharge Diagnoses:  Active Problems:   Ribs, multiple fractures   Multiple closed fractures of ribs of right side   Hemothorax on right   Leukocytosis HTN GERD Insomnia  Asthma Hyponatremia  Discharge Condition: stable and improved. Discharge to SNF for further care, rehabilitation and conditioning. Follow up with PCP in 14 days.  Diet recommendation: heart healthy diet   Filed Weights   04/08/16 0635 04/08/16 1555  Weight: 51.7 kg (114 lb) 51.3 kg (113 lb)    History of present illness:  82 y/o female with a PMH GERD, asthma, hypertension, COPD, cystocele and recurrent urinary tract infections who presented to [facility] after a fall in her bathroom. Patient resides at [facility] and lives independently in an apartment . She reports falling at 4 AM and hitting her right side on a cabinet when attempting to sit down on the toilet. She denies hitting her head or LOC. She reports sharp pain over her right chest wall that is exacerbated by deep breathing, movement, and palpation. Denies any alleviating factors. Patient denies the use of blood thinning medications. Denies alcohol/tobacco use. Never smoker. At baseline she ambulates independently - denies use of cane/walker.   ED workup: CT scan chest/abd/pelv significant for segmental, displaced rib fractures of ribs 8-10, nondisplaced fracture of rib 7. Small right hemothorax. Chest wall emphysema. No PTX. No intra-abdominal injuries.  WBC 16.6   Hospital Course:  1-Fall with Right side rib fractures (eight, nine and tenth ribs): no displaced, but with mild  layering hemothorax. -on repeat images unchanged  -after discussing with general surgery no intervention anticipated -will need incentive spirometry and use of Flutter valve -Tylenol TID and PRN oxycodone for pain control -patient discharge to SNF short term for rehabilitation and conditioning  -follow up with PCP in 14 days  2-Asthma/COPD: has never smoke -will continue use of singulair -no signs of active problem currently  3-GERD: -will continue PPI  4-hyponatremia: -improved with gentle hydration -at discharge Na level in 132 range; had sodium in 134 in outpatient bloodwork -will recommend BMET at follow up visit  5-HTN -stable overall -will continue heart healthy diet and will continue home losartan regimen   6-Insomnia: -continue PRN Zolpidem   7-Leukocytosis -due to demargination most likely -resolved after IVF's -CBC at follow up with follow level/trend recommended   Procedures:  See below for x-ray reports   Consultations:  General surgery (Dr. Harlow Asa, trauma service)  Discharge Exam: Vitals:   04/08/16 2018 04/09/16 0656  BP: (!) 142/77 (!) 178/93  Pulse: 85 78  Resp: 16 16  Temp: 97.4 F (36.3 C) 97.9 F (36.6 C)    General: afebrile, in no major distress and complaining of rib cage pain with movements (right side). No SOB Cardiovascular: S1 and S2, no rubs, no gallops Respiratory: good air movement, no wheezing  Abdomen: soft, NT, ND, possible BS LE: no edema or cyanosis   Discharge Instructions   Discharge Instructions    Diet - low sodium heart healthy    Complete by:  As directed    Discharge instructions    Complete by:  As directed  Maintain adequate hydration Take medications as prescribed Rehabilitation as per SNF protocol Arrange follow up with PCP in 14 days Will recommend Continue use of incentive spirometry and Flutter Valve     Current Discharge Medication List    START taking these medications   Details   acetaminophen (TYLENOL) 500 MG tablet Take 1 tablet (500 mg total) by mouth 3 (three) times daily. Qty: 30 tablet, Refills: 0    docusate sodium (COLACE) 100 MG capsule Take 1 capsule (100 mg total) by mouth 2 (two) times daily.    oxyCODONE (OXY IR/ROXICODONE) 5 MG immediate release tablet Take 1 tablet (5 mg total) by mouth every 4 (four) hours as needed for breakthrough pain. Qty: 30 tablet, Refills: 0    polyethylene glycol (MIRALAX) packet Take 17 g by mouth daily. Hold for diarrhea      CONTINUE these medications which have CHANGED   Details  zolpidem (AMBIEN) 10 MG tablet Take 1 tablet (10 mg total) by mouth at bedtime as needed for sleep. Qty: 20 tablet, Refills: 0      CONTINUE these medications which have NOT CHANGED   Details  ALPRAZolam (XANAX) 0.25 MG tablet Take 0.25 mg by mouth once. To take once prior to dental exams Refills: 3    losartan (COZAAR) 50 MG tablet Take 50 mg by mouth 2 (two) times daily.     montelukast (SINGULAIR) 10 MG tablet Take 10 mg by mouth daily.  Refills: 3    Multiple Vitamin (MULTIVITAMIN WITH MINERALS) TABS Take 1 tablet by mouth daily.    pantoprazole (PROTONIX) 40 MG tablet Take 1 tablet (40 mg total) by mouth daily at 6 (six) AM. Qty: 30 tablet, Refills: 0    VITAMIN E PO Take 1 tablet by mouth daily.        Allergies  Allergen Reactions  . Penicillins Other (See Comments)    Reaction unknown Has patient had a PCN reaction causing immediate rash, facial/tongue/throat swelling, SOB or lightheadedness with hypotension: n/a Has patient had a PCN reaction causing severe rash involving mucus membranes or skin necrosis: n/a Has patient had a PCN reaction that required hospitalization: n/a Has patient had a PCN reaction occurring within the last 10 years: n/a If all of the above answers are "NO", then may proceed with Cephalosporin use.   . Pneumococcal Vaccines Other (See Comments)    Reaction unknown    Contact information for  follow-up providers    ARONSON,RICHARD A, MD. Schedule an appointment as soon as possible for a visit in 2 week(s).   Specialty:  Internal Medicine Contact information: [address] [place] [address] [phone]            Contact information for after-discharge care    Destination    [facility] Follow up.   Specialties:  [place] Contact information: [place] [phone]                  The results of significant diagnostics from this hospitalization (including imaging, microbiology, ancillary and laboratory) are listed below for reference.    Significant Diagnostic Studies: Dg Chest 2 View  Result Date: 04/09/2016 CLINICAL DATA:  Multiple closed fractures of ribs of right side; hemothorax on right. Hx of asthma, COPD, HTN. EXAM: CHEST - 2 VIEW COMPARISON:  04/08/2016 FINDINGS: Multiple displaced right rib fractures as before. No pneumothorax. Bilateral small pleural effusions, . Adjacent atelectasis/consolidation in the lower  lobes left greater than right, slightly increased since previous. Heart size upper limits normal for technique. Tortuous thoracic aorta. Thoracic dextroscoliosis with mild multilevel degenerative change. IMPRESSION: 1. Persistent small bilateral effusions with some increase in bibasilar atelectasis/consolidation, left greater than right. Electronically Signed   By: Lucrezia Europe M.D.   On: 04/09/2016 10:57   Dg Ribs Unilateral W/chest Right  Result Date: 04/08/2016 CLINICAL DATA:  Right lateral chest wall pain after fall. Initial encounter. EXAM: RIGHT RIBS AND CHEST - 3+ VIEW COMPARISON:  None. FINDINGS: Eleven paired ribs. Displaced right posterior and lateral eighth and ninth rib fractures. No hemothorax or pneumothorax noted. No evidence of lung contusion. Severe S shaped scoliosis of the thoracolumbar spine. Moderate  hiatal hernia. IMPRESSION: Displaced right eighth and ninth rib fractures. No hemothorax or pneumothorax. Electronically Signed   By: Monte Fantasia M.D.   On: 04/08/2016 07:56   Ct Chest W Contrast  Result Date: 04/08/2016 CLINICAL DATA:  Fall with right rib fractures.  Initial encounter. EXAM: CT CHEST, ABDOMEN, AND PELVIS WITH CONTRAST TECHNIQUE: Multidetector CT imaging of the chest, abdomen and pelvis was performed following the standard protocol during bolus administration of intravenous contrast. CONTRAST:  13mL ISOVUE-300 IOPAMIDOL (ISOVUE-300) INJECTION 61% COMPARISON:  Rib series from earlier today FINDINGS: CT CHEST FINDINGS Cardiovascular: No cardiomegaly or pericardial effusion. No evidence of great vessel injury. Mediastinum/Nodes: Moderate hiatal hernia. No hematoma or adenopathy. Lungs/Pleura: Small dependent right pleural effusion which measures 30-40 Hounsfield units. The underlying lung is mildly atelectatic. Extrapleural gas without convincing pneumothorax. Extrathoracic soft tissue emphysema. Negative left chest. Musculoskeletal: See below CT ABDOMEN PELVIS FINDINGS Hepatobiliary: No evidence of injury Pancreas: Prominence of the main pancreatic duct without lesion or injury. No evidence of injury Spleen: Negative Adrenals/Urinary Tract: No evidence of injury. No hydronephrosis. Renal cortical scarring on the left. Small cortical cysts. Stomach/Bowel: No evidence of injury. Extensive colonic diverticulosis. Vascular/Lymphatic: Atheromatous calcification wall thickening on the aorta and branch vessels. No evidence of injury. Reproductive: Expected atrophy of pelvic organs.  Vaginal ring. Musculoskeletal: 11 paired ribs. Nondisplaced lateral right seventh rib fracture. Segmental and displaced right eighth, ninth, and tenth posterior rib fractures. S shaped scoliosis with severe lumbar levocurvature and moderate thoracic dextrocurvature. Advanced asymmetric disc narrowing and facet  arthropathy, especially in the lumbar spine. No acute spinal fracture is noted. IMPRESSION: 1. Segmental, displaced right eighth, ninth, and tenth rib fractures. Nondisplaced right seventh rib fracture. 2. Small layering right hemothorax. Chest wall emphysema without pneumothorax. 3. No evidence of intra-abdominal injury. Electronically Signed   By: Monte Fantasia M.D.   On: 04/08/2016 11:38   Ct Abdomen Pelvis W Contrast  Result Date: 04/08/2016 CLINICAL DATA:  Fall with right rib fractures.  Initial encounter. EXAM: CT CHEST, ABDOMEN, AND PELVIS WITH CONTRAST TECHNIQUE: Multidetector CT imaging of the chest, abdomen and pelvis was performed following the standard protocol during bolus administration of intravenous contrast. CONTRAST:  143mL ISOVUE-300 IOPAMIDOL (ISOVUE-300) INJECTION 61% COMPARISON:  Rib series from earlier today FINDINGS: CT CHEST FINDINGS Cardiovascular: No cardiomegaly or pericardial effusion. No evidence of great vessel injury. Mediastinum/Nodes: Moderate hiatal hernia. No hematoma or adenopathy. Lungs/Pleura: Small dependent right pleural effusion which measures 30-40 Hounsfield units. The underlying lung is mildly atelectatic. Extrapleural gas without convincing pneumothorax. Extrathoracic soft tissue emphysema. Negative left chest. Musculoskeletal: See below CT ABDOMEN PELVIS FINDINGS Hepatobiliary: No evidence of injury Pancreas: Prominence of the main pancreatic duct without lesion or injury. No evidence of injury Spleen: Negative Adrenals/Urinary Tract: No evidence of  injury. No hydronephrosis. Renal cortical scarring on the left. Small cortical cysts. Stomach/Bowel: No evidence of injury. Extensive colonic diverticulosis. Vascular/Lymphatic: Atheromatous calcification wall thickening on the aorta and branch vessels. No evidence of injury. Reproductive: Expected atrophy of pelvic organs.  Vaginal ring. Musculoskeletal: 11 paired ribs. Nondisplaced lateral right seventh rib  fracture. Segmental and displaced right eighth, ninth, and tenth posterior rib fractures. S shaped scoliosis with severe lumbar levocurvature and moderate thoracic dextrocurvature. Advanced asymmetric disc narrowing and facet arthropathy, especially in the lumbar spine. No acute spinal fracture is noted. IMPRESSION: 1. Segmental, displaced right eighth, ninth, and tenth rib fractures. Nondisplaced right seventh rib fracture. 2. Small layering right hemothorax. Chest wall emphysema without pneumothorax. 3. No evidence of intra-abdominal injury. Electronically Signed   By: Monte Fantasia M.D.   On: 04/08/2016 11:38    Microbiology: Recent Results (from the past 240 hour(s))  MRSA PCR Screening     Status: None   Collection Time: 04/08/16  4:51 PM  Result Value Ref Range Status   MRSA by PCR NEGATIVE NEGATIVE Final    Comment:        The GeneXpert MRSA Assay (FDA approved for NASAL specimens only), is one component of a comprehensive MRSA colonization surveillance program. It is not intended to diagnose MRSA infection nor to guide or monitor treatment for MRSA infections.      Labs: Basic Metabolic Panel:  Recent Labs Lab 04/08/16 0758 04/09/16 0403  NA 131* 132*  K 4.4 3.5  CL 95* 99*  CO2 26 25  GLUCOSE 121* 96  BUN 17 19  CREATININE 0.75 0.93  CALCIUM 9.1 8.7*   Liver Function Tests:  Recent Labs Lab 04/08/16 0758 04/09/16 0403  AST 47* 25  ALT 18 15  ALKPHOS 49 46  BILITOT 1.1 1.1  PROT 6.9 6.1*  ALBUMIN 4.4 3.8   CBC:  Recent Labs Lab 04/08/16 0758 04/09/16 0403  WBC 16.6* 8.6  NEUTROABS 14.8*  --   HGB 13.1 11.7*  HCT 37.2 34.9*  MCV 92.5 93.6  PLT 297 257    Signed:  Barton Dubois MD.  [facility] 04/09/2016, 2:34 PM

## 2016-04-09 NOTE — Progress Notes (Signed)
Pt / family are in agreement with d/c plan to return to [facility] today. Pt will go to rehab unit at [facility]. [facility]AR transport required. Medical necessity form completed. D/C Summary sent to SNF for review. Scripts included in d/c packet. # for report provided to nsg.

## 2016-04-09 NOTE — Progress Notes (Signed)
General Surgery [facility]  Assessment & Plan:  Multiple right rib fractures with small hemothorax  Await CXR this AM - will review  Encourage pulmonary toilet  Pain control  Per medical service - will follow        Earnstine Regal, MD, [facility]       Office: [phone]    Subjective: Patient up in bed, eating breakfast, complains of nausea.  Objective: Vital signs in last 24 hours: Temp:  [97 F (36.1 C)-97.9 F (36.6 C)] 97.9 F (36.6 C) (11/15 0656) Pulse Rate:  [74-90] 78 (11/15 0656) Resp:  [14-16] 16 (11/15 0656) BP: (142-178)/(77-101) 178/93 (11/15 0656) SpO2:  [93 %-100 %] 99 % (11/15 0656) Weight:  [51.3 kg (113 lb)] 51.3 kg (113 lb) (11/14 1555) Last BM Date: 04/07/16  Intake/Output from previous day: 11/14 0701 - 11/15 0700 In: 1854.2 [P.O.:60; I.V.:794.2; IV Piggyback:1000] Out: 175 [Urine:175] Intake/Output this shift: No intake/output data recorded.  Physical Exam: HEENT - sclerae clear, mucous membranes moist Neck - soft Chest - clear bilaterally with reasonable volumes; moderate tenderness right lateral chest wall, no crepitance Neuro - alert & oriented, no focal deficits  Lab Results:   Recent Labs  04/08/16 0758 04/09/16 0403  WBC 16.6* 8.6  HGB 13.1 11.7*  HCT 37.2 34.9*  PLT 297 257   BMET  Recent Labs  04/08/16 0758 04/09/16 0403  NA 131* 132*  K 4.4 3.5  CL 95* 99*  CO2 26 25  GLUCOSE 121* 96  BUN 17 19  CREATININE 0.75 0.93  CALCIUM 9.1 8.7*   PT/INR No results for input(s): LABPROT, INR in the last 72 hours. Comprehensive Metabolic Panel:    Component Value Date/Time   NA 132 (L) 04/09/2016 0403   NA 131 (L) 04/08/2016 0758   K 3.5 04/09/2016 0403   K 4.4 04/08/2016 0758   CL 99 (L) 04/09/2016 0403   CL 95 (L) 04/08/2016 0758   CO2 25 04/09/2016 0403   CO2 26 04/08/2016 0758   BUN 19 04/09/2016 0403   BUN 17 04/08/2016 0758   CREATININE 0.93  04/09/2016 0403   CREATININE 0.75 04/08/2016 0758   GLUCOSE 96 04/09/2016 0403   GLUCOSE 121 (H) 04/08/2016 0758   CALCIUM 8.7 (L) 04/09/2016 0403   CALCIUM 9.1 04/08/2016 0758   AST 25 04/09/2016 0403   AST 47 (H) 04/08/2016 0758   ALT 15 04/09/2016 0403   ALT 18 04/08/2016 0758   ALKPHOS 46 04/09/2016 0403   ALKPHOS 49 04/08/2016 0758   BILITOT 1.1 04/09/2016 0403   BILITOT 1.1 04/08/2016 0758   PROT 6.1 (L) 04/09/2016 0403   PROT 6.9 04/08/2016 0758   ALBUMIN 3.8 04/09/2016 0403   ALBUMIN 4.4 04/08/2016 0758    Studies/Results: Dg Ribs Unilateral W/chest Right  Result Date: 04/08/2016 CLINICAL DATA:  Right lateral chest wall pain after fall. Initial encounter. EXAM: RIGHT RIBS AND CHEST - 3+ VIEW COMPARISON:  None. FINDINGS: Eleven paired ribs. Displaced right posterior and lateral eighth and ninth rib fractures. No hemothorax or pneumothorax noted. No evidence of lung contusion. Severe S shaped scoliosis of the thoracolumbar spine. Moderate hiatal hernia. IMPRESSION: Displaced right eighth and ninth rib fractures. No hemothorax or pneumothorax. Electronically Signed   By: Monte Fantasia M.D.   On: 04/08/2016 07:56   Ct Chest W Contrast  Result Date: 04/08/2016 CLINICAL DATA:  Fall with right rib fractures.  Initial  encounter. EXAM: CT CHEST, ABDOMEN, AND PELVIS WITH CONTRAST TECHNIQUE: Multidetector CT imaging of the chest, abdomen and pelvis was performed following the standard protocol during bolus administration of intravenous contrast. CONTRAST:  158mL ISOVUE-300 IOPAMIDOL (ISOVUE-300) INJECTION 61% COMPARISON:  Rib series from earlier today FINDINGS: CT CHEST FINDINGS Cardiovascular: No cardiomegaly or pericardial effusion. No evidence of great vessel injury. Mediastinum/Nodes: Moderate hiatal hernia. No hematoma or adenopathy. Lungs/Pleura: Small dependent right pleural effusion which measures 30-40 Hounsfield units. The underlying lung is mildly atelectatic. Extrapleural gas  without convincing pneumothorax. Extrathoracic soft tissue emphysema. Negative left chest. Musculoskeletal: See below CT ABDOMEN PELVIS FINDINGS Hepatobiliary: No evidence of injury Pancreas: Prominence of the main pancreatic duct without lesion or injury. No evidence of injury Spleen: Negative Adrenals/Urinary Tract: No evidence of injury. No hydronephrosis. Renal cortical scarring on the left. Small cortical cysts. Stomach/Bowel: No evidence of injury. Extensive colonic diverticulosis. Vascular/Lymphatic: Atheromatous calcification wall thickening on the aorta and branch vessels. No evidence of injury. Reproductive: Expected atrophy of pelvic organs.  Vaginal ring. Musculoskeletal: 11 paired ribs. Nondisplaced lateral right seventh rib fracture. Segmental and displaced right eighth, ninth, and tenth posterior rib fractures. S shaped scoliosis with severe lumbar levocurvature and moderate thoracic dextrocurvature. Advanced asymmetric disc narrowing and facet arthropathy, especially in the lumbar spine. No acute spinal fracture is noted. IMPRESSION: 1. Segmental, displaced right eighth, ninth, and tenth rib fractures. Nondisplaced right seventh rib fracture. 2. Small layering right hemothorax. Chest wall emphysema without pneumothorax. 3. No evidence of intra-abdominal injury. Electronically Signed   By: Monte Fantasia M.D.   On: 04/08/2016 11:38   Ct Abdomen Pelvis W Contrast  Result Date: 04/08/2016 CLINICAL DATA:  Fall with right rib fractures.  Initial encounter. EXAM: CT CHEST, ABDOMEN, AND PELVIS WITH CONTRAST TECHNIQUE: Multidetector CT imaging of the chest, abdomen and pelvis was performed following the standard protocol during bolus administration of intravenous contrast. CONTRAST:  118mL ISOVUE-300 IOPAMIDOL (ISOVUE-300) INJECTION 61% COMPARISON:  Rib series from earlier today FINDINGS: CT CHEST FINDINGS Cardiovascular: No cardiomegaly or pericardial effusion. No evidence of great vessel injury.  Mediastinum/Nodes: Moderate hiatal hernia. No hematoma or adenopathy. Lungs/Pleura: Small dependent right pleural effusion which measures 30-40 Hounsfield units. The underlying lung is mildly atelectatic. Extrapleural gas without convincing pneumothorax. Extrathoracic soft tissue emphysema. Negative left chest. Musculoskeletal: See below CT ABDOMEN PELVIS FINDINGS Hepatobiliary: No evidence of injury Pancreas: Prominence of the main pancreatic duct without lesion or injury. No evidence of injury Spleen: Negative Adrenals/Urinary Tract: No evidence of injury. No hydronephrosis. Renal cortical scarring on the left. Small cortical cysts. Stomach/Bowel: No evidence of injury. Extensive colonic diverticulosis. Vascular/Lymphatic: Atheromatous calcification wall thickening on the aorta and branch vessels. No evidence of injury. Reproductive: Expected atrophy of pelvic organs.  Vaginal ring. Musculoskeletal: 11 paired ribs. Nondisplaced lateral right seventh rib fracture. Segmental and displaced right eighth, ninth, and tenth posterior rib fractures. S shaped scoliosis with severe lumbar levocurvature and moderate thoracic dextrocurvature. Advanced asymmetric disc narrowing and facet arthropathy, especially in the lumbar spine. No acute spinal fracture is noted. IMPRESSION: 1. Segmental, displaced right eighth, ninth, and tenth rib fractures. Nondisplaced right seventh rib fracture. 2. Small layering right hemothorax. Chest wall emphysema without pneumothorax. 3. No evidence of intra-abdominal injury. Electronically Signed   By: Monte Fantasia M.D.   On: 04/08/2016 11:38      Raynold Blankenbaker M 04/09/2016  Patient ID: Michele [name], female   DOB: [birth year], 82 y.o.   MRN: [id number]

## 2016-04-09 NOTE — NC FL2 (Signed)
[place] LEVEL OF CARE SCREENING TOOL     IDENTIFICATION  Patient Name: Michele [name] Birthdate: February 03, 1927 Sex: female Admission Date (Current Location): 04/08/2016  [facility] and [place] Number:  Herbalist and Address:  [facility],  [place] [address] [place]      Provider Number: [id number]  Attending Physician Name and Address:  Barton Dubois, MD  Relative Name and Phone Number:       Current Level of Care: Hospital Recommended Level of Care: [place] Prior Approval Number:    Date Approved/Denied:   PASRR Number:    Discharge Plan: SNF    Current Diagnoses: Patient Active Problem List   Diagnosis Date Noted  . Ribs, multiple fractures 04/08/2016  . Cystocele 08/18/2012  . Sepsis secondary to UTI ([place]) 04/27/2012  . E coli bacteremia 04/27/2012  . Elevation of cardiac enzymes 04/27/2012  . Hypertension 04/27/2012  . Asthma 04/27/2012  . Nausea 04/27/2012    Orientation RESPIRATION BLADDER Height & Weight     Self, Time, Situation, Place  Normal Incontinent Weight: 113 lb (51.3 kg) Height:  5\' 1"  (154.9 cm)  BEHAVIORAL SYMPTOMS/MOOD NEUROLOGICAL BOWEL NUTRITION STATUS  Other (Comment) (No behaviors)   Continent Diet  AMBULATORY STATUS COMMUNICATION OF NEEDS Skin   Extensive Assist Verbally Normal                       Personal Care Assistance Level of Assistance  Bathing, Feeding, Dressing Bathing Assistance: Limited assistance Feeding assistance: Independent Dressing Assistance: Limited assistance     Functional Limitations Info  Sight, Hearing, Speech Sight Info: Adequate Hearing Info: Impaired Speech Info: Adequate    SPECIAL CARE FACTORS FREQUENCY  PT (By licensed PT), OT (By licensed OT)     PT Frequency: 5x wk OT Frequency: 5x wk            Contractures Contractures Info: Not present    Additional Factors Info  Code Status, Allergies Code Status Info:  DNR Allergies Info: Penicillins, Pneumococcal Vaccines           Current Medications (04/09/2016):  This is the current hospital active medication list Current Facility-Administered Medications  Medication Dose Route Frequency Provider Last Rate Last Dose  . 0.9 %  sodium chloride infusion   Intravenous Continuous Nita Sells, MD 50 mL/hr at 04/09/16 0029    . bisacodyl (DULCOLAX) suppository 10 mg  10 mg Rectal Daily PRN Nita Sells, MD      . enoxaparin (LOVENOX) injection 40 mg  40 mg Subcutaneous Q24H Nita Sells, MD   40 mg at 04/08/16 1628  . ketorolac (TORADOL) 15 MG/ML injection 15 mg  15 mg Intravenous Q6H PRN Nita Sells, MD   15 mg at 04/09/16 0946  . losartan (COZAAR) tablet 50 mg  50 mg Oral BID Nita Sells, MD   50 mg at 04/09/16 0942  . montelukast (SINGULAIR) tablet 10 mg  10 mg Oral Daily Nita Sells, MD   10 mg at 04/09/16 0942  . ondansetron (ZOFRAN) injection 4 mg  4 mg Intravenous Q6H PRN Jeryl Columbia, NP   4 mg at 04/09/16 0650  . oxyCODONE (Oxy IR/ROXICODONE) immediate release tablet 5 mg  5 mg Oral Q4H PRN Nita Sells, MD   5 mg at 04/09/16 0650  . pantoprazole (PROTONIX) EC tablet 40 mg  40 mg Oral Q0600 Nita Sells, MD   40 mg at 04/09/16 0650  . zolpidem (  AMBIEN) tablet 5 mg  5 mg Oral QHS PRN Nita Sells, MD         Discharge Medications: Please see discharge summary for a list of discharge medications.  Relevant Imaging Results:  Relevant Lab Results:   Additional Information SS # [id number]  Azoria Abbett, Randall An, LCSW

## 2016-04-09 NOTE — Evaluation (Signed)
Physical Therapy Evaluation Patient Details Name: OZZIE MORTELLARO MRN: [id number] DOB: 1927-01-24 Today's Date: 04/09/2016   History of Present Illness  82 yo female admitted after having a fall at home. Pt sustained R 8th and 9th rib fractures and R hemothorax. Hx of scoliosis.  Clinical Impression  On eval, pt required Mod assist for mobility. Mod encouragement and increased time required. Pt was able to sit EOB for ~2-3 minutes. Attempted sit to stand x3 but pt was unable to perform task due to significant pain. Discussed d/c plan and pt is agreeable to ST rehab at SNF at [facility].     Follow Up Recommendations SNF    Equipment Recommendations  None recommended by PT    Recommendations for Other Services       Precautions / Restrictions Precautions Precautions: Fall Restrictions Weight Bearing Restrictions: No      Mobility  Bed Mobility Overal bed mobility: Needs Assistance Bed Mobility: Supine to Sit;Sit to Sidelying;Rolling Rolling: Min guard   Supine to sit: HOB elevated;Mod assist   Sit to sidelying: Mod assist General bed mobility comments: Assist for trunk and LEs. Increased time. Multimodal cues and Mod encouragement for pt to complete task  Transfers Overall transfer level: Needs assistance Equipment used: 1 person hand held assist Transfers: Sit to/from Stand;Lateral/Scoot Transfers           General transfer comment: Attempted sit to stand x3. Pt unable to perform due to pain. Lateral scoot towards [facility] with Mod assist and use of bedpad to aid with scooting.   Ambulation/Gait                Stairs            Wheelchair Mobility    Modified Rankin (Stroke Patients Only)       Balance Overall balance assessment: Needs assistance   Sitting balance-Leahy Scale: Good                                       Pertinent Vitals/Pain Pain Assessment: Faces Faces Pain Scale: Hurts whole lot Pain Location: R side of  trunk Pain Descriptors / Indicators: Guarding;Grimacing;Sharp Pain Intervention(s): Limited activity within patient's tolerance;Repositioned    Home Living Family/patient expects to be discharged to:: Skilled nursing facility                      Prior Function Level of Independence: Independent               Hand Dominance        Extremity/Trunk Assessment   Upper Extremity Assessment: Generalized weakness           Lower Extremity Assessment: Generalized weakness      Cervical / Trunk Assessment:  (scoliosis)  Communication   Communication: No difficulties  Cognition Arousal/Alertness: Awake/alert Behavior During Therapy: WFL for tasks assessed/performed Overall Cognitive Status: Within Functional Limits for tasks assessed                      General Comments      Exercises     Assessment/Plan    PT Assessment Patient needs continued PT services  PT Problem List Decreased strength;Decreased mobility;Decreased activity tolerance;Decreased balance;Pain;Decreased knowledge of use of DME          PT Treatment Interventions DME instruction;Gait training;Therapeutic activities;Therapeutic exercise;Functional mobility training;Balance training;Patient/family education  PT Goals (Current goals can be found in the Care Plan section)  Acute Rehab PT Goals Patient Stated Goal: less pain.  PT Goal Formulation: With patient Time For Goal Achievement: 04/23/16 Potential to Achieve Goals: Good    Frequency Min 3X/week   Barriers to discharge        Co-evaluation               End of Session Equipment Utilized During Treatment: Gait belt Activity Tolerance: Patient limited by pain Patient left: in bed;with call bell/phone within reach;with bed alarm set      Functional Assessment Tool Used: clinical judgement Functional Limitation: Mobility: Walking and moving around Mobility: Walking and Moving Around Current Status  [id number]): At least 40 percent but less than 60 percent impaired, limited or restricted Mobility: Walking and Moving Around Goal Status [phone]): At least 20 percent but less than 40 percent impaired, limited or restricted    Time: 1110-1136 PT Time Calculation (min) (ACUTE ONLY): 26 min   Charges:   PT Evaluation $PT Eval Low Complexity: 1 Procedure PT Treatments $Therapeutic Activity: 8-22 mins   PT G Codes:   PT G-Codes **NOT FOR INPATIENT CLASS** Functional Assessment Tool Used: clinical judgement Functional Limitation: Mobility: Walking and moving around Mobility: Walking and Moving Around Current Status [id number]): At least 40 percent but less than 60 percent impaired, limited or restricted Mobility: Walking and Moving Around Goal Status [phone]): At least 20 percent but less than 40 percent impaired, limited or restricted    Weston Anna, MPT Pager: [phone]

## 2016-04-09 NOTE — Care Management CC44 (Signed)
Condition Code 44 Documentation Completed  Patient Details  Name: Michele [name] MRN: [id number] Date of Birth: [birth year]   Condition Code 44 given:  Yes Patient signature on Condition Code 44 notice:   (Pt declined to sign) Documentation of 2 MD's agreement:  Yes Code 44 added to claim:  Yes    Lynnell Catalan, RN 04/09/2016, 10:53 AM

## 2016-04-09 NOTE — Progress Notes (Signed)
Discharge instructions given to daughter,verbalized understanding. All questions answered by this Probation officer.

## 2016-04-15 ENCOUNTER — Non-Acute Institutional Stay (SKILLED_NURSING_FACILITY): Payer: Medicare Other | Admitting: Internal Medicine

## 2016-04-15 ENCOUNTER — Encounter: Payer: Self-pay | Admitting: Internal Medicine

## 2016-04-15 DIAGNOSIS — W19XXXA Unspecified fall, initial encounter: Secondary | ICD-10-CM

## 2016-04-15 DIAGNOSIS — J942 Hemothorax: Secondary | ICD-10-CM

## 2016-04-15 DIAGNOSIS — K219 Gastro-esophageal reflux disease without esophagitis: Secondary | ICD-10-CM

## 2016-04-15 DIAGNOSIS — J4520 Mild intermittent asthma, uncomplicated: Secondary | ICD-10-CM | POA: Diagnosis not present

## 2016-04-15 DIAGNOSIS — K5901 Slow transit constipation: Secondary | ICD-10-CM | POA: Diagnosis not present

## 2016-04-15 DIAGNOSIS — S2241XA Multiple fractures of ribs, right side, initial encounter for closed fracture: Secondary | ICD-10-CM

## 2016-04-15 DIAGNOSIS — I10 Essential (primary) hypertension: Secondary | ICD-10-CM

## 2016-04-15 LAB — BASIC METABOLIC PANEL
BUN: 15 mg/dL (ref 4–21)
CREATININE: 0.7 mg/dL (ref 0.5–1.1)
Glucose: 92 mg/dL
POTASSIUM: 4.3 mmol/L (ref 3.4–5.3)
Sodium: 136 mmol/L — AB (ref 137–147)

## 2016-04-15 LAB — CBC AND DIFFERENTIAL
HCT: 39 % (ref 36–46)
Hemoglobin: 13.1 g/dL (ref 12.0–16.0)
Platelets: 318 10*3/uL (ref 150–399)
WBC: 6.9 10*3/mL

## 2016-04-15 NOTE — Progress Notes (Signed)
Patient ID: Michele [name], female   DOB: April 21, 1927, 82 y.o.   MRN: [id number]  Provider:  Rexene Edison. Mariea Clonts, D.O., C.M.D. Location:  [place] Room Number: [place] of Service:  SNF (31)  PCP: Geoffery Lyons, MD Patient Care Team: Burnard Bunting, MD as PCP - General (Internal Medicine)  Extended Emergency Contact Information Primary Emergency Contact: Franchot Erichsen [place] Phone: [phone] Relation: Daughter   Code Status: DNR Goals of Care: Advanced Directive information Advanced Directives 04/15/2016  Does patient have an advance directive? Yes  Type of Advance Directive Out of facility DNR (pink MOST or yellow form);Living will;Healthcare Power of Attorney  Copy of advanced directive(s) in chart? Yes  Would patient like information on creating an advanced directive? -  Pre-existing out of facility DNR order (yellow form or pink MOST form) Yellow form placed in chart (order not valid for inpatient use)   Chief Complaint  Patient presents with  . New Admit To SNF    Rehab admission    HPI: Patient is a 82 [place] with h/o asthma/copd, gerd, hiatal hernia, htn, osteoporosis, melanoma who was seen today for admission to [facility] rehab s/p hospitalization for fall with rib fractures.  She fell against her sink in the bathroom in the middle of the night.  She was hospitalized from 11/14-15/17 with nondisplaced lateral right 7th rib fx, displaced right 8/9/10th posterior rib fxs, scoliosis and small layering right hemothorax on CT chest/abdomen/pelvis.  She came here on O2, incentive spirometry, PT.  It was recommended she have a f/u CXR in 4-6 wks, cbc and bmp.    When seen, she reported pain with movement, deep breaths and coughing on her right side.  She actually did not have that much bruising in the affected area.  She says she's mostly resting in the chair or bed unless she's doing her PT.  She had  no other complaints  Past Medical History:  Diagnosis Date  . Asthma   . COPD (chronic obstructive pulmonary disease) ([place])   . Cystocele 08/2011  . Diverticulosis   . GERD (gastroesophageal reflux disease)   . Hematuria    GU workup  . Hiatal hernia   . Hypertension   . Melanoma ([place]) 11/2001  . Osteoarthritis   . Osteoporosis   . Scoliosis   . UTI (urinary tract infection)    septic   Past Surgical History:  Procedure Laterality Date  . HYSTEROSCOPY  06/1998   D&C-Benign  . MELANOMA EXCISION      reports that she has never smoked. She has never used smokeless tobacco. She reports that she does not drink alcohol or use drugs. Social History   Social History  . Marital status: Widowed    Spouse name: N/A  . Number of children: N/A  . Years of education: N/A   Occupational History  . Not on file.   Social History Main Topics  . Smoking status: Never Smoker  . Smokeless tobacco: Never Used  . Alcohol use No     Comment: cocktail   . Drug use: No  . Sexual activity: No   Other Topics Concern  . Not on file   Social History Narrative  . No narrative on file    Functional Status Survey:    Family History  Problem Relation Age of Onset  . Hypertension Father   . Heart attack Father   . Colon cancer Neg Hx  Health Maintenance  Topic Date Due  . TETANUS/TDAP  09/12/1947  . ZOSTAVAX  09/11/1988  . INFLUENZA VACCINE  12/25/2015  . MAMMOGRAM  07/11/2016  . DEXA SCAN  Completed    Allergies  Allergen Reactions  . Penicillins Other (See Comments)    Reaction unknown Has patient had a PCN reaction causing immediate rash, facial/tongue/throat swelling, SOB or lightheadedness with hypotension: n/a Has patient had a PCN reaction causing severe rash involving mucus membranes or skin necrosis: n/a Has patient had a PCN reaction that required hospitalization: n/a Has patient had a PCN reaction occurring within the last 10 years: n/a If all of the above  answers are "NO", then may proceed with Cephalosporin use.   . Pneumococcal Vaccines Other (See Comments)    Reaction unknown      Medication List       Accurate as of 04/15/16  9:33 AM. Always use your most recent med list.          docusate sodium 100 MG capsule Commonly known as:  COLACE Take 1 capsule (100 mg total) by mouth 2 (two) times daily.   feeding supplement Liqd Take 1 Container by mouth 2 (two) times daily between meals.   losartan 50 MG tablet Commonly known as:  COZAAR Take 50 mg by mouth 2 (two) times daily.   Melatonin 5 MG Tabs Take 1 tablet by mouth at bedtime as needed.   montelukast 10 MG tablet Commonly known as:  SINGULAIR Take 10 mg by mouth daily.   multivitamin with minerals Tabs tablet Take 1 tablet by mouth daily.   ondansetron 4 MG tablet Commonly known as:  ZOFRAN Take 4 mg by mouth every 8 (eight) hours as needed for nausea.   oxyCODONE 5 MG immediate release tablet Commonly known as:  Oxy IR/ROXICODONE Take 5 mg by mouth every 6 (six) hours as needed for severe pain.   pantoprazole 40 MG tablet Commonly known as:  PROTONIX Take 40 mg by mouth 2 (two) times daily before a meal.   polyethylene glycol packet Commonly known as:  MIRALAX Take 17 g by mouth daily. Hold for diarrhea   TYLENOL 500 MG tablet Generic drug:  acetaminophen Take 1,000 mg by mouth 3 (three) times daily.   VITAMIN E PO Take 1 tablet by mouth daily.       Review of Systems  Constitutional: Positive for activity change. Negative for appetite change, chills and fever.  HENT: Negative for congestion.   Eyes: Negative for visual disturbance.  Respiratory: Negative for apnea, cough, choking, chest tightness, shortness of breath, wheezing and stridor.   Cardiovascular: Positive for chest pain. Negative for palpitations and leg swelling.       On right side and posteriorly when takes deep breath or coughs  Gastrointestinal: Positive for abdominal pain.  Negative for abdominal distention.       Right upper quadrant which must be radiating from ribs  Genitourinary: Negative for dysuria.  Musculoskeletal: Positive for arthralgias and gait problem.       Since fall and fxs  Neurological: Negative for dizziness.  Hematological: Bruises/bleeds easily.  Psychiatric/Behavioral: Negative for agitation, confusion and suicidal ideas.    Vitals:   04/15/16 0921  BP: (!) 142/92  Pulse: 70  Resp: 20  Temp: 97.3 F (36.3 C)  TempSrc: Oral  SpO2: 97%   There is no height or weight on file to calculate BMI. Physical Exam  Constitutional: She is oriented to person, place, and time. She  appears well-developed and well-nourished.  Distressed when she tries to sit up for part of the exam  HENT:  Head: Normocephalic and atraumatic.  Right Ear: External ear normal.  Left Ear: External ear normal.  Mouth/Throat: Oropharynx is clear and moist.  Eyes: Conjunctivae are normal. Pupils are equal, round, and reactive to light.  Neck: Neck supple. No JVD present.  Cardiovascular: Normal rate, regular rhythm, normal heart sounds and intact distal pulses.   Pulmonary/Chest: Effort normal and breath sounds normal. No respiratory distress. She has no wheezes.  Splinting when taking deep breaths  Abdominal: Soft. Bowel sounds are normal. She exhibits no distension and no mass. There is tenderness. There is no rebound and no guarding.  RUQ  Musculoskeletal: Normal range of motion.  Lymphadenopathy:    She has no cervical adenopathy.  Neurological: She is alert and oriented to person, place, and time. No cranial nerve deficit. Coordination normal.  Skin: Skin is warm and dry.  Psychiatric: She has a normal mood and affect.    Labs reviewed: Basic Metabolic Panel:  Recent Labs  04/08/16 0758 04/09/16 0403  NA 131* 132*  K 4.4 3.5  CL 95* 99*  CO2 26 25  GLUCOSE 121* 96  BUN 17 19  CREATININE 0.75 0.93  CALCIUM 9.1 8.7*   Liver Function  Tests:  Recent Labs  04/08/16 0758 04/09/16 0403  AST 47* 25  ALT 18 15  ALKPHOS 49 46  BILITOT 1.1 1.1  PROT 6.9 6.1*  ALBUMIN 4.4 3.8   No results for input(s): LIPASE, AMYLASE in the last 8760 hours. No results for input(s): AMMONIA in the last 8760 hours. CBC:  Recent Labs  04/08/16 0758 04/09/16 0403  WBC 16.6* 8.6  NEUTROABS 14.8*  --   HGB 13.1 11.7*  HCT 37.2 34.9*  MCV 92.5 93.6  PLT 297 257   Cardiac Enzymes: No results for input(s): CKTOTAL, CKMB, CKMBINDEX, TROPONINI in the last 8760 hours. BNP: Invalid input(s): POCBNP No results found for: HGBA1C No results found for: TSH No results found for: VITAMINB12 No results found for: FOLATE No results found for: IRON, TIBC, FERRITIN  Imaging and Procedures obtained prior to SNF admission: Dg Chest 2 View  Result Date: 04/09/2016 CLINICAL DATA:  Multiple closed fractures of ribs of right side; hemothorax on right. Hx of asthma, COPD, HTN. EXAM: CHEST - 2 VIEW COMPARISON:  04/08/2016 FINDINGS: Multiple displaced right rib fractures as before. No pneumothorax. Bilateral small pleural effusions, . Adjacent atelectasis/consolidation in the lower lobes left greater than right, slightly increased since previous. Heart size upper limits normal for technique. Tortuous thoracic aorta. Thoracic dextroscoliosis with mild multilevel degenerative change. IMPRESSION: 1. Persistent small bilateral effusions with some increase in bibasilar atelectasis/consolidation, left greater than right. Electronically Signed   By: Lucrezia Europe M.D.   On: 04/09/2016 10:57   Dg Ribs Unilateral W/chest Right  Result Date: 04/08/2016 CLINICAL DATA:  Right lateral chest wall pain after fall. Initial encounter. EXAM: RIGHT RIBS AND CHEST - 3+ VIEW COMPARISON:  None. FINDINGS: Eleven paired ribs. Displaced right posterior and lateral eighth and ninth rib fractures. No hemothorax or pneumothorax noted. No evidence of lung contusion. Severe S shaped  scoliosis of the thoracolumbar spine. Moderate hiatal hernia. IMPRESSION: Displaced right eighth and ninth rib fractures. No hemothorax or pneumothorax. Electronically Signed   By: Monte Fantasia M.D.   On: 04/08/2016 07:56   Ct Chest W Contrast  Result Date: 04/08/2016 CLINICAL DATA:  Fall with right rib fractures.  Initial encounter. EXAM: CT CHEST, ABDOMEN, AND PELVIS WITH CONTRAST TECHNIQUE: Multidetector CT imaging of the chest, abdomen and pelvis was performed following the standard protocol during bolus administration of intravenous contrast. CONTRAST:  143mL ISOVUE-300 IOPAMIDOL (ISOVUE-300) INJECTION 61% COMPARISON:  Rib series from earlier today FINDINGS: CT CHEST FINDINGS Cardiovascular: No cardiomegaly or pericardial effusion. No evidence of great vessel injury. Mediastinum/Nodes: Moderate hiatal hernia. No hematoma or adenopathy. Lungs/Pleura: Small dependent right pleural effusion which measures 30-40 Hounsfield units. The underlying lung is mildly atelectatic. Extrapleural gas without convincing pneumothorax. Extrathoracic soft tissue emphysema. Negative left chest. Musculoskeletal: See below CT ABDOMEN PELVIS FINDINGS Hepatobiliary: No evidence of injury Pancreas: Prominence of the main pancreatic duct without lesion or injury. No evidence of injury Spleen: Negative Adrenals/Urinary Tract: No evidence of injury. No hydronephrosis. Renal cortical scarring on the left. Small cortical cysts. Stomach/Bowel: No evidence of injury. Extensive colonic diverticulosis. Vascular/Lymphatic: Atheromatous calcification wall thickening on the aorta and branch vessels. No evidence of injury. Reproductive: Expected atrophy of pelvic organs.  Vaginal ring. Musculoskeletal: 11 paired ribs. Nondisplaced lateral right seventh rib fracture. Segmental and displaced right eighth, ninth, and tenth posterior rib fractures. S shaped scoliosis with severe lumbar levocurvature and moderate thoracic dextrocurvature.  Advanced asymmetric disc narrowing and facet arthropathy, especially in the lumbar spine. No acute spinal fracture is noted. IMPRESSION: 1. Segmental, displaced right eighth, ninth, and tenth rib fractures. Nondisplaced right seventh rib fracture. 2. Small layering right hemothorax. Chest wall emphysema without pneumothorax. 3. No evidence of intra-abdominal injury. Electronically Signed   By: Monte Fantasia M.D.   On: 04/08/2016 11:38   Ct Abdomen Pelvis W Contrast  Result Date: 04/08/2016 CLINICAL DATA:  Fall with right rib fractures.  Initial encounter. EXAM: CT CHEST, ABDOMEN, AND PELVIS WITH CONTRAST TECHNIQUE: Multidetector CT imaging of the chest, abdomen and pelvis was performed following the standard protocol during bolus administration of intravenous contrast. CONTRAST:  126mL ISOVUE-300 IOPAMIDOL (ISOVUE-300) INJECTION 61% COMPARISON:  Rib series from earlier today FINDINGS: CT CHEST FINDINGS Cardiovascular: No cardiomegaly or pericardial effusion. No evidence of great vessel injury. Mediastinum/Nodes: Moderate hiatal hernia. No hematoma or adenopathy. Lungs/Pleura: Small dependent right pleural effusion which measures 30-40 Hounsfield units. The underlying lung is mildly atelectatic. Extrapleural gas without convincing pneumothorax. Extrathoracic soft tissue emphysema. Negative left chest. Musculoskeletal: See below CT ABDOMEN PELVIS FINDINGS Hepatobiliary: No evidence of injury Pancreas: Prominence of the main pancreatic duct without lesion or injury. No evidence of injury Spleen: Negative Adrenals/Urinary Tract: No evidence of injury. No hydronephrosis. Renal cortical scarring on the left. Small cortical cysts. Stomach/Bowel: No evidence of injury. Extensive colonic diverticulosis. Vascular/Lymphatic: Atheromatous calcification wall thickening on the aorta and branch vessels. No evidence of injury. Reproductive: Expected atrophy of pelvic organs.  Vaginal ring. Musculoskeletal: 11 paired ribs.  Nondisplaced lateral right seventh rib fracture. Segmental and displaced right eighth, ninth, and tenth posterior rib fractures. S shaped scoliosis with severe lumbar levocurvature and moderate thoracic dextrocurvature. Advanced asymmetric disc narrowing and facet arthropathy, especially in the lumbar spine. No acute spinal fracture is noted. IMPRESSION: 1. Segmental, displaced right eighth, ninth, and tenth rib fractures. Nondisplaced right seventh rib fracture. 2. Small layering right hemothorax. Chest wall emphysema without pneumothorax. 3. No evidence of intra-abdominal injury. Electronically Signed   By: Monte Fantasia M.D.   On: 04/08/2016 11:38    Assessment/Plan 1. Fall, initial encounter -at home -cont PT   2. Closed fracture of multiple ribs of right side, initial encounter -increase tylenol to 1g tid  from 500mg  po tid for now -use oxycodone for severe pain and around PT  3. Hemothorax on right -cont IS and O2 to keep sats over 92  4. Essential hypertension -bp controlled with cozaar, monitor  5. Mild intermittent asthma without complication -continues on singulair only  6. Slow transit constipation -stable with miralax daily and colace bid -cont same and monitor esp with use of oxycodone  7. Gastroesophageal reflux disease without esophagitis -stable with protonix bid before meals -cont same and monitor  Family/ staff Communication: discussed with rehab nurse and supervisor  Labs/tests ordered:  CXR in 4-6 wks, cbc, bmp

## 2016-04-28 ENCOUNTER — Encounter: Payer: Self-pay | Admitting: Adult Health

## 2016-04-28 ENCOUNTER — Non-Acute Institutional Stay (SKILLED_NURSING_FACILITY): Payer: Medicare Other | Admitting: Adult Health

## 2016-04-28 DIAGNOSIS — I10 Essential (primary) hypertension: Secondary | ICD-10-CM | POA: Diagnosis not present

## 2016-04-28 DIAGNOSIS — K219 Gastro-esophageal reflux disease without esophagitis: Secondary | ICD-10-CM

## 2016-04-28 DIAGNOSIS — J4520 Mild intermittent asthma, uncomplicated: Secondary | ICD-10-CM

## 2016-04-28 DIAGNOSIS — K5901 Slow transit constipation: Secondary | ICD-10-CM | POA: Diagnosis not present

## 2016-04-28 DIAGNOSIS — S2241XA Multiple fractures of ribs, right side, initial encounter for closed fracture: Secondary | ICD-10-CM | POA: Diagnosis not present

## 2016-04-28 DIAGNOSIS — K5900 Constipation, unspecified: Secondary | ICD-10-CM | POA: Insufficient documentation

## 2016-04-28 NOTE — Progress Notes (Signed)
Location:  Occupational psychologist of Service:  SNF (31)  Provider:  Cindi Carbon, ANP [place] [phone]   PCP: Geoffery Lyons, MD Patient Care Team: Burnard Bunting, MD as PCP - General (Internal Medicine)  Extended Emergency Contact Information Primary Emergency Contact: Franchot Erichsen [place] Phone: [phone] Relation: Daughter  Code Status: DNR Goals of care:  Advanced Directive information Advanced Directives 04/28/2016  Does Patient Have a Medical Advance Directive? Yes  Type of Advance Directive Out of facility DNR (pink MOST or yellow form);Living will;Healthcare Power of [employer] of [place] in Chart? Yes  Would patient like information on creating a medical advance directive? -  Pre-existing out of facility DNR order (yellow form or pink MOST form) -     Allergies  Allergen Reactions  . Penicillins Other (See Comments)    Reaction unknown Has patient had a PCN reaction causing immediate rash, facial/tongue/throat swelling, SOB or lightheadedness with hypotension: n/a Has patient had a PCN reaction causing severe rash involving mucus membranes or skin necrosis: n/a Has patient had a PCN reaction that required hospitalization: n/a Has patient had a PCN reaction occurring within the last 10 years: n/a If all of the above answers are "NO", then may proceed with Cephalosporin use.   . Pneumococcal Vaccines Other (See Comments)    Reaction unknown    Chief Complaint  Patient presents with  . Discharge Note    HPI:  82 y.o. female  PMH GERD, asthma, hypertension, asthma, cystoceleand recurrent urinary tract infections who presented to [facility] after a fall in her bathroom. Patient resides at [facility] and lives independently in an apartment. The fall was mechanical in nature, CT scan chest/abd/pelv significant for segmental, displaced rib fractures of ribs 8-10, nondisplaced  fracture of rib 7. Small right hemothorax.  She returned to rehab at [facility] for therapy and has done well. She continues to use the incentive spirometer and takes tylenol for pain.  She has not needed any oxycodone in several days.  Denies sob but does have some pain on inspiration.  Sats WNL.  She has completed therapy and is ready to return to [place].   During her stay her BP has been 159-177/89-102, the high reading can occur at night when resting in bed, even when she is not in pain.  She has not had any other associated symptoms.    Past Medical History:  Diagnosis Date  . Asthma   . COPD (chronic obstructive pulmonary disease) ([place])   . Cystocele 08/2011  . Diverticulosis   . GERD (gastroesophageal reflux disease)   . Hematuria    GU workup  . Hiatal hernia   . Hypertension   . Melanoma ([place]) 11/2001  . Osteoarthritis   . Osteoporosis   . Scoliosis   . UTI (urinary tract infection)    septic    Past Surgical History:  Procedure Laterality Date  . HYSTEROSCOPY  06/1998   D&C-Benign  . MELANOMA EXCISION        reports that she has never smoked. She has never used smokeless tobacco. She reports that she does not drink alcohol or use drugs. Social History   Social History  . Marital status: Widowed    Spouse name: N/A  . Number of children: N/A  . Years of education: N/A   Occupational History  . Not on file.   Social History Main Topics  . Smoking status: Never Smoker  .  Smokeless tobacco: Never Used  . Alcohol use No     Comment: cocktail   . Drug use: No  . Sexual activity: No   Other Topics Concern  . Not on file   Social History Narrative  . No narrative on file   Functional Status Survey:    Allergies  Allergen Reactions  . Penicillins Other (See Comments)    Reaction unknown Has patient had a PCN reaction causing immediate rash, facial/tongue/throat swelling, SOB or lightheadedness with hypotension: n/a Has patient had a PCN reaction causing  severe rash involving mucus membranes or skin necrosis: n/a Has patient had a PCN reaction that required hospitalization: n/a Has patient had a PCN reaction occurring within the last 10 years: n/a If all of the above answers are "NO", then may proceed with Cephalosporin use.   . Pneumococcal Vaccines Other (See Comments)    Reaction unknown    Pertinent  Health Maintenance Due  Topic Date Due  . MAMMOGRAM  07/11/2016  . INFLUENZA VACCINE  Completed  . DEXA SCAN  Completed    Medications:   Medication List       Accurate as of 04/28/16 11:57 AM. Always use your most recent med list.          docusate sodium 100 MG capsule Commonly known as:  COLACE Take 1 capsule (100 mg total) by mouth 2 (two) times daily.   feeding supplement Liqd Take 1 Container by mouth 2 (two) times daily between meals.   losartan 50 MG tablet Commonly known as:  COZAAR Take 50 mg by mouth 2 (two) times daily.   Melatonin 5 MG Tabs Take 1 tablet by mouth at bedtime as needed.   montelukast 10 MG tablet Commonly known as:  SINGULAIR Take 10 mg by mouth daily.   multivitamin with minerals Tabs tablet Take 1 tablet by mouth daily.   ondansetron 4 MG tablet Commonly known as:  ZOFRAN Take 4 mg by mouth every 8 (eight) hours as needed for nausea.   oxyCODONE 5 MG immediate release tablet Commonly known as:  Oxy IR/ROXICODONE Take 5 mg by mouth every 6 (six) hours as needed for severe pain.   pantoprazole 40 MG tablet Commonly known as:  PROTONIX Take 40 mg by mouth 2 (two) times daily before a meal.   polyethylene glycol packet Commonly known as:  MIRALAX Take 17 g by mouth daily. Hold for diarrhea   TYLENOL 500 MG tablet Generic drug:  acetaminophen Take 1,000 mg by mouth 3 (three) times daily.   VITAMIN E PO Take 1 tablet by mouth daily.       Review of Systems  Constitutional: Negative for activity change, appetite change, chills, diaphoresis, fatigue, fever and unexpected  weight change.  HENT: Negative for congestion.   Respiratory: Negative for cough, shortness of breath and wheezing.   Cardiovascular: Negative for chest pain, palpitations and leg swelling.  Gastrointestinal: Negative for abdominal distention, abdominal pain, constipation and diarrhea.  Genitourinary: Negative for difficulty urinating and dysuria.  Musculoskeletal: Positive for arthralgias and gait problem (uses walker). Negative for back pain, joint swelling and myalgias.  Neurological: Negative for dizziness, tremors, seizures, syncope, facial asymmetry, speech difficulty, weakness, light-headedness, numbness and headaches.  Psychiatric/Behavioral: Negative for agitation, behavioral problems and confusion.       Mild memory loss    Vitals:   04/28/16 1152  BP: (!) 160/72  Pulse: 74  Resp: 15  Temp: 97.8 F (36.6 C)  SpO2: 97%  Weight:  108 lb (49 kg)   Body mass index is 20.41 kg/m. Physical Exam  Constitutional: She is oriented to person, place, and time. No distress.  HENT:  Head: Normocephalic and atraumatic.  Cardiovascular: Normal rate and regular rhythm.   No murmur heard. Pulmonary/Chest: Effort normal and breath sounds normal. No respiratory distress.  Abdominal: Soft. Bowel sounds are normal.  Neurological: She is alert and oriented to person, place, and time.  Skin: Skin is warm and dry. She is not diaphoretic.  Ecchymoses to right flank  Psychiatric: She has a normal mood and affect.  Nursing note and vitals reviewed.   Labs reviewed: Basic Metabolic Panel:  Recent Labs  04/08/16 0758 04/09/16 0403  NA 131* 132*  K 4.4 3.5  CL 95* 99*  CO2 26 25  GLUCOSE 121* 96  BUN 17 19  CREATININE 0.75 0.93  CALCIUM 9.1 8.7*   Liver Function Tests:  Recent Labs  04/08/16 0758 04/09/16 0403  AST 47* 25  ALT 18 15  ALKPHOS 49 46  BILITOT 1.1 1.1  PROT 6.9 6.1*  ALBUMIN 4.4 3.8   No results for input(s): LIPASE, AMYLASE in the last 8760 hours. No  results for input(s): AMMONIA in the last 8760 hours. CBC:  Recent Labs  04/08/16 0758 04/09/16 0403  WBC 16.6* 8.6  NEUTROABS 14.8*  --   HGB 13.1 11.7*  HCT 37.2 34.9*  MCV 92.5 93.6  PLT 297 257   Cardiac Enzymes: No results for input(s): CKTOTAL, CKMB, CKMBINDEX, TROPONINI in the last 8760 hours. BNP: Invalid input(s): POCBNP CBG: No results for input(s): GLUCAP in the last 8760 hours.  Procedures and Imaging Studies During Stay: Dg Chest 2 View  Result Date: 04/09/2016 CLINICAL DATA:  Multiple closed fractures of ribs of right side; hemothorax on right. Hx of asthma, COPD, HTN. EXAM: CHEST - 2 VIEW COMPARISON:  04/08/2016 FINDINGS: Multiple displaced right rib fractures as before. No pneumothorax. Bilateral small pleural effusions, . Adjacent atelectasis/consolidation in the lower lobes left greater than right, slightly increased since previous. Heart size upper limits normal for technique. Tortuous thoracic aorta. Thoracic dextroscoliosis with mild multilevel degenerative change. IMPRESSION: 1. Persistent small bilateral effusions with some increase in bibasilar atelectasis/consolidation, left greater than right. Electronically Signed   By: Lucrezia Europe M.D.   On: 04/09/2016 10:57   Dg Ribs Unilateral W/chest Right  Result Date: 04/08/2016 CLINICAL DATA:  Right lateral chest wall pain after fall. Initial encounter. EXAM: RIGHT RIBS AND CHEST - 3+ VIEW COMPARISON:  None. FINDINGS: Eleven paired ribs. Displaced right posterior and lateral eighth and ninth rib fractures. No hemothorax or pneumothorax noted. No evidence of lung contusion. Severe S shaped scoliosis of the thoracolumbar spine. Moderate hiatal hernia. IMPRESSION: Displaced right eighth and ninth rib fractures. No hemothorax or pneumothorax. Electronically Signed   By: Monte Fantasia M.D.   On: 04/08/2016 07:56   Ct Chest W Contrast  Result Date: 04/08/2016 CLINICAL DATA:  Fall with right rib fractures.  Initial  encounter. EXAM: CT CHEST, ABDOMEN, AND PELVIS WITH CONTRAST TECHNIQUE: Multidetector CT imaging of the chest, abdomen and pelvis was performed following the standard protocol during bolus administration of intravenous contrast. CONTRAST:  136mL ISOVUE-300 IOPAMIDOL (ISOVUE-300) INJECTION 61% COMPARISON:  Rib series from earlier today FINDINGS: CT CHEST FINDINGS Cardiovascular: No cardiomegaly or pericardial effusion. No evidence of great vessel injury. Mediastinum/Nodes: Moderate hiatal hernia. No hematoma or adenopathy. Lungs/Pleura: Small dependent right pleural effusion which measures 30-40 Hounsfield units. The underlying lung is mildly  atelectatic. Extrapleural gas without convincing pneumothorax. Extrathoracic soft tissue emphysema. Negative left chest. Musculoskeletal: See below CT ABDOMEN PELVIS FINDINGS Hepatobiliary: No evidence of injury Pancreas: Prominence of the main pancreatic duct without lesion or injury. No evidence of injury Spleen: Negative Adrenals/Urinary Tract: No evidence of injury. No hydronephrosis. Renal cortical scarring on the left. Small cortical cysts. Stomach/Bowel: No evidence of injury. Extensive colonic diverticulosis. Vascular/Lymphatic: Atheromatous calcification wall thickening on the aorta and branch vessels. No evidence of injury. Reproductive: Expected atrophy of pelvic organs.  Vaginal ring. Musculoskeletal: 11 paired ribs. Nondisplaced lateral right seventh rib fracture. Segmental and displaced right eighth, ninth, and tenth posterior rib fractures. S shaped scoliosis with severe lumbar levocurvature and moderate thoracic dextrocurvature. Advanced asymmetric disc narrowing and facet arthropathy, especially in the lumbar spine. No acute spinal fracture is noted. IMPRESSION: 1. Segmental, displaced right eighth, ninth, and tenth rib fractures. Nondisplaced right seventh rib fracture. 2. Small layering right hemothorax. Chest wall emphysema without pneumothorax. 3. No  evidence of intra-abdominal injury. Electronically Signed   By: Monte Fantasia M.D.   On: 04/08/2016 11:38   Ct Abdomen Pelvis W Contrast  Result Date: 04/08/2016 CLINICAL DATA:  Fall with right rib fractures.  Initial encounter. EXAM: CT CHEST, ABDOMEN, AND PELVIS WITH CONTRAST TECHNIQUE: Multidetector CT imaging of the chest, abdomen and pelvis was performed following the standard protocol during bolus administration of intravenous contrast. CONTRAST:  143mL ISOVUE-300 IOPAMIDOL (ISOVUE-300) INJECTION 61% COMPARISON:  Rib series from earlier today FINDINGS: CT CHEST FINDINGS Cardiovascular: No cardiomegaly or pericardial effusion. No evidence of great vessel injury. Mediastinum/Nodes: Moderate hiatal hernia. No hematoma or adenopathy. Lungs/Pleura: Small dependent right pleural effusion which measures 30-40 Hounsfield units. The underlying lung is mildly atelectatic. Extrapleural gas without convincing pneumothorax. Extrathoracic soft tissue emphysema. Negative left chest. Musculoskeletal: See below CT ABDOMEN PELVIS FINDINGS Hepatobiliary: No evidence of injury Pancreas: Prominence of the main pancreatic duct without lesion or injury. No evidence of injury Spleen: Negative Adrenals/Urinary Tract: No evidence of injury. No hydronephrosis. Renal cortical scarring on the left. Small cortical cysts. Stomach/Bowel: No evidence of injury. Extensive colonic diverticulosis. Vascular/Lymphatic: Atheromatous calcification wall thickening on the aorta and branch vessels. No evidence of injury. Reproductive: Expected atrophy of pelvic organs.  Vaginal ring. Musculoskeletal: 11 paired ribs. Nondisplaced lateral right seventh rib fracture. Segmental and displaced right eighth, ninth, and tenth posterior rib fractures. S shaped scoliosis with severe lumbar levocurvature and moderate thoracic dextrocurvature. Advanced asymmetric disc narrowing and facet arthropathy, especially in the lumbar spine. No acute spinal fracture  is noted. IMPRESSION: 1. Segmental, displaced right eighth, ninth, and tenth rib fractures. Nondisplaced right seventh rib fracture. 2. Small layering right hemothorax. Chest wall emphysema without pneumothorax. 3. No evidence of intra-abdominal injury. Electronically Signed   By: Monte Fantasia M.D.   On: 04/08/2016 11:38    Assessment/Plan:   1. Closed fracture of multiple ribs of right side, initial encounter Improved pain, doing well with incentive Continue Tylenol 1 gram TID D/C oxycodone Needs f/u xray in 2 weeks  2. Essential hypertension Uncontrolled Continue cozaar 50 mg BID Add HCTZ 12.5 mg qd to be adjusted by PCP Encourage to drink water and report dizziness, weakness, cp, sob, etc  3. Mild intermittent asthma without complication Denies symptoms Continue singulair 10 mg qd  4. Slow transit constipation Controlled Colace 100 mg p.o. BID  5. Gastroesophageal reflux disease without esophagitis Controlled Continue protonix 40 mg BID    Patient is being discharged with the following home health  services:  NA  Patient is being discharged with the following durable medical equipment:  NA  Patient has been advised to f/u with their PCP in 1-2 weeks to for a transitions of care visit. Family will make apt for f/u xray and f/u regarding elevated BP. Resident was provided meds from the med cart, and so no scripts were given except for HCTZ 12.5 mg qd #30  Future labs/tests needed:  BMP CXR

## 2016-04-29 NOTE — Progress Notes (Signed)
This encounter was created in error - please disregard.

## 2016-05-12 IMAGING — CT CT ABD-PELV W/ CM
2 of 5 series · 16 of 46 positions shown, 18 images · IV contrast (Omni 300)
Comparison: Abdominal ultrasound April 27, 2012

CLINICAL DATA: Intermittent nausea for 10 days, worse with eating.
Dehydration.

EXAM:
CT ABDOMEN AND PELVIS WITH CONTRAST
TECHNIQUE: Multidetector CT imaging of the abdomen and pelvis was performed
using the standard protocol following bolus administration of
intravenous contrast.
CONTRAST:  80mL OMNIPAQUE IOHEXOL 300 MG/ML  SOLN

[Series 2: abd/ pelvis 5.0 i30f 1 · axial · 0.78mm/px · z∈[+890,+1245]mm · 13 of 81 slices shown, 15 images]
[im 5/81  soft-tissue]
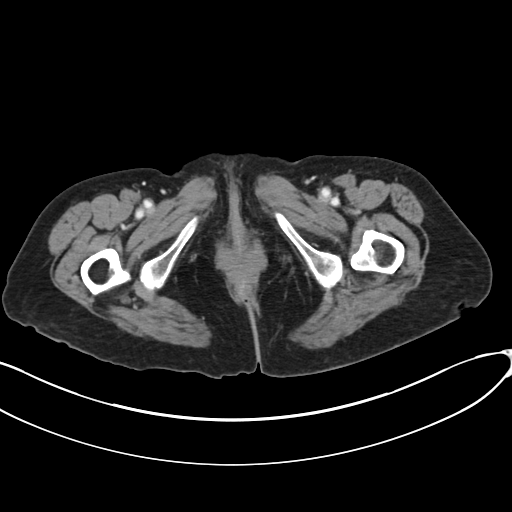
[im 5/81  bone]
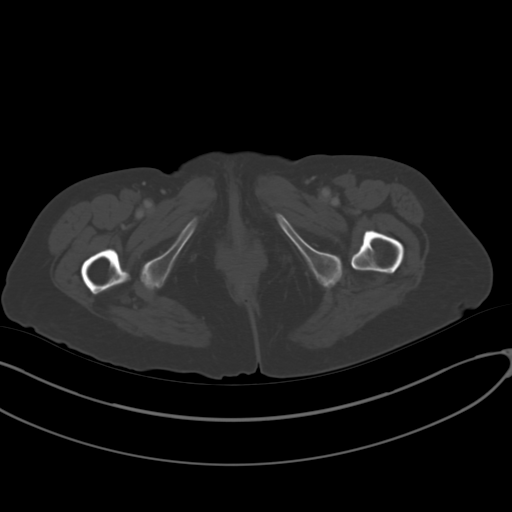
[im 9/81  soft-tissue]
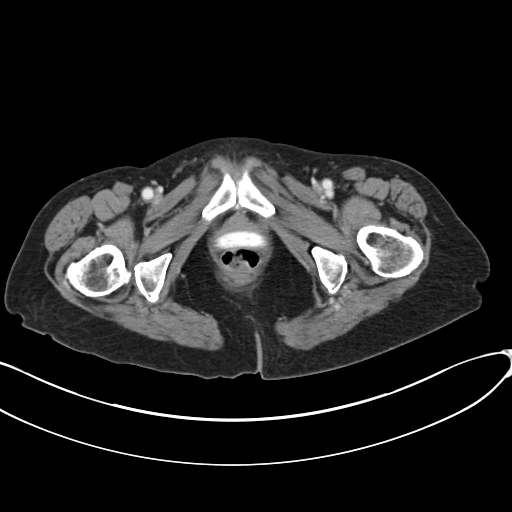
[im 18/81  soft-tissue]
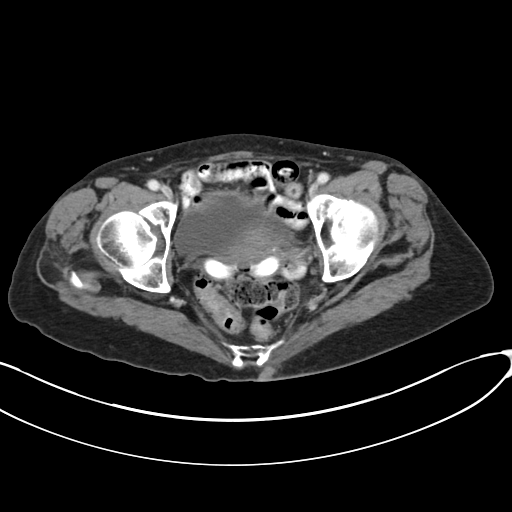
[im 23/81  soft-tissue]
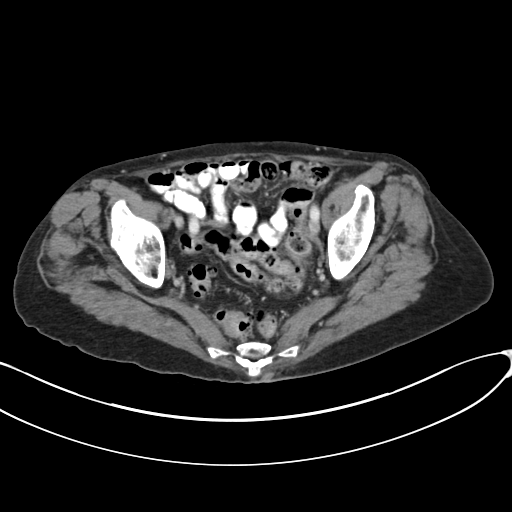
[im 27/81  soft-tissue]
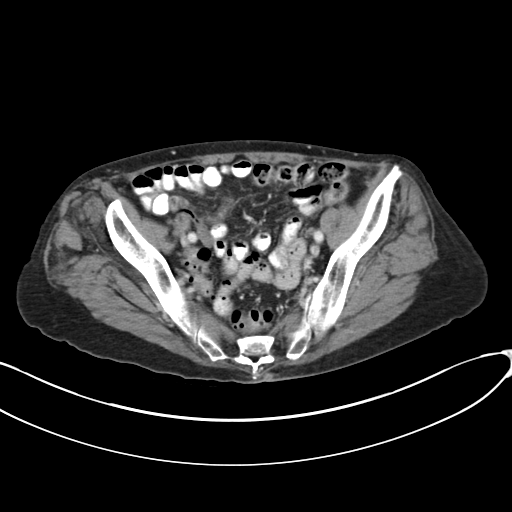
[im 36/81  soft-tissue]
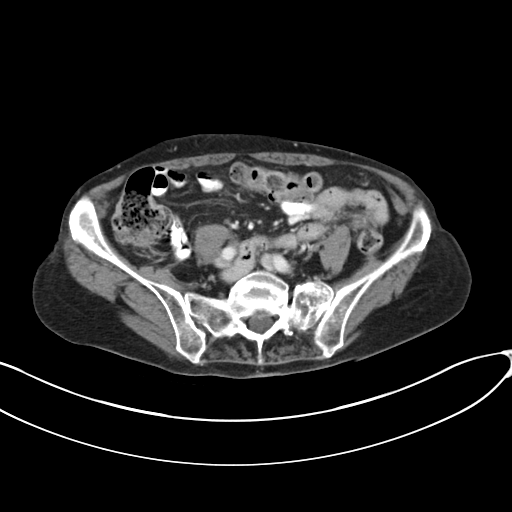
[im 41/81  soft-tissue]
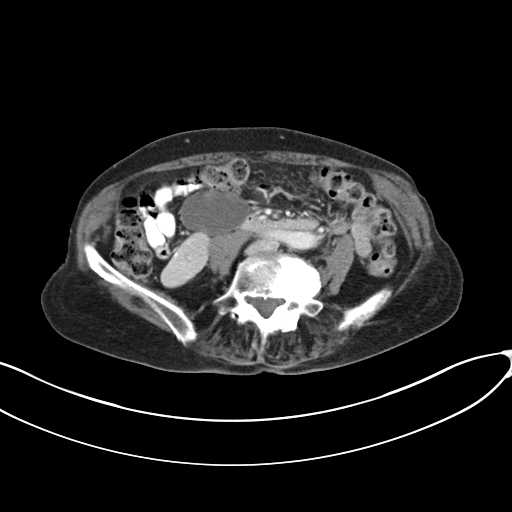
[im 45/81  soft-tissue]
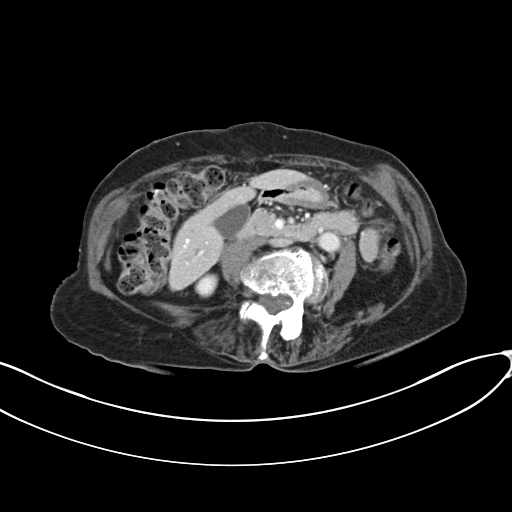
[im 54/81  soft-tissue]
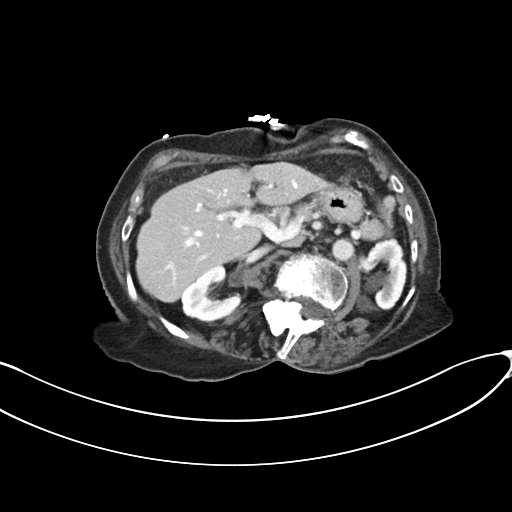
[im 54/81  bone]
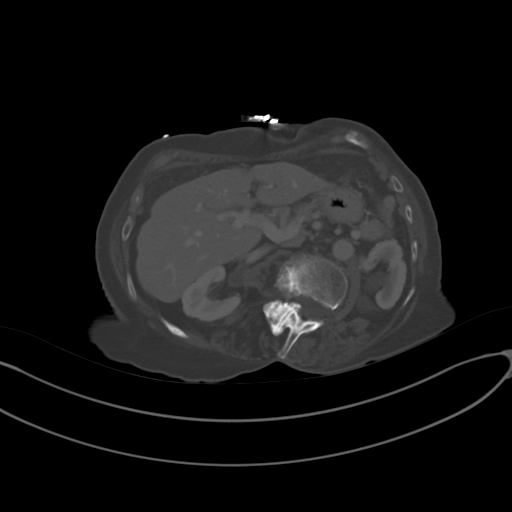
[im 58/81  soft-tissue]
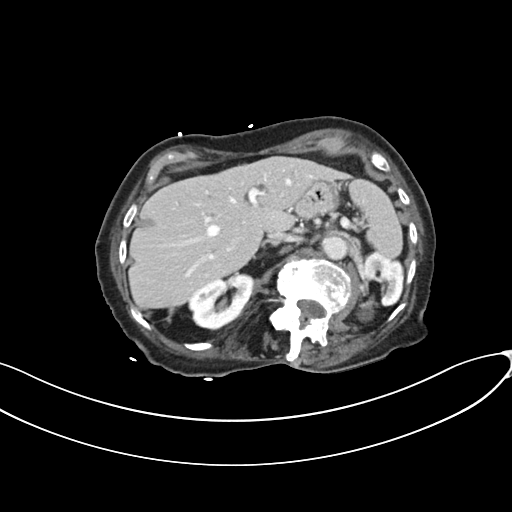
[im 63/81  soft-tissue]
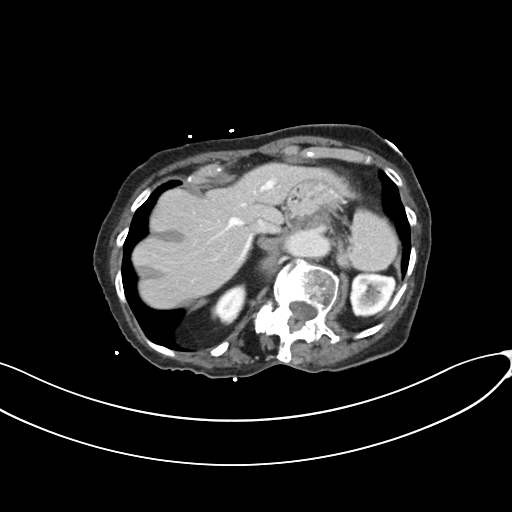
[im 72/81  soft-tissue]
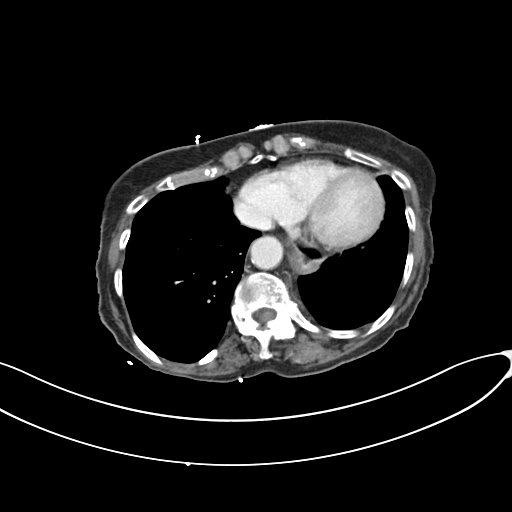
[im 76/81  soft-tissue]
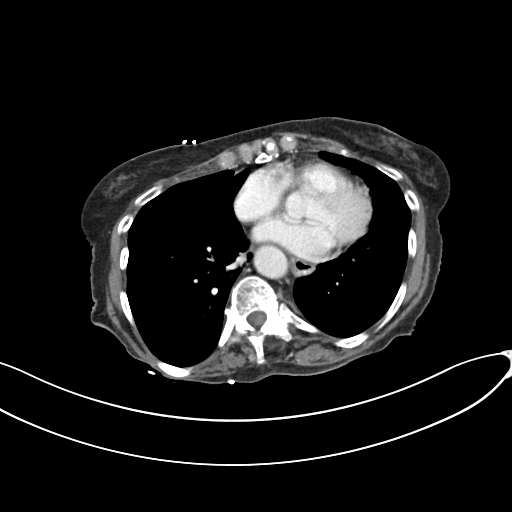

[Series 5: coronals · coronal · 0.69mm/px · 3 of 103 slices shown]
[im 35/103  soft-tissue]
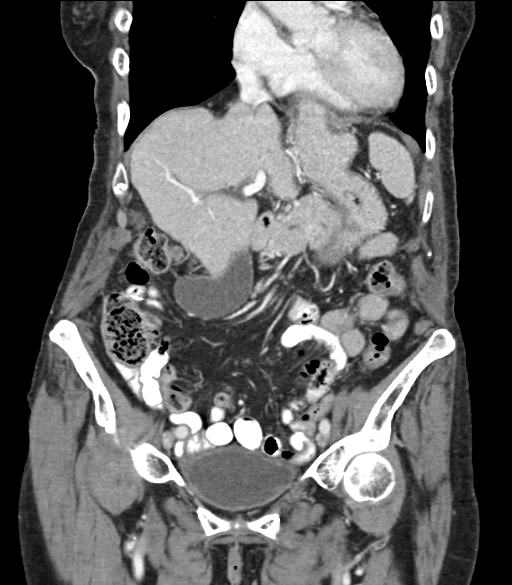
[im 46/103  soft-tissue]
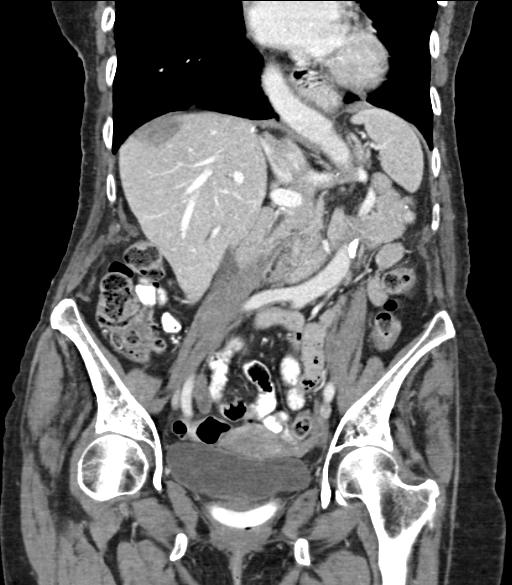
[im 57/103  soft-tissue]
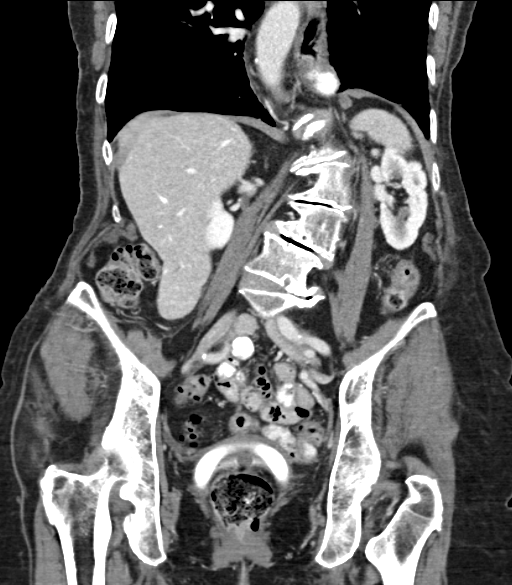

[16 of 46 positions shown; findings below may reference images not displayed]

FINDINGS: LUNG BASES: Included view of the lung bases are clear. Visualized
heart and pericardium are unremarkable.

SOLID ORGANS: The liver demonstrates focal fatty infiltration about
the falciform ligament, mild intrahepatic biliary dilatation LEFT
lobe. Gallbladder is mildly distended without cholelithiasis or CT
findings of acute cholecystitis. Spleen, pancreas and adrenal glands
are unremarkable.

GASTROINTESTINAL TRACT: Moderate hiatal hernia. Mild gastric antral
wall thickening. The small and large bowel are normal in course and
caliber without inflammatory changes. Normal appendix.

KIDNEYS/ URINARY TRACT: Kidneys are orthotopic though LEFT kidney is
mildly malrotated and a trophic, demonstrating symmetric
enhancement. 1 cm cyst upper pole with the RIGHT kidney. Too small
to characterize hypodensities in the kidneys bilaterally. No
nephrolithiasis, hydronephrosis or solid renal masses. The
unopacified ureters are normal in course and caliber. Delayed
imaging through the kidneys demonstrates symmetric prompt contrast
excretion within the proximal urinary collecting system. Urinary
bladder is partially distended and unremarkable.

PERITONEUM/RETROPERITONEUM: Aortoiliac vessels are normal in course
and caliber, moderate calcific atherosclerosis. No lymphadenopathy
by CT size criteria. Internal reproductive organs are normal,
pessary in place. No intraperitoneal free fluid nor free air.

SOFT TISSUE/OSSEOUS STRUCTURES: Non-suspicious. Severe thoracolumbar
levoscoliosis and degenerative spine.
IMPRESSION: Gastric antral wall thickening can be seen with gastritis. Moderate
hiatal hernia.

Distended gallbladder, without CT findings of acute cholecystitis.
Minimal intrahepatic biliary dilatation.

Malrotated mildly atrophic LEFT kidney without obstructive uropathy.

By: Joeson Labang

## 2016-07-01 ENCOUNTER — Ambulatory Visit: Payer: Medicare Other | Admitting: Certified Nurse Midwife

## 2016-07-03 DIAGNOSIS — R269 Unspecified abnormalities of gait and mobility: Secondary | ICD-10-CM | POA: Insufficient documentation

## 2016-07-03 DIAGNOSIS — Z8781 Personal history of (healed) traumatic fracture: Secondary | ICD-10-CM | POA: Insufficient documentation

## 2016-07-10 ENCOUNTER — Encounter: Payer: Self-pay | Admitting: Certified Nurse Midwife

## 2016-07-10 ENCOUNTER — Ambulatory Visit (INDEPENDENT_AMBULATORY_CARE_PROVIDER_SITE_OTHER): Payer: Medicare Other | Admitting: Certified Nurse Midwife

## 2016-07-10 VITALS — BP 120/76 | HR 64 | Resp 16 | Ht 62.0 in | Wt 110.0 lb

## 2016-07-10 DIAGNOSIS — Z4689 Encounter for fitting and adjustment of other specified devices: Secondary | ICD-10-CM | POA: Diagnosis not present

## 2016-07-10 DIAGNOSIS — N814 Uterovaginal prolapse, unspecified: Secondary | ICD-10-CM | POA: Diagnosis not present

## 2016-07-10 NOTE — Progress Notes (Signed)
Subjective:   83 y.o. WidowedWhite female G3P3 here for pessary check.  Patient has been using following pessary style and size: # 4 ring pessary with support.  She describes the following issues with the pessary:  None. Managing coconut oil for dryness well. Took a fall off of toilet ? Dizziness. Had fractured ribs healing well. Happy with apartment living.   Use of protective clothing such as Depends or pads No.. Problems with protective clothing with rash No..  Constipation issues with use of pessary No..  She is not sexually active.     ROS:   no vaginal bleeding, no discharge or pelvic pain, no dysuria, trouble voiding or hematuria  Compliant to use of vaginal cream Yes.   General Exam:    BP 120/76   Pulse 64   Resp 16   Ht 5\' 2"  (1.575 m)   Wt 110 lb (49.9 kg)   LMP 05/26/1997   BMI 20.12 kg/m   General appearance: alert   Pelvic: External genitalia:  no lesions   Before pessary was removed No. prolapse over the pessary   In correct position Yes.                Urethra: normal appearing urethra with no masses, tenderness or lesions              Vagina: normal appearing vagina with normal color and discharge, no lesions.  There are No abrasions or ulcerations.               Cervix: normal appearance Cervical lesions were not found   Bimanual Exam:  Uterus:  normal size, contour, position, consistency, mobility, non-tender and prolapsed second degree                                Adnexa:    normal adnexa in size, nontender and no masses and no masses                             Pessary was removed without difficulty without using forceps.  Pessary was cleansed with Betadine.  Pessary was replaced. Patient tolerated procedure well.    Assessment :  Normal pelvic exam      Cystocele symptomatic with good response with pessary use   Use of pessary continued   Plan:    Return to office in 3 months for recheck.         warning signs given for pessary use.  An After Visit  Summary was printed and given to the patient.

## 2016-07-15 NOTE — Progress Notes (Signed)
Encounter reviewed Mcdonald Reiling, MD   

## 2016-07-16 ENCOUNTER — Ambulatory Visit: Payer: Medicare Other | Attending: Internal Medicine | Admitting: Physical Therapy

## 2016-07-16 ENCOUNTER — Encounter: Payer: Self-pay | Admitting: Physical Therapy

## 2016-07-16 DIAGNOSIS — R262 Difficulty in walking, not elsewhere classified: Secondary | ICD-10-CM | POA: Insufficient documentation

## 2016-07-16 DIAGNOSIS — R293 Abnormal posture: Secondary | ICD-10-CM | POA: Diagnosis not present

## 2016-07-16 NOTE — Therapy (Signed)
[facility] [address] [place], [place], [address] Phone: [phone]   Fax:  [phone]  Physical Therapy Evaluation  Patient Details  Name: Michele [name] MRN: [id number] Date of Birth: [birth year] Referring Provider: Burnard Bunting, MD  Encounter Date: 07/16/2016      PT End of Session - 07/16/16 1222    Visit Number 1   Number of Visits 10   Date for PT Re-Evaluation 09/10/16   Authorization Type Gcodes needed; KX at 15th visit   PT Start Time 1016   PT Stop Time 1100   PT Time Calculation (min) 44 min   Activity Tolerance Patient tolerated treatment well   Behavior During Therapy [facility] for tasks assessed/performed      Past Medical History:  Diagnosis Date  . Asthma   . COPD (chronic obstructive pulmonary disease) ([place])   . Cystocele 08/2011  . Diverticulosis   . Fractured rib 2017  . GERD (gastroesophageal reflux disease)   . Hematuria    GU workup  . Hiatal hernia   . Hypertension   . Melanoma ([place]) 11/2001  . Osteoarthritis   . Osteoporosis   . Scoliosis   . UTI (urinary tract infection)    septic    Past Surgical History:  Procedure Laterality Date  . HYSTEROSCOPY  06/1998   D&C-Benign  . MELANOMA EXCISION      There were no vitals filed for this visit.       Subjective Assessment - 07/16/16 1017    Subjective Back pain has been recently coming on stronger.  Fx ribs on right by falling on the side of the tub.  Pain is now in the left.  States she lost her balance around 4 years ago after getting an infection.     Pertinent History osteoporosis, history of fall, history of rib fracture   Limitations Walking;Standing   How long can you sit comfortably? unlimited   How long can you stand comfortably? 5 minutes   How long can you walk comfortably? 5 minutes   Patient Stated Goals be able to learn what I can do to make things better, want to be as active as I can, I refuse to use a  walker or cane   Currently in Pain? Yes   Pain Score 7    Pain Location Back   Pain Orientation Left;Mid   Pain Descriptors / Indicators Aching   Pain Type Chronic pain   Pain Onset More than a month ago   Pain Frequency Intermittent   Aggravating Factors  walking and standing   Pain Relieving Factors 2 extra strength tylenol 2-3x/day, lying down   Effect of Pain on Daily Activities can't walk as much as she was   Multiple Pain Sites No            [facility] PT Assessment - 07/16/16 0001      Assessment   Medical Diagnosis R26.9 gait disturbance   Referring Provider Burnard Bunting, MD   Onset Date/Surgical Date 04/15/16  fall in NOV, decreased balance 2 years   Prior Therapy no     Precautions   Precautions None     Restrictions   Weight Bearing Restrictions No     Balance Screen   Has the patient fallen in the past 6 months Yes   How many times? 1   Has the patient had a decrease in activity level because of a fear of falling?  No   Is  the patient reluctant to leave their home because of a fear of falling?  No     Home Environment   Living Environment Private residence   Living Arrangements Alone   Type of [place]     Prior Function   Level of [place] Retired   Leisure walking     Cognition   Overall Cognitive Status Within Functional Limits for tasks assessed     Posture/Postural Control   Posture/Postural Control Postural limitations   Postural Limitations Rounded Shoulders;Forward head;Increased thoracic kyphosis;Flexed trunk  lumbar curve right, thoracic left     AROM   Lumbar Flexion 25% limited and painful   Lumbar Extension 80% limited     Strength   Right Hip Internal Rotation 4-/5   Right Hip ABduction 4-/5   Left Hip Internal Rotation 4-/5   Left Hip ABduction 4-/5     Palpation   Palpation comment Left lower ribcage at vertebral attachement very tender, muscle spasms along spine      Ambulation/Gait   Gait Pattern Right foot flat;Left foot flat;Right flexed knee in stance;Left flexed knee in stance;Trunk flexed  holds arms behind for stability     Berg Balance Test   Sit to Stand Able to stand without using hands and stabilize independently   Standing Unsupported Able to stand safely 2 minutes   Sitting with Back Unsupported but Feet Supported on Floor or Stool Able to sit safely and securely 2 minutes   Stand to Sit Sits safely with minimal use of hands   Transfers Able to transfer safely, minor use of hands   Standing Unsupported with Eyes Closed Able to stand 10 seconds safely   Standing Ubsupported with Feet Together Able to place feet together independently and stand for 1 minute with supervision   From Standing, Reach Forward with Outstretched Arm Can reach forward >5 cm safely (2")   From Standing Position, Pick up Object from Tea to pick up shoe safely and easily   From Standing Position, Turn to Look Behind Over each Shoulder Looks behind one side only/other side shows less weight shift   Turn 360 Degrees Able to turn 360 degrees safely but slowly   Standing Unsupported, Alternately Place Feet on Step/Stool Able to complete >2 steps/needs minimal assist   Standing Unsupported, One Foot in Front Able to take small step independently and hold 30 seconds   Standing on One Leg Unable to try or needs assist to prevent fall   Total Score 41   Berg comment: demonstrates high fall risk, needs AD for community ambulation     Timed Up and Go Test   Normal TUG (seconds) 24                             PT Short Term Goals - 07/16/16 2004      PT SHORT TERM GOAL #1   Title independent with initial HEP   Time 4   Period Weeks   Status New     PT SHORT TERM GOAL #2   Title patient reports 25% reduced pain during daily activities due to improved posture   Time 4   Period Weeks   Status New     PT SHORT TERM GOAL #3   Title TUG  improved to < or = to 18 sec to demonstrate reduced risk of falls   Time 4  Period Weeks   Status New     PT SHORT TERM GOAL #4   Title Able to perform single leg stand for 3 seconds on each side for improved stability during ambulation   Time 4   Period Weeks   Status New           PT Long Term Goals - 07/16/16 2008      PT LONG TERM GOAL #1   Title independent with advanced HEP   Time 8   Period Weeks   Status New     PT LONG TERM GOAL #2   Title reports 50% less pain during functional activities   Time 8   Period Weeks   Status New     PT LONG TERM GOAL #3   Title Berg balance > or = to 45/56 to demonstrate score improved to level where she is safe to ambulate without AD.   Time 8   Period Weeks   Status New     PT LONG TERM GOAL #4   Title TUG < or equal to 15 sec to demonstrate reduced risk of falls and improved strength   Time 8   Period Weeks   Status New               Plan - 07/16/16 1241    Clinical Impression Statement Patient is moderate complexity eval due to osteoporosis, history of fall, history of rib fracture, hisotry of sepsis causing balance issue and needed assessment of multiple elements including. LE strength, balance and lumbar and thoracic region with recently progressing symptoms of pain and lack of balance.  Has pain of 6/10, bilateral hip weakness 4-/5, and increased fall risk based on 41/56 Berg score and 24 sec TUG.  Patient is also experiencing increased muscle spasms due to chronic back issues as well as recent rib fractures causing increased pain.  Patient has increased kyphosis and feels like it is very difficult to walk without holding her hands behind her back for stability.  Patient needs skilled PT for improved posture, balance and to address all impairments so she can return to a more active and healthy lifestyle and perform functional activities with more ease.   Rehab Potential Excellent   Clinical Impairments Affecting  Rehab Potential osteoporosis, history of fall, history of rib fracture, hisotry of sepsis causing balance issue   PT Frequency 2x / week   PT Duration 8 weeks   PT Treatment/Interventions ADLs/Self Care Home Management;Cryotherapy;Electrical Stimulation;Moist Heat;Ultrasound;Gait training;Stair training;Functional mobility training;Therapeutic activities;Therapeutic exercise;Balance training;Neuromuscular re-education;Patient/family education;Manual techniques;Dry needling   PT Next Visit Plan manual for muscle spasms, balance and LE strengthening, posture education   Recommended Other Services none   Consulted and Agree with Plan of Care Patient      Patient will benefit from skilled therapeutic intervention in order to improve the following deficits and impairments:  Abnormal gait, Decreased activity tolerance, Decreased balance, Decreased endurance, Decreased range of motion, Difficulty walking, Decreased strength, Pain, Increased muscle spasms, Postural dysfunction  Visit Diagnosis: Abnormal posture - Plan: PT plan of care cert/re-cert  Difficulty in walking, not elsewhere classified - Plan: PT plan of care cert/re-cert      G-Codes - [id number]    Functional Assessment Tool Used (Outpatient Only) Berg, TUG, clinical reasoning   Functional Limitation Mobility: Walking and moving around   Mobility: Walking and Moving Around Current Status [id number]) At least 60 percent but less than 80 percent impaired, limited or restricted   Mobility: Walking  and Moving Around Goal Status [phone]) At least 40 percent but less than 60 percent impaired, limited or restricted       Problem List Patient Active Problem List   Diagnosis Date Noted  . Constipation 04/28/2016  . GERD (gastroesophageal reflux disease) 04/28/2016  . Multiple closed fractures of ribs of right side   . Hemothorax on right   . Leukocytosis   . Ribs, multiple fractures 04/08/2016  . Cystocele 08/18/2012  . Sepsis  secondary to UTI ([place]) 04/27/2012  . E coli bacteremia 04/27/2012  . Elevation of cardiac enzymes 04/27/2012  . Essential hypertension 04/27/2012  . Asthma 04/27/2012  . Nausea 04/27/2012    Zannie Cove, PT 07/16/2016, 8:15 PM  [facility] [address], [place], [place], [address] Phone: [phone]   Fax:  [phone]  Name: Michele [name] MRN: [id number] Date of Birth: 02-28-1927

## 2016-07-24 ENCOUNTER — Encounter: Payer: Self-pay | Admitting: Physical Therapy

## 2016-07-24 ENCOUNTER — Ambulatory Visit: Payer: Medicare Other | Attending: Internal Medicine | Admitting: Physical Therapy

## 2016-07-24 DIAGNOSIS — R262 Difficulty in walking, not elsewhere classified: Secondary | ICD-10-CM | POA: Insufficient documentation

## 2016-07-24 DIAGNOSIS — R293 Abnormal posture: Secondary | ICD-10-CM | POA: Insufficient documentation

## 2016-07-24 NOTE — Patient Instructions (Addendum)
ABDUCTION: Standing - Resistance Band (Active)   Stand, feet flat. Against yellow resistance band, lift right leg out to side. Complete ___ sets of ___ repetitions. Perform ___ sessions per day.  ADDUCTION: Standing - Stable: Resistance Band (Active)   Stand, right leg out to side as far as possible. Against yellow resistance band, draw leg in across midline. Complete ___ sets of ___ repetitions. Perform ___ sessions per day.  Strengthening: Hip Flexion - Resisted   With tubing around left ankle, anchor behind, bring leg forward, keeping knee straight. Repeat ____ times per set. Do ____ sets per session. Do ____ sessions per day.  Strengthening: Hip Extension - Resisted   With tubing around right ankle, face anchor and pull leg straight back. Repeat ____ times per set. Do ____ sets per session. Do ____ sessions per day.    [name], PTA 07/24/16 12:19 PM  [facility] [address], [place], [place] [address] Phone # [phone] Fax [phone]

## 2016-07-24 NOTE — Therapy (Signed)
[facility] [address] [place], [place], [address] Phone: [phone]   Fax:  [phone]  Physical Therapy Treatment  Patient Details  Name: Michele [name] MRN: [id number] Date of Birth: [birth year] Referring Provider: Burnard Bunting, MD  Encounter Date: 07/24/2016      PT End of Session - 07/24/16 1201    Visit Number 2   Number of Visits 10   Date for PT Re-Evaluation 09/10/16   Authorization Type Gcodes needed; KX at 15th visit   PT Start Time 1147   PT Stop Time 1231   PT Time Calculation (min) 44 min   Activity Tolerance Patient tolerated treatment well   Behavior During Therapy [facility] for tasks assessed/performed      Past Medical History:  Diagnosis Date  . Asthma   . COPD (chronic obstructive pulmonary disease) ([place])   . Cystocele 08/2011  . Diverticulosis   . Fractured rib 2017  . GERD (gastroesophageal reflux disease)   . Hematuria    GU workup  . Hiatal hernia   . Hypertension   . Melanoma ([place]) 11/2001  . Osteoarthritis   . Osteoporosis   . Scoliosis   . UTI (urinary tract infection)    septic    Past Surgical History:  Procedure Laterality Date  . HYSTEROSCOPY  06/1998   D&C-Benign  . MELANOMA EXCISION      There were no vitals filed for this visit.      Subjective Assessment - 07/24/16 1149    Pertinent History osteoporosis, history of fall, history of rib fracture   Limitations Walking;Standing   How long can you sit comfortably? unlimited   How long can you stand comfortably? 5 minutes   How long can you walk comfortably? 5 minutes   Patient Stated Goals be able to learn what I can do to make things better, want to be as active as I can, I refuse to use a walker or cane   Currently in Pain? Yes   Pain Score 7    Pain Location Back   Pain Orientation Left;Mid   Pain Descriptors / Indicators Aching   Pain Type Chronic pain   Pain Onset More than a month ago   Pain  Frequency Intermittent   Aggravating Factors  walking and standing   Pain Relieving Factors 2 extra strength tylenol 2-3x/ day, lying down   Effect of Pain on Daily Activities can't walk as much as she wants   Multiple Pain Sites No                         [facility] Adult PT Treatment/Exercise - 07/24/16 0001      Neuro Re-ed    Neuro Re-ed Details  Narrow stance, tandem stance, sincle leg stance balance for 30 seconds  Ball toass no hand hold     Lumbar Exercises: Seated   Other Seated Lumbar Exercises Row and shoulder extension  VC for posture     Knee/Hip Exercises: Standing   Forward Step Up 2 sets;10 reps;Step Height: 6"   Rebounder Weight shifting 3 ways  Single leg stance 30 seconds x2   Other Standing Knee Exercises 4 way hip directions   Other Standing Knee Exercises --                PT Education - 07/24/16 1228    Education provided Yes   Education Details Strength and balance   Person(s) Educated Patient  Methods Explanation;Demonstration;Handout   Comprehension Verbalized understanding          PT Short Term Goals - 07/16/16 2004      PT SHORT TERM GOAL #1   Title independent with initial HEP   Time 4   Period Weeks   Status New     PT SHORT TERM GOAL #2   Title patient reports 25% reduced pain during daily activities due to improved posture   Time 4   Period Weeks   Status New     PT SHORT TERM GOAL #3   Title TUG improved to < or = to 18 sec to demonstrate reduced risk of falls   Time 4   Period Weeks   Status New     PT SHORT TERM GOAL #4   Title Able to perform single leg stand for 3 seconds on each side for improved stability during ambulation   Time 4   Period Weeks   Status New           PT Long Term Goals - 07/16/16 2008      PT LONG TERM GOAL #1   Title independent with advanced HEP   Time 8   Period Weeks   Status New     PT LONG TERM GOAL #2   Title reports 50% less pain during functional  activities   Time 8   Period Weeks   Status New     PT LONG TERM GOAL #3   Title Berg balance > or = to 45/56 to demonstrate score improved to level where she is safe to ambulate without AD.   Time 8   Period Weeks   Status New     PT LONG TERM GOAL #4   Title TUG < or equal to 15 sec to demonstrate reduced risk of falls and improved strength   Time 8   Period Weeks   Status New               Plan - 07/24/16 1230    Clinical Impression Statement Pt presents with poor posture, decrease balance, and decreased strength. Pt has had physical therapy in the past and does not want to repoeat exercsies she has dones previously. Pt was adament wanting to increase difficulty in exercises wanting to focus on balance but was hesistant to perform any balance exercises. Pt has difficulty understanding the importance of strengthening exercises and the relationship between strength and porper posture and good balance. Pt will continue to benefit from skilled thearpy for LE strength and balance progression.    Rehab Potential Excellent   Clinical Impairments Affecting Rehab Potential osteoporosis, history of fall, history of rib fracture, hisotry of sepsis causing balance issue   PT Frequency 2x / week   PT Duration 8 weeks   PT Treatment/Interventions ADLs/Self Care Home Management;Cryotherapy;Electrical Stimulation;Moist Heat;Ultrasound;Gait training;Stair training;Functional mobility training;Therapeutic activities;Therapeutic exercise;Balance training;Neuromuscular re-education;Patient/family education;Manual techniques;Dry needling   PT Next Visit Plan Walking with sports cord,    Consulted and Agree with Plan of Care Patient      Patient will benefit from skilled therapeutic intervention in order to improve the following deficits and impairments:  Abnormal gait, Decreased activity tolerance, Decreased balance, Decreased endurance, Decreased range of motion, Difficulty walking, Decreased  strength, Pain, Increased muscle spasms, Postural dysfunction  Visit Diagnosis: Abnormal posture  Difficulty in walking, not elsewhere classified     Problem List Patient Active Problem List   Diagnosis Date Noted  . Constipation 04/28/2016  .  GERD (gastroesophageal reflux disease) 04/28/2016  . Multiple closed fractures of ribs of right side   . Hemothorax on right   . Leukocytosis   . Ribs, multiple fractures 04/08/2016  . Cystocele 08/18/2012  . Sepsis secondary to UTI ([place]) 04/27/2012  . E coli bacteremia 04/27/2012  . Elevation of cardiac enzymes 04/27/2012  . Essential hypertension 04/27/2012  . Asthma 04/27/2012  . Nausea 04/27/2012    Michele [name] PTA 07/24/2016, 1:25 PM  [facility] [address] [place], [place], [address] Phone: [phone]   Fax:  [phone]  Name: Michele [name] MRN: [id number] Date of Birth: 1927-03-24

## 2016-07-29 ENCOUNTER — Encounter: Payer: Self-pay | Admitting: Physical Therapy

## 2016-07-29 ENCOUNTER — Ambulatory Visit: Payer: Medicare Other | Admitting: Physical Therapy

## 2016-07-29 DIAGNOSIS — R293 Abnormal posture: Secondary | ICD-10-CM

## 2016-07-29 DIAGNOSIS — R262 Difficulty in walking, not elsewhere classified: Secondary | ICD-10-CM

## 2016-07-29 NOTE — Patient Instructions (Signed)
Total 4    RHOMBERG STANCE  Stand tall and with feet together next to a table or other sturdy object. With feet together and arms crossed over your chest hold and balance in this position. Try and hold this position as best you can.  If you lose your balance, you can use one or more strategies to help: Take a step Unfold your arms and raise them to the sides Grab on to something for support  Duration 15 Seconds    Complete 3 Sets    Perform 3 Time(s) a Week    Watch Video     Enlarge     SINGLE LEG STANCE - SLS  Stand on one leg and maintain your balance.  Repeat 3 Times    Hold 10 Seconds    Complete 1 Set    Perform 3 Time(s) a Week    Enlarge     TANDEM WALK  Stand with one foot directly in front of the other so that the toes of one foot touches the heel of the other as shown in the image.   Progress by taking steps with your heel touching your toes with each step.   Hold onto a wall our counter top to help maintain your balance.  Repeat 3 Times    Hold 10 Seconds    Complete 1 Set    Perform 3 Time(s) a Week    Watch Video     Enlarge     CUPS - ALTERNATE TOE TAPS  Stand next to something for balance assist if needed.   Place a paper or plastic cup on the floor.   Stand in front of the cup and raise one foot off the floor as you balance on the other leg. Tap the top of the cup with your toes. Then, set your foot back down and perform on the other side. Use hand on table for balance if needed.   If you lose your balance, you can use one or more strategies to help: Take a step Raise arms out to the sides Grab on to something for support  Repeat 10 Times    Hold 2 Seconds    Complete 1 Set    Perform 3 Time(s) a Week   [name], PTA 07/29/16 3:51 PM  [facility] [address], [place],  [address] Phone # [phone] Fax [phone]

## 2016-07-29 NOTE — Therapy (Signed)
[facility] [address], [place], [place], [address] Phone: [phone]   Fax:  [phone]  Physical Therapy Treatment  Patient Details  Name: Michele [name] MRN: [id number] Date of Birth: 04/09/1927 Referring Provider: Burnard Bunting, MD  Encounter Date: 07/29/2016      PT End of Session - 07/29/16 1256    Visit Number 3   Number of Visits 10   Date for PT Re-Evaluation 09/10/16   Authorization Type Gcodes needed; KX at 15th visit   PT Start Time 1151   PT Stop Time 1230   PT Time Calculation (min) 39 min   Activity Tolerance Patient tolerated treatment well   Behavior During Therapy [facility] for tasks assessed/performed      Past Medical History:  Diagnosis Date  . Asthma   . COPD (chronic obstructive pulmonary disease) ([place])   . Cystocele 08/2011  . Diverticulosis   . Fractured rib 2017  . GERD (gastroesophageal reflux disease)   . Hematuria    GU workup  . Hiatal hernia   . Hypertension   . Melanoma ([place]) 11/2001  . Osteoarthritis   . Osteoporosis   . Scoliosis   . UTI (urinary tract infection)    septic    Past Surgical History:  Procedure Laterality Date  . HYSTEROSCOPY  06/1998   D&C-Benign  . MELANOMA EXCISION      There were no vitals filed for this visit.      Subjective Assessment - 07/29/16 1255    Subjective I always have back pain. It gets worse the longer I stand.    Pertinent History osteoporosis, history of fall, history of rib fracture   Limitations Walking;Standing   How long can you sit comfortably? unlimited   How long can you stand comfortably? 5 minutes   How long can you walk comfortably? 5 minutes   Patient Stated Goals be able to learn what I can do to make things better, want to be as active as I can, I refuse to use a walker or cane   Currently in Pain? Yes   Pain Score 7    Pain Location Back   Pain Orientation Left;Mid   Pain Descriptors / Indicators Aching    Pain Type Chronic pain   Pain Onset More than a month ago   Pain Frequency Intermittent   Aggravating Factors  walking and standing   Pain Relieving Factors 2 extra strength tylenol 2-3x/ day, laying down   Effect of Pain on Daily Activities can't walk as much as she wants   Multiple Pain Sites No                         [facility] Adult PT Treatment/Exercise - 07/29/16 0001      Balance   Balance Assessed Yes     Static Standing Balance   Rhomberg - Eyes Opened --  15 seconds     Dynamic Standing Balance   Turn head to look over shoulder comments: 5x each direction   Step ups comments: Toe tapping 1 HHA     Neuro Re-ed    Neuro Re-ed Details  Narrow stance, tandem stance, sincle leg stance balance for 30 seconds     Knee/Hip Exercises: Aerobic   Nustep L7 x 6 minutes  Therapist present to discuss treatment     Knee/Hip Exercises: Machines for Strengthening   Cybex Leg Press Seat 6 65# Bil 3x10  PT Education - 07/29/16 1549    Education provided Yes   Education Details Balance   Person(s) Educated Patient   Methods Explanation;Demonstration;Handout   Comprehension Verbalized understanding          PT Short Term Goals - 07/16/16 2004      PT SHORT TERM GOAL #1   Title independent with initial HEP   Time 4   Period Weeks   Status New     PT SHORT TERM GOAL #2   Title patient reports 25% reduced pain during daily activities due to improved posture   Time 4   Period Weeks   Status New     PT SHORT TERM GOAL #3   Title TUG improved to < or = to 18 sec to demonstrate reduced risk of falls   Time 4   Period Weeks   Status New     PT SHORT TERM GOAL #4   Title Able to perform single leg stand for 3 seconds on each side for improved stability during ambulation   Time 4   Period Weeks   Status New           PT Long Term Goals - 07/29/16 1547      PT LONG TERM GOAL #1   Title independent with advanced HEP   Time  8   Period Weeks   Status On-going     PT LONG TERM GOAL #2   Title reports 50% less pain during functional activities   Time 8   Period Weeks   Status On-going     PT LONG TERM GOAL #3   Title Berg balance > or = to 45/56 to demonstrate score improved to level where she is safe to ambulate without AD.   Time 8   Period Weeks   Status On-going     PT LONG TERM GOAL #4   Title TUG < or equal to 15 sec to demonstrate reduced risk of falls and improved strength   Time 8   Period Weeks   Status On-going               Plan - 07/29/16 1544    Clinical Impression Statement Pt has had physical therapy in the past and is very particular about exercises and is adament about remaining with a consistant therapist. Pt did well with standing static and dynamic balance exercises. Did well with strengthening machines. Patient has limited tolerance for standing due to back pain. Will alternate standing balance exercises with seated strengthening exercises to decreased amount of standing. Pt will continue to benefit from skilled therapy for balance and LE strength.    Rehab Potential Excellent   Clinical Impairments Affecting Rehab Potential osteoporosis, history of fall, history of rib fracture, hisotry of sepsis causing balance issue   PT Frequency 2x / week   PT Duration 8 weeks   PT Treatment/Interventions ADLs/Self Care Home Management;Cryotherapy;Electrical Stimulation;Moist Heat;Ultrasound;Gait training;Stair training;Functional mobility training;Therapeutic activities;Therapeutic exercise;Balance training;Neuromuscular re-education;Patient/family education;Manual techniques;Dry needling   PT Next Visit Plan Walking with sports cord, leg press, Nustep, standing balance exercises   Consulted and Agree with Plan of Care Patient      Patient will benefit from skilled therapeutic intervention in order to improve the following deficits and impairments:  Abnormal gait, Decreased activity  tolerance, Decreased balance, Decreased endurance, Decreased range of motion, Difficulty walking, Decreased strength, Pain, Increased muscle spasms, Postural dysfunction  Visit Diagnosis: Abnormal posture  Difficulty in walking, not elsewhere classified  Problem List Patient Active Problem List   Diagnosis Date Noted  . Constipation 04/28/2016  . GERD (gastroesophageal reflux disease) 04/28/2016  . Multiple closed fractures of ribs of right side   . Hemothorax on right   . Leukocytosis   . Ribs, multiple fractures 04/08/2016  . Cystocele 08/18/2012  . Sepsis secondary to UTI ([place]) 04/27/2012  . E coli bacteremia 04/27/2012  . Elevation of cardiac enzymes 04/27/2012  . Essential hypertension 04/27/2012  . Asthma 04/27/2012  . Nausea 04/27/2012    [name] PTA 07/29/2016, 3:52 PM  [facility] [address] [place], [place], [address] Phone: [phone]   Fax:  [phone]  Name: ANASTASI KEGG MRN: [id number] Date of Birth: April 06, 1927

## 2016-08-05 ENCOUNTER — Encounter: Payer: Medicare Other | Admitting: Physical Therapy

## 2016-08-07 ENCOUNTER — Encounter: Payer: Self-pay | Admitting: Physical Therapy

## 2016-08-07 ENCOUNTER — Ambulatory Visit: Payer: Medicare Other | Admitting: Physical Therapy

## 2016-08-07 DIAGNOSIS — R293 Abnormal posture: Secondary | ICD-10-CM | POA: Diagnosis not present

## 2016-08-07 DIAGNOSIS — R262 Difficulty in walking, not elsewhere classified: Secondary | ICD-10-CM

## 2016-08-07 NOTE — Therapy (Signed)
[facility] [address] [place], [place], [address] Phone: [phone]   Fax:  [phone]  Physical Therapy Treatment  Patient Details  Name: Michele [name] MRN: [id number] Date of Birth: April 03, 1927 Referring Provider: Burnard Bunting, MD  Encounter Date: 08/07/2016      PT End of Session - 08/07/16 1127    Visit Number 4   Number of Visits 10   Date for PT Re-Evaluation 09/10/16   Authorization Type Gcodes needed; KX at 15th visit   PT Start Time 1018   PT Stop Time 1101   PT Time Calculation (min) 43 min   Activity Tolerance Patient tolerated treatment well   Behavior During Therapy [facility] for tasks assessed/performed      Past Medical History:  Diagnosis Date  . Asthma   . COPD (chronic obstructive pulmonary disease) ([place])   . Cystocele 08/2011  . Diverticulosis   . Fractured rib 2017  . GERD (gastroesophageal reflux disease)   . Hematuria    GU workup  . Hiatal hernia   . Hypertension   . Melanoma ([place]) 11/2001  . Osteoarthritis   . Osteoporosis   . Scoliosis   . UTI (urinary tract infection)    septic    Past Surgical History:  Procedure Laterality Date  . HYSTEROSCOPY  06/1998   D&C-Benign  . MELANOMA EXCISION      There were no vitals filed for this visit.                       [facility] Adult PT Treatment/Exercise - 08/07/16 0001      Static Standing Balance   Rhomberg - Eyes Opened --  30 seconds   Rhomberg - Eyes Closed --  30 seconds     Dynamic Standing Balance   Turn head to look over shoulder comments: 5x each direction   Step ups comments: Toe tapping 1 HHA  8 inch box     Neuro Re-ed    Neuro Re-ed Details  Narrow stance, tandem stance, sincle leg stance balance for 30 seconds     Lumbar Exercises: Seated   Long Arc Quad on Chair Strengthening;Both;2 sets;10 reps;Weights   LAQ on Chair Weights (lbs) 5     Knee/Hip Exercises: Aerobic   Nustep L3 x 6  minutes  Therapist present to discuss treatment     Knee/Hip Exercises: Machines for Strengthening   Cybex Leg Press Seat 6 65# Bil 3x10     Knee/Hip Exercises: Standing   SLS Both feet on blue mat, normal, narrow, and wide BOS  1 HHA   Walking with Sports Cord --                  PT Short Term Goals - 07/16/16 2004      PT SHORT TERM GOAL #1   Title independent with initial HEP   Time 4   Period Weeks   Status New     PT SHORT TERM GOAL #2   Title patient reports 25% reduced pain during daily activities due to improved posture   Time 4   Period Weeks   Status New     PT SHORT TERM GOAL #3   Title TUG improved to < or = to 18 sec to demonstrate reduced risk of falls   Time 4   Period Weeks   Status New     PT SHORT TERM GOAL #4   Title Able to  perform single leg stand for 3 seconds on each side for improved stability during ambulation   Time 4   Period Weeks   Status New           PT Long Term Goals - 07/29/16 1547      PT LONG TERM GOAL #1   Title independent with advanced HEP   Time 8   Period Weeks   Status On-going     PT LONG TERM GOAL #2   Title reports 50% less pain during functional activities   Time 8   Period Weeks   Status On-going     PT LONG TERM GOAL #3   Title Berg balance > or = to 45/56 to demonstrate score improved to level where she is safe to ambulate without AD.   Time 8   Period Weeks   Status On-going     PT LONG TERM GOAL #4   Title TUG < or equal to 15 sec to demonstrate reduced risk of falls and improved strength   Time 8   Period Weeks   Status On-going               Plan - 08/07/16 1506    Clinical Impression Statement Pt able to tolerate all exercises well but fatigued at end of session, legs feeling shakey. Pt did well with all standing balance exercises. Stronger on Lt LE than Rt LE. Pt reports she always has more weight on Lt side than Rt due to scoliosis. Pt encouraged to try to keep even weight  distribution with standing. Pt will continue to benefit from skilled therapy for balance training and LE strengthening.    Rehab Potential Excellent   Clinical Impairments Affecting Rehab Potential osteoporosis, history of fall, history of rib fracture, hisotry of sepsis causing balance issue   PT Frequency 2x / week   PT Duration 8 weeks   PT Treatment/Interventions ADLs/Self Care Home Management;Cryotherapy;Electrical Stimulation;Moist Heat;Ultrasound;Gait training;Stair training;Functional mobility training;Therapeutic activities;Therapeutic exercise;Balance training;Neuromuscular re-education;Patient/family education;Manual techniques;Dry needling   PT Next Visit Plan Walking with sports cord at begining of treatment, leg press, bike, standing balance exercises   Consulted and Agree with Plan of Care Patient      Patient will benefit from skilled therapeutic intervention in order to improve the following deficits and impairments:  Abnormal gait, Decreased activity tolerance, Decreased balance, Decreased endurance, Decreased range of motion, Difficulty walking, Decreased strength, Pain, Increased muscle spasms, Postural dysfunction  Visit Diagnosis: Abnormal posture  Difficulty in walking, not elsewhere classified     Problem List Patient Active Problem List   Diagnosis Date Noted  . Constipation 04/28/2016  . GERD (gastroesophageal reflux disease) 04/28/2016  . Multiple closed fractures of ribs of right side   . Hemothorax on right   . Leukocytosis   . Ribs, multiple fractures 04/08/2016  . Cystocele 08/18/2012  . Sepsis secondary to UTI ([place]) 04/27/2012  . E coli bacteremia 04/27/2012  . Elevation of cardiac enzymes 04/27/2012  . Essential hypertension 04/27/2012  . Asthma 04/27/2012  . Nausea 04/27/2012    [name] PTA 08/07/2016, 3:25 PM  [facility] [address], [place], [place],  [address] Phone: [phone]   Fax:  [phone]  Name: Michele [name] MRN: [id number] Date of Birth: 08-12-1926

## 2016-08-14 ENCOUNTER — Encounter: Payer: Medicare Other | Admitting: Physical Therapy

## 2016-08-19 ENCOUNTER — Ambulatory Visit: Payer: Medicare Other | Admitting: Physical Therapy

## 2016-08-19 DIAGNOSIS — R262 Difficulty in walking, not elsewhere classified: Secondary | ICD-10-CM

## 2016-08-19 DIAGNOSIS — R293 Abnormal posture: Secondary | ICD-10-CM

## 2016-08-19 NOTE — Therapy (Addendum)
[facility] [address] [place], [place], [address] Phone: [phone]   Fax:  [phone]  Physical Therapy Treatment  Patient Details  Name: Michele [name] MRN: [id number] Date of Birth: 06/20/1926 Referring Provider: Burnard Bunting, MD  Encounter Date: 08/19/2016      PT End of Session - 08/19/16 1152    Visit Number 5   Number of Visits 10   Date for PT Re-Evaluation 09/10/16   Authorization Type Gcodes needed; KX at 15th visit   PT Start Time 1109   PT Stop Time 1147   PT Time Calculation (min) 38 min   Activity Tolerance Patient tolerated treatment well   Behavior During Therapy [facility] for tasks assessed/performed      Past Medical History:  Diagnosis Date  . Asthma   . COPD (chronic obstructive pulmonary disease) ([place])   . Cystocele 08/2011  . Diverticulosis   . Fractured rib 2017  . GERD (gastroesophageal reflux disease)   . Hematuria    GU workup  . Hiatal hernia   . Hypertension   . Melanoma ([place]) 11/2001  . Osteoarthritis   . Osteoporosis   . Scoliosis   . UTI (urinary tract infection)    septic    Past Surgical History:  Procedure Laterality Date  . HYSTEROSCOPY  06/1998   D&C-Benign  . MELANOMA EXCISION      There were no vitals filed for this visit.                       [facility] Adult PT Treatment/Exercise - 08/19/16 0001      Dynamic Standing Balance   Step ups comments: Toe tapping 1 HHA  8 inch box     Neuro Re-ed    Neuro Re-ed Details  Narrow stance, tandem stance, sincle leg stance balance for 30 seconds     Lumbar Exercises: Seated   Long Arc Quad on Chair Strengthening;Both;2 sets;10 reps;Weights   LAQ on Chair Weights (lbs) 5     Knee/Hip Exercises: Aerobic   Nustep L3 x 6 minutes  Therapist present to discuss treatment     Knee/Hip Exercises: Machines for Strengthening   Cybex Leg Press Seat 6 65# Bil 3x10     Knee/Hip Exercises: Standing    Walking with Sports Cord Forward and backward #20/ #10                  PT Short Term Goals - 08/19/16 1158      PT SHORT TERM GOAL #1   Title independent with initial HEP   Time 4   Period Weeks   Status On-going     PT SHORT TERM GOAL #2   Title patient reports 25% reduced pain during daily activities due to improved posture   Time 4   Period Weeks   Status On-going     PT SHORT TERM GOAL #3   Title TUG improved to < or = to 18 sec to demonstrate reduced risk of falls   Time 4   Period Weeks   Status On-going     PT SHORT TERM GOAL #4   Title Able to perform single leg stand for 3 seconds on each side for improved stability during ambulation   Time 4   Period Weeks   Status On-going           PT Long Term Goals - 08/19/16 1158      PT LONG  TERM GOAL #1   Title independent with advanced HEP   Time 8   Period Weeks   Status On-going     PT LONG TERM GOAL #2   Title reports 50% less pain during functional activities   Time 8   Period Weeks   Status On-going               Plan - 08/19/16 1158    Clinical Impression Statement Pt able to tolerate all exercisese well but towards end of session has increased back pain and needing to sit. Showing progress with balance exercises stating they are not as difficult. Pt challegned with walking with sports cord and fearfull of falling needing HHA during backwards stepping. Pt will continue to benefit from skilled therapy for LE strength, core stability, and balance.    Rehab Potential Excellent   Clinical Impairments Affecting Rehab Potential osteoporosis, history of fall, history of rib fracture, hisotry of sepsis causing balance issue   PT Frequency 2x / week   PT Duration 8 weeks   PT Treatment/Interventions ADLs/Self Care Home Management;Cryotherapy;Electrical Stimulation;Moist Heat;Ultrasound;Gait training;Stair training;Functional mobility training;Therapeutic activities;Therapeutic  exercise;Balance training;Neuromuscular re-education;Patient/family education;Manual techniques;Dry needling   PT Next Visit Plan Standing counter balance, leg press, Nustep   Consulted and Agree with Plan of Care Patient      Patient will benefit from skilled therapeutic intervention in order to improve the following deficits and impairments:  Abnormal gait, Decreased activity tolerance, Decreased balance, Decreased endurance, Decreased range of motion, Difficulty walking, Decreased strength, Pain, Increased muscle spasms, Postural dysfunction  Visit Diagnosis: Abnormal posture  Difficulty in walking, not elsewhere classified     Problem List Patient Active Problem List   Diagnosis Date Noted  . Constipation 04/28/2016  . GERD (gastroesophageal reflux disease) 04/28/2016  . Multiple closed fractures of ribs of right side   . Hemothorax on right   . Leukocytosis   . Ribs, multiple fractures 04/08/2016  . Cystocele 08/18/2012  . Sepsis secondary to UTI ([place]) 04/27/2012  . E coli bacteremia 04/27/2012  . Elevation of cardiac enzymes 04/27/2012  . Essential hypertension 04/27/2012  . Asthma 04/27/2012  . Nausea 04/27/2012    Michele [name] PTA 08/19/2016, 12:35 PM  [facility] [address], [place], [place], [address] Phone: [phone]   Fax:  [phone]  Name: Michele [name] MRN: [id number] Date of Birth: [birth year]  PHYSICAL THERAPY DISCHARGE SUMMARY  Visits from Start of Care: 5  Current functional level related to goals / functional outcomes: See above   Remaining deficits: See above   Education / Equipment: HEP  Plan: Patient agrees to discharge.  Patient goals were not met. Patient is being discharged due to not returning since the last visit.  ?????     Michele [name], PT 07/29/17 10:38 AM

## 2016-10-22 ENCOUNTER — Ambulatory Visit (INDEPENDENT_AMBULATORY_CARE_PROVIDER_SITE_OTHER): Payer: Medicare Other | Admitting: Certified Nurse Midwife

## 2016-10-22 ENCOUNTER — Encounter: Payer: Self-pay | Admitting: Certified Nurse Midwife

## 2016-10-22 VITALS — BP 110/80 | HR 68 | Resp 16 | Ht 62.0 in | Wt 111.0 lb

## 2016-10-22 DIAGNOSIS — N8110 Cystocele, unspecified: Secondary | ICD-10-CM

## 2016-10-22 NOTE — Patient Instructions (Signed)
Call with vaginal bleeding , pain, or vaginal odor. And come in

## 2016-10-22 NOTE — Progress Notes (Signed)
Subjective:   83 y.o. WidowedWhite female G3P3 here for pessary check.  Patient has been using following pessary style and size:  #4 ring pessary.  She describes the following issues with the pessary:  none.   Use of protective clothing such as Depends or pads No.. Problems with protective clothing with rash No..  Constipation issues with use of pessary No..  She is not sexually active.     ROS:    no abnormal bleeding, pelvic pain or discharge, no dysuria, trouble voiding or hematuria   no dysuria, trouble voiding or hematuria. Compliant to use of vaginal cream Yes.   General Exam:    LMP 05/26/1997   General appearance: alert, cooperative, appears stated age and no distress   Pelvic: External genitalia:  no lesions and normal escutcheon, atrophic appearance   Before pessary was removed No. prolapse over the pessary   In correct position Yes.                Urethra: appearing urethra with no masses, tenderness or lesions              Vagina: normal appearing vagina with normal color and discharge, no lesions.  There are No abrasions or ulcerations.               Cervix: normal appearance Cervical lesions were not found   Bimanual Exam:  Uterus:  normal size, contour, position, consistency, mobility, non-tender"uterus is normal size, shape, consistency and nontender"}                               Adnexa:    normal adnexa in size, nontender and no masses                             Pessary was removed without difficulty without using forceps.  Pessary was cleansed with Betadine.  Pessary was replaced. Patient tolerated procedure well.    Assessment :  Cystocele- symptomatic with good support with pessary uuse       Normal pelvic exam   Use of pessary continued   Plan:    Return to office in 3 months for recheck.         Warning signs of bleeding, pain or odor reviewed and need to call and come in.    Continue coconut oil use at least 3 times weekly.     Rv prn  An After Visit  Summary was printed and given to the patient.

## 2016-12-10 DIAGNOSIS — J449 Chronic obstructive pulmonary disease, unspecified: Secondary | ICD-10-CM | POA: Insufficient documentation

## 2017-02-12 ENCOUNTER — Ambulatory Visit: Payer: Medicare Other | Admitting: Certified Nurse Midwife

## 2017-02-17 ENCOUNTER — Encounter: Payer: Self-pay | Admitting: Certified Nurse Midwife

## 2017-02-17 ENCOUNTER — Ambulatory Visit (INDEPENDENT_AMBULATORY_CARE_PROVIDER_SITE_OTHER): Payer: Medicare Other | Admitting: Certified Nurse Midwife

## 2017-02-17 VITALS — BP 120/80 | HR 68 | Resp 16 | Ht 62.0 in | Wt 111.0 lb

## 2017-02-17 DIAGNOSIS — N8112 Cystocele, lateral: Secondary | ICD-10-CM

## 2017-02-17 DIAGNOSIS — Z4689 Encounter for fitting and adjustment of other specified devices: Secondary | ICD-10-CM

## 2017-02-17 NOTE — Progress Notes (Signed)
Subjective:   83 y.o. Widowed White female G3P3 here for pessary check.  Patient has been using following pessary style and size:  #4 ring pessary.  She describes the following issues with the pessary:  None. Has noted in vaginal bleeding or pain with use. Still driving herself to appointments. Ambulating without any assistance. Eating well.   Use of protective clothing such as Depends or pads No.. Problems with protective clothing with rash Yes.  .  Constipation issues with use of pessary No..  She is not sexually active.     ROS:   no discharge or pelvic pain,  no abnormal bleeding, pelvic pain or discharge, no dysuria, trouble voiding or hematuria   no dysuria, trouble voiding or hematuria. Compliant to use of vaginal cream Yes.   General Exam:    LMP 05/26/1997   General appearance: alert, cooperative, appears stated age and no distress   Pelvic: External genitalia:  no lesions and atrophic appearance   Before pessary was removed No. prolapse over the pessary   In correct position Yes.                Urethra: not indicated and normal appearing urethra with no masses, tenderness or lesions              Vagina: normal appearing vagina with normal color and discharge, no lesions, atrophic.  There are No abrasions or ulcerations.               Cervix: normal appearance and cervical motion tenderness Cervical lesions were not found   Bimanual Exam:  Uterus:  normal size, contour, position, consistency, mobility, non-tender"uterus is normal size, shape, consistency and nontender"}                               Adnexa:    normal adnexa in size, nontender and no masses and no masses                             Pessary was removed without difficulty without using forceps.  Pessary was cleansed with Betadine.  Pessary was replaced. Patient tolerated procedure well.    Assessment :  Cystocele- symptomatic        Pelvic relaxation     Use of pessary continued      Normal pelvic  exam   Plan:     Reviewed normal findings and not ulcerations. Discussed pessary still fitting well and importance of using coconut oil for moisture to prevent problems with pessary. Warning signs with pessary reviewed. Discussed needs yearly breast exam. Will plan to do at next pessary visit.  Return to office in 3 months for recheck.   PRN    An After Visit Summary was printed and given to the patient.

## 2017-02-17 NOTE — Patient Instructions (Signed)
Call and come in if vaginal bleeding or vaginal pain.

## 2017-06-09 ENCOUNTER — Ambulatory Visit: Payer: Medicare Other | Admitting: Certified Nurse Midwife

## 2017-06-16 ENCOUNTER — Ambulatory Visit: Payer: Medicare Other | Admitting: Certified Nurse Midwife

## 2017-06-16 ENCOUNTER — Telehealth: Payer: Self-pay | Admitting: Certified Nurse Midwife

## 2017-06-16 NOTE — Progress Notes (Deleted)
Subjective:   84 y.o. Widowed{desc; ethnicity:30356} female G3P3 here for pessary check.  Patient has been using following pessary style and size:  ***.  She describes the following issues with the pessary:  ***.   Use of protective clothing such as Depends or pads {yes no:314532}. Problems with protective clothing with rash {yes no:314532}.  Constipation issues with use of pessary {yes no:314532}.  She {ACTION; IS/IS YIF:02774128} sexually active.     ROS:   {gyn NOM:767209::" no abnormal bleeding, pelvic pain or discharge","no breast pain or new or enlarging lumps on self exam"}, {ros urinary peds:310871::"  no dysuria, trouble voiding or hematuria"}. Compliant to use of vaginal cream {yes/no:20286}.   General Exam:    LMP 05/26/1997   General appearance: {general exam:16600}   Pelvic: External genitalia:  {Exam; genitalia female:32129}   Before pessary was removed {yes no:314532} prolapse over the pessary   In correct position {yes no:314532}              Urethra: {urethra:311719::"not indicated"}              Vagina: {vagina:315903::"normal appearing vagina with normal color and discharge, no lesions"}.  There are {yes/no:20286} abrasions or ulcerations.               Cervix: {exam; gyn cervix:30847} Cervical lesions {WERE / WERE NOT:19253} found   Bimanual Exam:  Uterus:  {exam; uterus:12215}"uterus is normal size, shape, consistency and nontender"}                               Adnexa:    {adnexa:311645::"not indicated"}                             Pessary was removed {With/without:5700} difficulty {With-without:32421} using forceps.  Pessary was cleansed with Betadine.  Pessary {WAS/WAS [phone]::"was not"} replaced. Patient tolerated procedure well.    Assessment :  {Diagnoses; gyn:16158}        ***   Use of pessary continued   Plan:    Return to office in {NUMBER 1-10:22536} months for recheck.         ***    An After Visit Summary was printed and given to the  patient.

## 2017-06-16 NOTE — Telephone Encounter (Signed)
Patient called and cancelled her appointment with Melvia Heaps, CNM today for a three month recheck due to feeling dizzy and does not want to drive herself. She rescheduled to next Tuesday, 06/23/17. Patient said, "Please tell Debbi I'md sorry I can't make it today?"

## 2017-06-23 ENCOUNTER — Other Ambulatory Visit: Payer: Self-pay

## 2017-06-23 ENCOUNTER — Ambulatory Visit (INDEPENDENT_AMBULATORY_CARE_PROVIDER_SITE_OTHER): Payer: Medicare Other | Admitting: Certified Nurse Midwife

## 2017-06-23 ENCOUNTER — Encounter: Payer: Self-pay | Admitting: Certified Nurse Midwife

## 2017-06-23 VITALS — BP 148/80 | HR 72 | Resp 16 | Wt 112.2 lb

## 2017-06-23 DIAGNOSIS — Z1231 Encounter for screening mammogram for malignant neoplasm of breast: Secondary | ICD-10-CM | POA: Diagnosis not present

## 2017-06-23 DIAGNOSIS — N8111 Cystocele, midline: Secondary | ICD-10-CM | POA: Diagnosis not present

## 2017-06-23 DIAGNOSIS — N952 Postmenopausal atrophic vaginitis: Secondary | ICD-10-CM

## 2017-06-23 DIAGNOSIS — Z4689 Encounter for fitting and adjustment of other specified devices: Secondary | ICD-10-CM | POA: Diagnosis not present

## 2017-06-23 DIAGNOSIS — Z1239 Encounter for other screening for malignant neoplasm of breast: Secondary | ICD-10-CM

## 2017-06-23 NOTE — Progress Notes (Signed)
Subjective:   84 y.o. Widowed White female G3P3 here for pessary check.  Patient has been using following pessary style and size:  #4 ring pessary. She describes the following issues with the pessary:  none.  Use of protective clothing such as Depends or pads No.. Problems with protective clothing with rash No..  Constipation issues with use of pessary No..  She is not sexually active.     ROS:   no breast pain or new or enlarging lumps on self exam, no vaginal bleeding, no discharge or pelvic pain,  no abnormal bleeding, pelvic pain or discharge,   no dysuria, trouble voiding or hematuria. Not compliant with use of coconut oil.   General Exam:    BP (!) 148/80 (BP Location: Right Arm, Patient Position: Sitting, Cuff Size: Normal)   Pulse 72   Resp 16   Wt 112 lb 3.2 oz (50.9 kg)   LMP 05/26/1997   BMI 20.52 kg/m   General appearance: alert, cooperative and appears stated age   Breasts exam: normal appearance, no skin change, no nipple discharge or nipple change, no masses, tenderness or axillary enlargement or tenderness  Pelvic: External genitalia:  no lesions and atrophic appearance   Before pessary was removed No. prolapse over the pessary   In correct position Yes.                Urethra: not indicated and normal appearing urethra with no masses, tenderness or lesions              Vagina: normal appearing vagina with normal color and discharge, no lesions, atrophic. Cystocele noted grade 3                 Cervix: normal appearance Cervical abrasion and tiny ulceration noted. Non tender.   Bimanual Exam:  Uterus:  normal size, contour, position, consistency, mobility, non-tender                               Adnexa:    normal adnexa in size, nontender and no masses                             Pessary was removed without difficulty without using forceps.  Pessary was cleansed with Betadine.  Pessary was not replaced. .    Assessment :  Normal pelvic exam   Cystocele symptomatic  with pessary support.   Cervical abrasion   Discussed finding with patient and need to leave pessary out for 3 weeks and recheck. Discussed importance of emptying bladder well to prevent UTI. Continue coconut oil use nightly to help with healing of area. Report any bleeding or pain.   Normal breast exam Plan:            Recheck area of concern in 3 weeks and reinsert if needed. Pessary kept here for patient with her identification on bag. Questions addressed.  Rv prn   An After Visit Summary was printed and given to the patient.

## 2017-06-23 NOTE — Patient Instructions (Addendum)
Coconut oil every night in vagina. Recheck 3 weeks If urinary symptoms needs to call in.

## 2017-07-14 ENCOUNTER — Ambulatory Visit: Payer: Medicare Other | Admitting: Certified Nurse Midwife

## 2017-07-21 ENCOUNTER — Ambulatory Visit: Payer: Medicare Other | Admitting: Certified Nurse Midwife

## 2017-08-10 ENCOUNTER — Telehealth: Payer: Self-pay | Admitting: Certified Nurse Midwife

## 2017-08-10 NOTE — Telephone Encounter (Signed)
Patient cancelled 3 week follow up. Says she is doing fine and will call when she need another appointment.

## 2017-08-12 NOTE — Telephone Encounter (Signed)
Unable to reach patient.

## 2017-08-12 NOTE — Telephone Encounter (Signed)
Please contact patient and make sure she is aware of need to advise if not emptying bladder well or pelvic pressure. We would need to see her and reassess for pessary. If no problems she may not need pessary.

## 2017-08-12 NOTE — Telephone Encounter (Signed)
Pt states she is doing fine without the pessary & will call us if any problems.

## 2017-08-20 ENCOUNTER — Ambulatory Visit: Payer: Medicare Other | Admitting: Certified Nurse Midwife

## 2019-04-28 DIAGNOSIS — L989 Disorder of the skin and subcutaneous tissue, unspecified: Secondary | ICD-10-CM | POA: Insufficient documentation

## 2019-08-12 ENCOUNTER — Emergency Department (HOSPITAL_COMMUNITY): Payer: Medicare Other

## 2019-08-12 ENCOUNTER — Inpatient Hospital Stay (HOSPITAL_COMMUNITY)
Admission: EM | Admit: 2019-08-12 | Discharge: 2019-08-14 | DRG: 439 | Disposition: A | Discharge status: Home / Self Care | Payer: Medicare Other | Attending: Family Medicine | Admitting: Family Medicine

## 2019-08-12 ENCOUNTER — Other Ambulatory Visit: Payer: Self-pay

## 2019-08-12 ENCOUNTER — Encounter (HOSPITAL_COMMUNITY): Payer: Self-pay

## 2019-08-12 DIAGNOSIS — K821 Hydrops of gallbladder: Secondary | ICD-10-CM

## 2019-08-12 DIAGNOSIS — Z20822 Contact with and (suspected) exposure to covid-19: Secondary | ICD-10-CM | POA: Diagnosis present

## 2019-08-12 DIAGNOSIS — K838 Other specified diseases of biliary tract: Secondary | ICD-10-CM | POA: Diagnosis present

## 2019-08-12 DIAGNOSIS — E878 Other disorders of electrolyte and fluid balance, not elsewhere classified: Secondary | ICD-10-CM

## 2019-08-12 DIAGNOSIS — Z880 Allergy status to penicillin: Secondary | ICD-10-CM

## 2019-08-12 DIAGNOSIS — I10 Essential (primary) hypertension: Secondary | ICD-10-CM | POA: Diagnosis present

## 2019-08-12 DIAGNOSIS — Z79899 Other long term (current) drug therapy: Secondary | ICD-10-CM

## 2019-08-12 DIAGNOSIS — I700 Atherosclerosis of aorta: Secondary | ICD-10-CM | POA: Diagnosis present

## 2019-08-12 DIAGNOSIS — K449 Diaphragmatic hernia without obstruction or gangrene: Secondary | ICD-10-CM | POA: Diagnosis present

## 2019-08-12 DIAGNOSIS — R748 Abnormal levels of other serum enzymes: Secondary | ICD-10-CM | POA: Diagnosis present

## 2019-08-12 DIAGNOSIS — Z85820 Personal history of malignant melanoma of skin: Secondary | ICD-10-CM

## 2019-08-12 DIAGNOSIS — E871 Hypo-osmolality and hyponatremia: Secondary | ICD-10-CM | POA: Diagnosis present

## 2019-08-12 DIAGNOSIS — I4892 Unspecified atrial flutter: Secondary | ICD-10-CM | POA: Diagnosis present

## 2019-08-12 DIAGNOSIS — K859 Acute pancreatitis without necrosis or infection, unspecified: Principal | ICD-10-CM | POA: Diagnosis present

## 2019-08-12 DIAGNOSIS — R112 Nausea with vomiting, unspecified: Secondary | ICD-10-CM | POA: Diagnosis present

## 2019-08-12 DIAGNOSIS — J449 Chronic obstructive pulmonary disease, unspecified: Secondary | ICD-10-CM | POA: Diagnosis present

## 2019-08-12 DIAGNOSIS — K219 Gastro-esophageal reflux disease without esophagitis: Secondary | ICD-10-CM | POA: Diagnosis present

## 2019-08-12 DIAGNOSIS — M419 Scoliosis, unspecified: Secondary | ICD-10-CM | POA: Diagnosis present

## 2019-08-12 DIAGNOSIS — Z887 Allergy status to serum and vaccine status: Secondary | ICD-10-CM

## 2019-08-12 DIAGNOSIS — Z8249 Family history of ischemic heart disease and other diseases of the circulatory system: Secondary | ICD-10-CM

## 2019-08-12 LAB — URINALYSIS, ROUTINE W REFLEX MICROSCOPIC
Bacteria, UA: NONE SEEN
Bilirubin Urine: NEGATIVE
Glucose, UA: 50 mg/dL — AB
Hgb urine dipstick: NEGATIVE
Ketones, ur: 20 mg/dL — AB
Leukocytes,Ua: NEGATIVE
Nitrite: NEGATIVE
Protein, ur: 30 mg/dL — AB
Specific Gravity, Urine: 1.011 (ref 1.005–1.030)
pH: 7 (ref 5.0–8.0)

## 2019-08-12 LAB — CBC WITH DIFFERENTIAL/PLATELET
Abs Immature Granulocytes: 0.02 10*3/uL (ref 0.00–0.07)
Basophils Absolute: 0 10*3/uL (ref 0.0–0.1)
Basophils Relative: 0 %
Eosinophils Absolute: 0 10*3/uL (ref 0.0–0.5)
Eosinophils Relative: 0 %
HCT: 39.5 % (ref 36.0–46.0)
Hemoglobin: 13.4 g/dL (ref 12.0–15.0)
Immature Granulocytes: 0 %
Lymphocytes Relative: 14 %
Lymphs Abs: 0.9 10*3/uL (ref 0.7–4.0)
MCH: 32.5 pg (ref 26.0–34.0)
MCHC: 33.9 g/dL (ref 30.0–36.0)
MCV: 95.9 fL (ref 80.0–100.0)
Monocytes Absolute: 0.5 10*3/uL (ref 0.1–1.0)
Monocytes Relative: 7 %
Neutro Abs: 5.2 10*3/uL (ref 1.7–7.7)
Neutrophils Relative %: 79 %
Platelets: 265 10*3/uL (ref 150–400)
RBC: 4.12 MIL/uL (ref 3.87–5.11)
RDW: 11.9 % (ref 11.5–15.5)
WBC: 6.6 10*3/uL (ref 4.0–10.5)
nRBC: 0 % (ref 0.0–0.2)

## 2019-08-12 LAB — LIPASE, BLOOD: Lipase: 305 U/L — ABNORMAL HIGH (ref 11–51)

## 2019-08-12 LAB — COMPREHENSIVE METABOLIC PANEL
ALT: 15 U/L (ref 0–44)
AST: 27 U/L (ref 15–41)
Albumin: 4.2 g/dL (ref 3.5–5.0)
Alkaline Phosphatase: 51 U/L (ref 38–126)
Anion gap: 15 (ref 5–15)
BUN: 11 mg/dL (ref 8–23)
CO2: 21 mmol/L — ABNORMAL LOW (ref 22–32)
Calcium: 8.7 mg/dL — ABNORMAL LOW (ref 8.9–10.3)
Chloride: 88 mmol/L — ABNORMAL LOW (ref 98–111)
Creatinine, Ser: 0.69 mg/dL (ref 0.44–1.00)
GFR calc Af Amer: >60 mL/min (ref >60)
GFR calc non Af Amer: >60 mL/min (ref >60)
Glucose, Bld: 150 mg/dL — ABNORMAL HIGH (ref 70–99)
Potassium: 3.4 mmol/L — ABNORMAL LOW (ref 3.5–5.1)
Sodium: 124 mmol/L — ABNORMAL LOW (ref 135–145)
Total Bilirubin: 1.1 mg/dL (ref 0.3–1.2)
Total Protein: 7 g/dL (ref 6.5–8.1)

## 2019-08-12 LAB — TROPONIN I (HIGH SENSITIVITY): Troponin I (High Sensitivity): 9 ng/L (ref <18)

## 2019-08-12 MED ORDER — SODIUM CHLORIDE 0.9 % IV BOLUS
500.0000 mL | Freq: Once | INTRAVENOUS | Status: AC
  Administered 2019-08-12: 500 mL via INTRAVENOUS

## 2019-08-12 MED ORDER — ONDANSETRON HCL 4 MG/2ML IJ SOLN
4.0000 mg | Freq: Once | INTRAMUSCULAR | Status: AC
  Administered 2019-08-12: 4 mg via INTRAVENOUS
  Filled 2019-08-12: qty 2

## 2019-08-12 NOTE — ED Provider Notes (Signed)
[facility] EMERGENCY DEPARTMENT Provider Note   CSN: [id number] Arrival date & time: 08/12/19  2145     History Chief Complaint  Patient presents with  . Nausea  . Irregular Heart Beat    Michele [name] is a 86 y.o. female with a past medical history significant for asthma, COPD, diverticulosis, GERD, and hypertension who presents to the ED due to consistent nausea and numerous episodes of nonbloody, nonbilious emesis that started today.  Spoke to [facility] and is unsure exactly what happened because the RN taking care of the patient has already left for the evening.  Patient has not been given anything for her nausea or vomiting.  Patient denies fever and chills.  Per EMS, patient converting between atrial flutter and normal sinus rhythm with heart rate in 60s to 210s.  Per EMS, patient converted upon arrival.  Patient denies chest pain and shortness of breath.  Chart reviewed.  No history of A. fib or A flutter found in chart.  Patient is not currently on any blood thinners.  Patient denies cough. She has received both of her COVID vaccine.  Patient denies associative abdominal pain and urinary symptoms.  History obtained from patient and past medical records. No interpreter used during encounter.      Past Medical History:  Diagnosis Date  . Asthma   . COPD (chronic obstructive pulmonary disease) ([place])   . Cystocele 08/2011  . Diverticulosis   . Fractured rib 2017  . GERD (gastroesophageal reflux disease)   . Hematuria    GU workup  . Hiatal hernia   . Hypertension   . Melanoma ([place]) 11/2001  . Osteoarthritis   . Osteoporosis   . Scoliosis   . UTI (urinary tract infection)    septic    Patient Active Problem List   Diagnosis Date Noted  . Constipation 04/28/2016  . GERD (gastroesophageal reflux disease) 04/28/2016  . Multiple closed fractures of ribs of right side   . Hemothorax on right   . Leukocytosis   . Ribs, multiple  fractures 04/08/2016  . Cystocele 08/18/2012  . Sepsis secondary to UTI ([place]) 04/27/2012  . E coli bacteremia 04/27/2012  . Elevation of cardiac enzymes 04/27/2012  . Essential hypertension 04/27/2012  . Asthma 04/27/2012  . Nausea 04/27/2012    Past Surgical History:  Procedure Laterality Date  . HYSTEROSCOPY  06/1998   D&C-Benign  . MELANOMA EXCISION       OB History    Gravida  3   Para  3   Term      Preterm      AB      Living  3     SAB      TAB      Ectopic      Multiple      Live Births              Family History  Problem Relation Age of Onset  . Hypertension Father   . Heart attack Father   . Colon cancer Neg Hx     Social History   Tobacco Use  . Smoking status: Never Smoker  . Smokeless tobacco: Never Used  Substance Use Topics  . Alcohol use: No    Alcohol/week: 0.0 standard drinks    Comment: cocktail   . Drug use: No    Home Medications Prior to Admission medications   Medication Sig Start Date End Date Taking? Authorizing Provider  acetaminophen (TYLENOL)  500 MG tablet Take 1,000 mg by mouth 3 (three) times daily.    [provider]  ALPRAZolam Duanne Moron) 0.25 MG tablet Take 0.25 mg by mouth 2 (two) times daily as needed. 06/11/17   [provider]  docusate sodium (COLACE) 100 MG capsule Take 1 capsule (100 mg total) by mouth 2 (two) times daily. 04/09/16   Barton Dubois, MD  feeding supplement (BOOST HIGH PROTEIN) LIQD Take 1 Container by mouth 2 (two) times daily between meals.    [provider]  losartan (COZAAR) 50 MG tablet Take 50 mg by mouth 2 (two) times daily.  10/13/14   [provider]  Melatonin 5 MG TABS Take 1 tablet by mouth at bedtime as needed.    [provider]  montelukast (SINGULAIR) 10 MG tablet Take 10 mg by mouth daily.  12/15/14   [provider]  Multiple Vitamin (MULTIVITAMIN WITH MINERALS) TABS Take 1 tablet by mouth daily.    [provider]  ondansetron (ZOFRAN) 4 MG tablet Take 4 mg by mouth every 8 (eight) hours as needed for nausea.    [provider]  oxyCODONE (OXY IR/ROXICODONE) 5 MG immediate release tablet Take 5 mg by mouth every 6 (six) hours as needed for severe pain.    [provider]  pantoprazole (PROTONIX) 40 MG tablet Take 40 mg by mouth 2 (two) times daily before a meal.    [provider]  polyethylene glycol (MIRALAX) packet Take 17 g by mouth daily. Hold for diarrhea 04/09/16   Barton Dubois, MD  VITAMIN E PO Take 1 tablet by mouth daily.     [provider]    Allergies    Penicillins and Pneumococcal vaccines  Review of Systems   Review of Systems  Constitutional: Negative for chills and fever.  Respiratory: Negative for shortness of breath.   Cardiovascular: Negative for chest pain and leg swelling.  Gastrointestinal: Positive for nausea and vomiting. Negative for abdominal distention and abdominal pain.  Genitourinary: Negative for dysuria.  All other systems reviewed and are negative.   Physical Exam Updated Vital Signs BP (!) 185/92   Pulse 68   Resp (!) 21   Ht 5\' 1"  (1.549 m)   Wt 50.8 kg   LMP 05/26/1997   SpO2 97%   BMI 21.16 kg/m   Physical Exam Vitals and nursing note reviewed.  Constitutional:      General: She is not in acute distress.    Appearance: She is not toxic-appearing.  HENT:     Head: Normocephalic.  Eyes:     Pupils: Pupils are equal, round, and reactive to light.  Cardiovascular:     Rate and Rhythm: Normal rate and regular rhythm.     Pulses: Normal pulses.     Heart sounds: Normal heart sounds. No murmur. No friction rub. No gallop.   Pulmonary:     Effort: Pulmonary effort is normal.     Breath sounds: Normal breath sounds.  Abdominal:     General: Abdomen is flat. Bowel sounds are normal. There is no distension.     Palpations: Abdomen is soft.     Tenderness: There is no abdominal tenderness. There is no  guarding or rebound.     Comments: Abdomen soft, nondistended, nontender to palpation in all quadrants without guarding or peritoneal signs. No rebound.   Musculoskeletal:     Cervical back: Neck supple.     Comments: Able to move all 4 extremities without  difficulty.  Skin:    General: Skin is warm and dry.  Neurological:     General: No focal deficit present.     Mental Status: She is alert.  Psychiatric:        Mood and Affect: Mood normal.        Behavior: Behavior normal.     ED Results / Procedures / Treatments   Labs (all labs ordered are listed, but only abnormal results are displayed) Labs Reviewed  CBC WITH DIFFERENTIAL/PLATELET  COMPREHENSIVE METABOLIC PANEL  LIPASE, BLOOD  URINALYSIS, ROUTINE W REFLEX MICROSCOPIC  TROPONIN I (HIGH SENSITIVITY)    EKG None  Radiology No results found.  Procedures Procedures (including critical care time)  Medications Ordered in ED Medications - No data to display  ED Course  I have reviewed the triage vital signs and the nursing notes.  Pertinent labs & imaging results that were available during my care of the patient were reviewed by me and considered in my medical decision making (see chart for details).  Clinical Course as of Aug 11 2336  Fri Aug 12, 2019  2228 Attempted to call daughter from the number in patient's chart however phone number no longer in service.   [CA]  2314 Lipase(!): 305 [CA]  2328 Sodium(!): 124 [CA]  2328 Troponin I (High Sensitivity): 9 [CA]    Clinical Course User Index [CA] Suzy Bouchard, PA-C   MDM Rules/Calculators/A&P                     86 year old female presents to the ED via EMS from [facility] due to nausea and vomiting that started this morning. Denies abdominal pain, fever, chills. Spoke to [facility]. See note above. Stable vitals. Patient in no acute distress and non-toxic appearing.  Abdomen soft, nondistended, nontender.  Will obtain routine labs and CT abdomen in  addition to troponin.  CBC unremarkable with no leukocytosis.  CMP significant for hyponatremia at 124 with mild hypokalemia at 3.4 during.  Lipase elevated at 305.  Initial troponin normal at 9.  Will obtain delta troponin to rule out ACS.  EKG personally reviewed which demonstrates sinus rhythm with no signs of acute ischemia.  Patient handed off to [facility], PA-C at shift change who will follow-up with CT abdomen/pelvis, reassess, and determine disposition. Patient will most likely need medical admission.  Final Clinical Impression(s) / ED Diagnoses Final diagnoses:  None    Rx / DC Orders ED Discharge Orders    None       Karie Kirks 08/13/19 [id number]    Sherwood Gambler, MD 08/14/19 [phone]

## 2019-08-12 NOTE — ED Triage Notes (Signed)
Pt arrives from [facility] c/o n/v and malaise. A&Ox4, GCS 15. VS: BP 183/84, HR 62, SPO2 97% RA, RR 14, CBG 141. Per EMS pt was in atrial flutter, rate 60s-210s, converted on arrival. Pt denies CP or dyspnea at this time.

## 2019-08-12 NOTE — ED Notes (Signed)
Pt found to be in aflutter once brought to room, HR 75bpm at this time. Pt only complaint of nausea. Crash cart and zoll outside of room. 20G PIV established. Provider to bedside to assess pt.

## 2019-08-13 ENCOUNTER — Observation Stay (HOSPITAL_COMMUNITY): Payer: Medicare Other

## 2019-08-13 ENCOUNTER — Encounter (HOSPITAL_COMMUNITY): Payer: Self-pay | Admitting: Internal Medicine

## 2019-08-13 ENCOUNTER — Emergency Department (HOSPITAL_COMMUNITY): Payer: Medicare Other

## 2019-08-13 DIAGNOSIS — Z20822 Contact with and (suspected) exposure to covid-19: Secondary | ICD-10-CM | POA: Diagnosis present

## 2019-08-13 DIAGNOSIS — Z8249 Family history of ischemic heart disease and other diseases of the circulatory system: Secondary | ICD-10-CM | POA: Diagnosis not present

## 2019-08-13 DIAGNOSIS — K838 Other specified diseases of biliary tract: Secondary | ICD-10-CM | POA: Diagnosis present

## 2019-08-13 DIAGNOSIS — E871 Hypo-osmolality and hyponatremia: Secondary | ICD-10-CM

## 2019-08-13 DIAGNOSIS — K821 Hydrops of gallbladder: Secondary | ICD-10-CM

## 2019-08-13 DIAGNOSIS — J449 Chronic obstructive pulmonary disease, unspecified: Secondary | ICD-10-CM | POA: Diagnosis present

## 2019-08-13 DIAGNOSIS — Z887 Allergy status to serum and vaccine status: Secondary | ICD-10-CM | POA: Diagnosis not present

## 2019-08-13 DIAGNOSIS — R748 Abnormal levels of other serum enzymes: Secondary | ICD-10-CM | POA: Diagnosis not present

## 2019-08-13 DIAGNOSIS — K449 Diaphragmatic hernia without obstruction or gangrene: Secondary | ICD-10-CM | POA: Diagnosis present

## 2019-08-13 DIAGNOSIS — I10 Essential (primary) hypertension: Secondary | ICD-10-CM

## 2019-08-13 DIAGNOSIS — Z880 Allergy status to penicillin: Secondary | ICD-10-CM | POA: Diagnosis not present

## 2019-08-13 DIAGNOSIS — I4892 Unspecified atrial flutter: Secondary | ICD-10-CM | POA: Diagnosis present

## 2019-08-13 DIAGNOSIS — R112 Nausea with vomiting, unspecified: Secondary | ICD-10-CM

## 2019-08-13 DIAGNOSIS — K859 Acute pancreatitis without necrosis or infection, unspecified: Secondary | ICD-10-CM | POA: Diagnosis present

## 2019-08-13 DIAGNOSIS — I700 Atherosclerosis of aorta: Secondary | ICD-10-CM | POA: Diagnosis present

## 2019-08-13 DIAGNOSIS — Z85820 Personal history of malignant melanoma of skin: Secondary | ICD-10-CM | POA: Diagnosis not present

## 2019-08-13 DIAGNOSIS — K219 Gastro-esophageal reflux disease without esophagitis: Secondary | ICD-10-CM | POA: Diagnosis present

## 2019-08-13 DIAGNOSIS — M419 Scoliosis, unspecified: Secondary | ICD-10-CM | POA: Diagnosis present

## 2019-08-13 DIAGNOSIS — Z79899 Other long term (current) drug therapy: Secondary | ICD-10-CM | POA: Diagnosis not present

## 2019-08-13 HISTORY — DX: Atherosclerosis of aorta: I70.0

## 2019-08-13 LAB — COMPREHENSIVE METABOLIC PANEL
ALT: 10 U/L (ref 0–44)
ALT: 10 U/L (ref 0–44)
AST: 20 U/L (ref 15–41)
AST: 20 U/L (ref 15–41)
Albumin: 3 g/dL — ABNORMAL LOW (ref 3.5–5.0)
Albumin: 3.5 g/dL (ref 3.5–5.0)
Alkaline Phosphatase: 38 U/L (ref 38–126)
Alkaline Phosphatase: 41 U/L (ref 38–126)
Anion gap: 11 (ref 5–15)
Anion gap: 13 (ref 5–15)
BUN: 7 mg/dL — ABNORMAL LOW (ref 8–23)
BUN: 9 mg/dL (ref 8–23)
CO2: 15 mmol/L — ABNORMAL LOW (ref 22–32)
CO2: 22 mmol/L (ref 22–32)
Calcium: 7 mg/dL — ABNORMAL LOW (ref 8.9–10.3)
Calcium: 8.5 mg/dL — ABNORMAL LOW (ref 8.9–10.3)
Chloride: 94 mmol/L — ABNORMAL LOW (ref 98–111)
Chloride: 99 mmol/L (ref 98–111)
Creatinine, Ser: 0.49 mg/dL (ref 0.44–1.00)
Creatinine, Ser: 0.66 mg/dL (ref 0.44–1.00)
GFR calc Af Amer: >60 mL/min (ref >60)
GFR calc Af Amer: >60 mL/min (ref >60)
GFR calc non Af Amer: >60 mL/min (ref >60)
GFR calc non Af Amer: >60 mL/min (ref >60)
Glucose, Bld: 116 mg/dL — ABNORMAL HIGH (ref 70–99)
Glucose, Bld: 97 mg/dL (ref 70–99)
Potassium: 2.9 mmol/L — ABNORMAL LOW (ref 3.5–5.1)
Potassium: 3.7 mmol/L (ref 3.5–5.1)
Sodium: 127 mmol/L — ABNORMAL LOW (ref 135–145)
Sodium: 127 mmol/L — ABNORMAL LOW (ref 135–145)
Total Bilirubin: 0.9 mg/dL (ref 0.3–1.2)
Total Bilirubin: 1 mg/dL (ref 0.3–1.2)
Total Protein: 4.9 g/dL — ABNORMAL LOW (ref 6.5–8.1)
Total Protein: 5.7 g/dL — ABNORMAL LOW (ref 6.5–8.1)

## 2019-08-13 LAB — CBC
HCT: 35.8 % — ABNORMAL LOW (ref 36.0–46.0)
Hemoglobin: 12.1 g/dL (ref 12.0–15.0)
MCH: 32.1 pg (ref 26.0–34.0)
MCHC: 33.8 g/dL (ref 30.0–36.0)
MCV: 95 fL (ref 80.0–100.0)
Platelets: 256 10*3/uL (ref 150–400)
RBC: 3.77 MIL/uL — ABNORMAL LOW (ref 3.87–5.11)
RDW: 11.9 % (ref 11.5–15.5)
WBC: 7.5 10*3/uL (ref 4.0–10.5)
nRBC: 0 % (ref 0.0–0.2)

## 2019-08-13 LAB — LACTATE DEHYDROGENASE: LDH: 198 U/L — ABNORMAL HIGH (ref 98–192)

## 2019-08-13 LAB — LIPASE, BLOOD: Lipase: 36 U/L (ref 11–51)

## 2019-08-13 LAB — SARS CORONAVIRUS 2 (TAT 6-24 HRS): SARS Coronavirus 2: NEGATIVE

## 2019-08-13 LAB — TROPONIN I (HIGH SENSITIVITY): Troponin I (High Sensitivity): 8 ng/L (ref <18)

## 2019-08-13 LAB — MRSA PCR SCREENING: MRSA by PCR: NEGATIVE

## 2019-08-13 MED ORDER — MONTELUKAST SODIUM 10 MG PO TABS
10.0000 mg | ORAL_TABLET | Freq: Every day | ORAL | Status: DC
  Administered 2019-08-13 – 2019-08-14 (×2): 10 mg via ORAL
  Filled 2019-08-13 (×2): qty 1

## 2019-08-13 MED ORDER — POTASSIUM CHLORIDE CRYS ER 20 MEQ PO TBCR
20.0000 meq | EXTENDED_RELEASE_TABLET | Freq: Once | ORAL | Status: AC
  Administered 2019-08-13: 20 meq via ORAL
  Filled 2019-08-13 (×2): qty 1

## 2019-08-13 MED ORDER — SODIUM CHLORIDE 0.9 % IV SOLN: INTRAVENOUS | Status: DC

## 2019-08-13 MED ORDER — POLYETHYLENE GLYCOL 3350 17 G PO PACK: 17.0000 g | PACK | Freq: Every day | ORAL | Status: DC

## 2019-08-13 MED ORDER — ACETAMINOPHEN 650 MG RE SUPP: 650.0000 mg | Freq: Four times a day (QID) | RECTAL | Status: DC | PRN

## 2019-08-13 MED ORDER — ALPRAZOLAM 0.25 MG PO TABS: 0.2500 mg | ORAL_TABLET | Freq: Two times a day (BID) | ORAL | Status: DC | PRN

## 2019-08-13 MED ORDER — PANTOPRAZOLE SODIUM 40 MG PO TBEC
40.0000 mg | DELAYED_RELEASE_TABLET | Freq: Two times a day (BID) | ORAL | Status: DC
  Administered 2019-08-13 – 2019-08-14 (×3): 40 mg via ORAL
  Filled 2019-08-13 (×3): qty 1

## 2019-08-13 MED ORDER — LOSARTAN POTASSIUM 50 MG PO TABS
50.0000 mg | ORAL_TABLET | Freq: Two times a day (BID) | ORAL | Status: DC
  Administered 2019-08-13 – 2019-08-14 (×2): 50 mg via ORAL
  Filled 2019-08-13 (×2): qty 1

## 2019-08-13 MED ORDER — ONDANSETRON HCL 4 MG/2ML IJ SOLN: 4.0000 mg | Freq: Four times a day (QID) | INTRAMUSCULAR | Status: DC | PRN

## 2019-08-13 MED ORDER — ENOXAPARIN SODIUM 40 MG/0.4ML ~~LOC~~ SOLN
40.0000 mg | Freq: Every day | SUBCUTANEOUS | Status: DC
  Administered 2019-08-13 – 2019-08-14 (×2): 40 mg via SUBCUTANEOUS
  Filled 2019-08-13 (×2): qty 0.4

## 2019-08-13 MED ORDER — IOHEXOL 300 MG/ML  SOLN
100.0000 mL | Freq: Once | INTRAMUSCULAR | Status: AC | PRN
  Administered 2019-08-13: 100 mL via INTRAVENOUS

## 2019-08-13 MED ORDER — ONDANSETRON HCL 4 MG PO TABS: 4.0000 mg | ORAL_TABLET | Freq: Four times a day (QID) | ORAL | Status: DC | PRN

## 2019-08-13 MED ORDER — ACETAMINOPHEN 325 MG PO TABS
650.0000 mg | ORAL_TABLET | Freq: Four times a day (QID) | ORAL | Status: DC | PRN
  Administered 2019-08-13 – 2019-08-14 (×2): 650 mg via ORAL
  Filled 2019-08-13 (×2): qty 2

## 2019-08-13 MED ORDER — MELATONIN 3 MG PO TABS
4.5000 mg | ORAL_TABLET | Freq: Every evening | ORAL | Status: DC | PRN
  Filled 2019-08-13: qty 1.5

## 2019-08-13 NOTE — Progress Notes (Signed)
Pt ambulated in a hallway, felt some dizziness, but managed to walk 200 ft, pt is scheduled for the procedure tomorrow am, NPO from midnight, pt is aware, daughter is in bed side and updated, IV fluid is continue @ 75, will continue to monitor the patient  Palma Holter, RN

## 2019-08-13 NOTE — ED Notes (Addendum)
Unsuccessful blood draw attempt. RN Tamia informed

## 2019-08-13 NOTE — ED Provider Notes (Signed)
1:00 AM Care assumed from [place], PA-C at shift change. In short, patient is a 86 year old female presenting [facility] the emergency department from [facility] for vomiting.  Hx of COPD, diverticulosis, asthma.  Emesis has been controlled in the ED with Zofran.  Work-up today notable for elevated lipase consistent with acute pancreatitis.  This may be secondary to frequent vomiting/GI upset.  She does have hyponatremia and hypochloremia.  Given IV fluids for likely mild dehydration from decreased PO intake.  CT scan pending at shift change.  Results reviewed which show large hiatal hernia with mild gallbladder distention, but no stones or pericholecystic fluid.  The patient has a benign abdominal exam without tenderness to palpation.  Doubt acalculus cholecystitis or choledocholithiasis in the setting of preserved LFTs.  No leukocytosis.  Given age, persistent symptoms, lipase elevation will admit for observation.  Patient seen and assessed by MD Regenia Skeeter prior to shift change who also advises admission for symptom control.  Consult placed to [facility].  COVID test held as patient has been fully vaccinated.  1:10 AM Dr. Alcario Drought of [facility] to admit.   Results for orders placed or performed during the hospital encounter of 08/12/19  CBC with Differential  Result Value Ref Range   WBC 6.6 4.0 - 10.5 K/uL   RBC 4.12 3.87 - 5.11 MIL/uL   Hemoglobin 13.4 12.0 - 15.0 g/dL   HCT 39.5 36.0 - 46.0 %   MCV 95.9 80.0 - 100.0 fL   MCH 32.5 26.0 - 34.0 pg   MCHC 33.9 30.0 - 36.0 g/dL   RDW 11.9 11.5 - 15.5 %   Platelets 265 150 - 400 K/uL   nRBC 0.0 0.0 - 0.2 %   Neutrophils Relative % 79 %   Neutro Abs 5.2 1.7 - 7.7 K/uL   Lymphocytes Relative 14 %   Lymphs Abs 0.9 0.7 - 4.0 K/uL   Monocytes Relative 7 %   Monocytes Absolute 0.5 0.1 - 1.0 K/uL   Eosinophils Relative 0 %   Eosinophils Absolute 0.0 0.0 - 0.5 K/uL   Basophils Relative 0 %   Basophils Absolute 0.0 0.0 - 0.1 K/uL   Immature Granulocytes 0 %   Abs Immature  Granulocytes 0.02 0.00 - 0.07 K/uL  Comprehensive metabolic panel  Result Value Ref Range   Sodium 124 (L) 135 - 145 mmol/L   Potassium 3.4 (L) 3.5 - 5.1 mmol/L   Chloride 88 (L) 98 - 111 mmol/L   CO2 21 (L) 22 - 32 mmol/L   Glucose, Bld 150 (H) 70 - 99 mg/dL   BUN 11 8 - 23 mg/dL   Creatinine, Ser 0.69 0.44 - 1.00 mg/dL   Calcium 8.7 (L) 8.9 - 10.3 mg/dL   Total Protein 7.0 6.5 - 8.1 g/dL   Albumin 4.2 3.5 - 5.0 g/dL   AST 27 15 - 41 U/L   ALT 15 0 - 44 U/L   Alkaline Phosphatase 51 38 - 126 U/L   Total Bilirubin 1.1 0.3 - 1.2 mg/dL   GFR calc non Af Amer >60 >60 mL/min   GFR calc Af Amer >60 >60 mL/min   Anion gap 15 5 - 15  Lipase, blood  Result Value Ref Range   Lipase 305 (H) 11 - 51 U/L  Urinalysis, Routine w reflex microscopic  Result Value Ref Range   Color, Urine STRAW (A) YELLOW   APPearance CLEAR CLEAR   Specific Gravity, Urine 1.011 1.005 - 1.030   pH 7.0 5.0 - 8.0  Glucose, UA 50 (A) NEGATIVE mg/dL   Hgb urine dipstick NEGATIVE NEGATIVE   Bilirubin Urine NEGATIVE NEGATIVE   Ketones, ur 20 (A) NEGATIVE mg/dL   Protein, ur 30 (A) NEGATIVE mg/dL   Nitrite NEGATIVE NEGATIVE   Leukocytes,Ua NEGATIVE NEGATIVE   RBC / HPF 0-5 0 - 5 RBC/hpf   Bacteria, UA NONE SEEN NONE SEEN   Mucus PRESENT   Troponin I (High Sensitivity)  Result Value Ref Range   Troponin I (High Sensitivity) 9 <18 ng/L   CT ABDOMEN PELVIS W CONTRAST  Result Date: 08/13/2019 CLINICAL DATA:  Nausea vomiting EXAM: CT ABDOMEN AND PELVIS WITH CONTRAST TECHNIQUE: Multidetector CT imaging of the abdomen and pelvis was performed using the standard protocol following bolus administration of intravenous contrast. CONTRAST:  179mL OMNIPAQUE IOHEXOL 300 MG/ML  SOLN COMPARISON:  10/13/2014 FINDINGS: Lower chest: Large hiatal hernia.  No acute abnormality. Hepatobiliary: Gallbladder is distended. No visible stones. Mild intrahepatic and extrahepatic biliary ductal dilatation. No focal hepatic abnormality.  Pancreas: No focal abnormality or ductal dilatation. Spleen: No focal abnormality.  Normal size. Adrenals/Urinary Tract: No adrenal abnormality. No focal renal abnormality. No stones or hydronephrosis. Urinary bladder is unremarkable. Stomach/Bowel: Sigmoid diverticulosis. No active diverticulitis. No evidence of bowel obstruction. Appendix is normal. Vascular/Lymphatic: Aortic atherosclerosis. No enlarged abdominal or pelvic lymph nodes. Reproductive: Uterus and adnexa unremarkable.  No mass. Other: No free fluid or free air. Musculoskeletal: Scoliosis. Degenerative changes. No acute bony abnormality. IMPRESSION: Large hiatal hernia. Mild gallbladder distention. Mild biliary ductal dilatation. No visible stones. Aortic atherosclerosis. Sigmoid diverticulosis. Electronically Signed   By: Rolm Baptise M.D.   On: 08/13/2019 00:32   DG Chest Portable 1 View  Result Date: 08/12/2019 CLINICAL DATA:  Nausea, vomiting, irregular heartbeat EXAM: PORTABLE CHEST 1 VIEW COMPARISON:  CT chest 04/08/2016, radiograph 04/09/2016 FINDINGS: Slightly hyperinflated appearance of the lungs with coarse reticular interstitial opacities similar to comparison exams. Some hazy basilar opacity in the left lung base likely reflecting atelectasis adjacent a large hiatal hernia with some chronic blunting of the left costophrenic sulcus which may reflect scarring or trace chronic effusion. No pneumothorax. The aorta is calcified. The remaining cardiomediastinal contours are unremarkable. Severe dextrocurvature of the lower thoracic spine. Additional degenerative changes noted throughout the spine and shoulders. Calcified mineralization likely within the inferior recess of the right shoulder. High-riding right humeral head may reflect underlying rotator cuff insufficiency. IMPRESSION: 1. No acute cardiopulmonary disease. 2. Stable chronic findings as described above. 3. Large hiatal hernia with adjacent atelectasis. Electronically Signed    By: Lovena Le M.D.   On: 08/12/2019 23:36      Beverely Pace 08/13/19 0112    Palumbo, April, MD 08/13/19 [id number]

## 2019-08-13 NOTE — Plan of Care (Signed)
Completed.

## 2019-08-13 NOTE — H&P (Signed)
History and Physical    Michele [name] [id number] DOB: 28-Jan-1927 DOA: 08/12/2019  PCP: Burnard Bunting, MD  Patient coming from: Home  I have personally briefly reviewed patient's old medical records in [place]  Chief Complaint: N/V  HPI: Michele [name] is a 86 y.o. female with medical history significant of HTN, GERD, COPD, hiatal hernia.  Pt presents to ED with c/o N/V.  Onset today, numerous episodes of emesis.  No fevers nor chills, has had COVID vaccine.  Denies CP or SOB.  Denies abd pain or TTP.  With EMS patient was reportedly between A.Flutter and NSR with HR of 60 to 210s.  On arrival to ED patient converted to NSR and has remained in NSR since.  No h/o A.Fib or flutter in past.  No on blood thinners.  No dysuria.   ED Course: Has remained in NSR in ED.  500cc NS bolus  Sodium 124, Lipase 305, other LFTs look okay.  CT abd/pelvis: Mild dilation of CBD and gallbladder, no stones visible.  Large hiatal hernia.   Review of Systems: As per HPI, otherwise all review of systems negative.  Past Medical History:  Diagnosis Date  . Asthma   . COPD (chronic obstructive pulmonary disease) ([place])   . Cystocele 08/2011  . Diverticulosis   . Fractured rib 2017  . GERD (gastroesophageal reflux disease)   . Hematuria    GU workup  . Hiatal hernia   . Hypertension   . Melanoma ([place]) 11/2001  . Osteoarthritis   . Osteoporosis   . Scoliosis   . UTI (urinary tract infection)    septic    Past Surgical History:  Procedure Laterality Date  . HYSTEROSCOPY  06/1998   D&C-Benign  . MELANOMA EXCISION       reports that she has never smoked. She has never used smokeless tobacco. She reports that she does not drink alcohol or use drugs.  Allergies  Allergen Reactions  . Penicillins Other (See Comments)    Reaction unknown Has patient had a PCN reaction causing immediate rash, facial/tongue/throat swelling, SOB or lightheadedness with hypotension:  n/a Has patient had a PCN reaction causing severe rash involving mucus membranes or skin necrosis: n/a Has patient had a PCN reaction that required hospitalization: n/a Has patient had a PCN reaction occurring within the last 10 years: n/a If all of the above answers are "NO", then may proceed with Cephalosporin use.   . Pneumococcal Vaccines Other (See Comments)    Reaction unknown    Family History  Problem Relation Age of Onset  . Hypertension Father   . Heart attack Father   . Colon cancer Neg Hx      Prior to Admission medications   Medication Sig Start Date End Date Taking? Authorizing Provider  acetaminophen (TYLENOL) 500 MG tablet Take 1,000 mg by mouth 3 (three) times daily.    [provider]  ALPRAZolam Duanne Moron) 0.25 MG tablet Take 0.25 mg by mouth 2 (two) times daily as needed. 06/11/17   [provider]  docusate sodium (COLACE) 100 MG capsule Take 1 capsule (100 mg total) by mouth 2 (two) times daily. 04/09/16   Barton Dubois, MD  feeding supplement (BOOST HIGH PROTEIN) LIQD Take 1 Container by mouth 2 (two) times daily between meals.    [provider]  losartan (COZAAR) 50 MG tablet Take 50 mg by mouth 2 (two) times daily.  10/13/14   [provider]  Melatonin 5 MG TABS  Take 1 tablet by mouth at bedtime as needed.    [provider]  montelukast (SINGULAIR) 10 MG tablet Take 10 mg by mouth daily.  12/15/14   [provider]  Multiple Vitamin (MULTIVITAMIN WITH MINERALS) TABS Take 1 tablet by mouth daily.    [provider]  ondansetron (ZOFRAN) 4 MG tablet Take 4 mg by mouth every 8 (eight) hours as needed for nausea.    [provider]  oxyCODONE (OXY IR/ROXICODONE) 5 MG immediate release tablet Take 5 mg by mouth every 6 (six) hours as needed for severe pain.    [provider]  pantoprazole (PROTONIX) 40 MG tablet Take 40 mg by mouth 2 (two) times daily before a meal.    [provider]  polyethylene glycol (MIRALAX) packet Take 17 g by mouth daily. Hold for diarrhea 04/09/16   Barton Dubois, MD  VITAMIN E PO Take 1 tablet by mouth daily.     [provider]    Physical Exam: Vitals:   08/12/19 2200 08/12/19 2215 08/12/19 2230 08/12/19 2320  BP: (!) 185/92  (!) 175/86   Pulse: 64 68 65   Resp: (!) 23 (!) 21 (!) 23   Temp:    97.7 F (36.5 C)  TempSrc:    Oral  SpO2: 98% 97% 91%   Weight:      Height:        Constitutional: NAD, calm, comfortable Eyes: PERRL, lids and conjunctivae normal ENMT: Mucous membranes are moist. Posterior pharynx clear of any exudate or lesions.Normal dentition.  Neck: normal, supple, no masses, no thyromegaly Respiratory: clear to auscultation bilaterally, no wheezing, no crackles. Normal respiratory effort. No accessory muscle use.  Cardiovascular: Regular rate and rhythm, no murmurs / rubs / gallops. No extremity edema. 2+ pedal pulses. No carotid bruits.  Abdomen: no tenderness, no masses palpated. No hepatosplenomegaly. Bowel sounds positive.  Musculoskeletal: no clubbing / cyanosis. No joint deformity upper and lower extremities. Good ROM, no contractures. Normal muscle tone.  Skin: no rashes, lesions, ulcers. No induration Neurologic: CN 2-12 grossly intact. Sensation intact, DTR normal. Strength 5/5 in all 4.  Psychiatric: Normal judgment and insight. Alert and oriented x 3. Normal mood.    Labs on Admission: I have personally reviewed following labs and imaging studies  CBC: Recent Labs  Lab 08/12/19 2209  WBC 6.6  NEUTROABS 5.2  HGB 13.4  HCT 39.5  MCV 95.9  PLT 99991111   Basic Metabolic Panel: Recent Labs  Lab 08/12/19 2209  NA 124*  K 3.4*  CL 88*  CO2 21*  GLUCOSE 150*  BUN 11  CREATININE 0.69  CALCIUM 8.7*   GFR: Estimated Creatinine Clearance: 35.3 mL/min (by C-G formula based on SCr of 0.69 mg/dL). Liver Function Tests: Recent Labs  Lab 08/12/19 2209  AST 27  ALT 15   ALKPHOS 51  BILITOT 1.1  PROT 7.0  ALBUMIN 4.2   Recent Labs  Lab 08/12/19 2209  LIPASE 305*   No results for input(s): AMMONIA in the last 168 hours. Coagulation Profile: No results for input(s): INR, PROTIME in the last 168 hours. Cardiac Enzymes: No results for input(s): CKTOTAL, CKMB, CKMBINDEX, TROPONINI in the last 168 hours. BNP (last 3 results) No results for input(s): PROBNP in the last 8760 hours. HbA1C: No results for input(s): HGBA1C in the last 72 hours. CBG: No results for input(s): GLUCAP in the last 168 hours. Lipid Profile: No results for input(s): CHOL, HDL, LDLCALC, TRIG, CHOLHDL, LDLDIRECT  in the last 72 hours. Thyroid Function Tests: No results for input(s): TSH, T4TOTAL, FREET4, T3FREE, THYROIDAB in the last 72 hours. Anemia Panel: No results for input(s): VITAMINB12, FOLATE, FERRITIN, TIBC, IRON, RETICCTPCT in the last 72 hours. Urine analysis:    Component Value Date/Time   COLORURINE STRAW (A) 08/12/2019 2209   APPEARANCEUR CLEAR 08/12/2019 2209   LABSPEC 1.011 08/12/2019 2209   PHURINE 7.0 08/12/2019 2209   GLUCOSEU 50 (A) 08/12/2019 2209   HGBUR NEGATIVE 08/12/2019 2209   BILIRUBINUR NEGATIVE 08/12/2019 2209   BILIRUBINUR n 08/18/2012 1515   KETONESUR 20 (A) 08/12/2019 2209   PROTEINUR 30 (A) 08/12/2019 2209   UROBILINOGEN 0.2 10/13/2014 0334   NITRITE NEGATIVE 08/12/2019 2209   LEUKOCYTESUR NEGATIVE 08/12/2019 2209    Radiological Exams on Admission: CT ABDOMEN PELVIS W CONTRAST  Result Date: 08/13/2019 CLINICAL DATA:  Nausea vomiting EXAM: CT ABDOMEN AND PELVIS WITH CONTRAST TECHNIQUE: Multidetector CT imaging of the abdomen and pelvis was performed using the standard protocol following bolus administration of intravenous contrast. CONTRAST:  189mL OMNIPAQUE IOHEXOL 300 MG/ML  SOLN COMPARISON:  10/13/2014 FINDINGS: Lower chest: Large hiatal hernia.  No acute abnormality. Hepatobiliary: Gallbladder is distended. No visible stones. Mild  intrahepatic and extrahepatic biliary ductal dilatation. No focal hepatic abnormality. Pancreas: No focal abnormality or ductal dilatation. Spleen: No focal abnormality.  Normal size. Adrenals/Urinary Tract: No adrenal abnormality. No focal renal abnormality. No stones or hydronephrosis. Urinary bladder is unremarkable. Stomach/Bowel: Sigmoid diverticulosis. No active diverticulitis. No evidence of bowel obstruction. Appendix is normal. Vascular/Lymphatic: Aortic atherosclerosis. No enlarged abdominal or pelvic lymph nodes. Reproductive: Uterus and adnexa unremarkable.  No mass. Other: No free fluid or free air. Musculoskeletal: Scoliosis. Degenerative changes. No acute bony abnormality. IMPRESSION: Large hiatal hernia. Mild gallbladder distention. Mild biliary ductal dilatation. No visible stones. Aortic atherosclerosis. Sigmoid diverticulosis. Electronically Signed   By: Rolm Baptise M.D.   On: 08/13/2019 00:32   DG Chest Portable 1 View  Result Date: 08/12/2019 CLINICAL DATA:  Nausea, vomiting, irregular heartbeat EXAM: PORTABLE CHEST 1 VIEW COMPARISON:  CT chest 04/08/2016, radiograph 04/09/2016 FINDINGS: Slightly hyperinflated appearance of the lungs with coarse reticular interstitial opacities similar to comparison exams. Some hazy basilar opacity in the left lung base likely reflecting atelectasis adjacent a large hiatal hernia with some chronic blunting of the left costophrenic sulcus which may reflect scarring or trace chronic effusion. No pneumothorax. The aorta is calcified. The remaining cardiomediastinal contours are unremarkable. Severe dextrocurvature of the lower thoracic spine. Additional degenerative changes noted throughout the spine and shoulders. Calcified mineralization likely within the inferior recess of the right shoulder. High-riding right humeral head may reflect underlying rotator cuff insufficiency. IMPRESSION: 1. No acute cardiopulmonary disease. 2. Stable chronic findings as  described above. 3. Large hiatal hernia with adjacent atelectasis. Electronically Signed   By: Lovena Le M.D.   On: 08/12/2019 23:36    EKG: Independently reviewed.  Assessment/Plan Principal Problem:   Nausea and vomiting Active Problems:   Essential hypertension   Elevated lipase   Dilated cbd, acquired    1. N/V - 1. Cause: 1. Gastroenteritis? 2. Lipase elevated which might suggest pancreatitis, but no inflamation on CT scan and no abd pain 3. No obstruction, no bowel inflammation seen on CT scan. 2. Will get RUQ [place] for lipase elevation and CBD dilation to further evaluate 3. Clear liquid diet for the moment 4. zofran PRN nausea 5. IVF: NS at 75 cc/hr 6. Repeat CMP and CBC in AM 2. Hyponatremia -  1. IVF: 500cc bolus in ED and NS at 75 cc/hr 2. Repeat in AM 3. Med rec pending, but hold any diuretics 3. HTN - 1. Med rec pending 2. Cont home meds except diuretics  DVT prophylaxis: Lovenox Code Status: Full Family Communication: Daughter at bedside Disposition Plan: Home after admit Consults called: None Admission status: Place in [place]    Lissete Maestas, [place] [facility]  How to contact the [facility] Attending or Consulting provider [place] or covering provider during after hours [place], for this patient?  1. Check the care team in [facility] and look for a) attending/consulting TRH provider listed and b) the [facility] team listed 2. Log into [email or web address]  Amion Physician Scheduling and messaging for groups and whole hospitals  On call and physician scheduling software for group practices, residents, hospitalists and other medical providers for call, clinic, rotation and shift schedules. OnCall [employer] is a hospital-wide system for scheduling doctors and paging doctors on call. EasyPlot is for scientific plotting and data analysis.  [email or web address]  and use [place]'s universal password to access. If you do not have the password, please contact the hospital operator.   3. Locate the [facility] provider you are looking for under [facility] and page to a number that you can be directly reached. 4. If you still have difficulty reaching the provider, please page the [facility] (Director on Call) for the Hospitalists listed on amion for assistance.  08/13/2019, 1:39 AM

## 2019-08-13 NOTE — Progress Notes (Signed)
PROGRESS NOTE  Michele [name] [id number] DOB: January 11, 1927 DOA: 08/12/2019 PCP: Burnard Bunting, MD  Brief History   86 year old woman presented with nausea, vomiting without abdominal pain.  CT abdomen pelvis was unrevealing but right upper quadrant ultrasound showed gallbladder hydrops and lipase was noted to be elevated.  A & P  Nausea, vomiting with mild elevation of lipase on admission and distended hydropic gallbladder on ultrasound --Vomiting resolved but still nauseous and no appetite.  No abdominal pain on exam but concern for gallbladder dysfunction or cystic duct obstruction --HIDA scan not available until tomorrow.  Clear liquid diet today and n.p.o. after midnight. --Repeat CMP in a.m.  Lipase is normalized.  Acute on chronic hyponatremia dating back to 2013, probably secondary to nausea and vomiting --Appears asymptomatic.  Continue normal saline.  Follow serial BMP.  Essential hypertension --Stable.  Aortic atherosclerosis --No treatment indicated  Disposition Plan:  From: independent living Anticipated disposition: same Discussion: Still nausea with no appetite.  Abdominal ultrasound abnormal showing hydropic gallbladder, cannot rule out cystic duct obstruction.  Given ongoing symptoms, needs further evaluation with HIDA scan.  Continue fluids and recheck laboratory studies in the morning.  DVT prophylaxis: enoxaparin Code Status: Full Family Communication: daughter at bedside    Murray Hodgkins, MD  [facility] Direct contact: see [email or web address] (further directions at bottom of note if needed) 7PM-7AM contact night coverage as at bottom of note 08/13/2019, 2:09 PM  LOS: 0 days   Significant Hospital Events   . Admitted 3/19 for nausea and vomiting   Consults:  .    Procedures:  .   Significant Diagnostic Tests:  . CT abdomen pelvis large hiatal hernia, mild gallbladder distention, mild biliary ductal dilatation . Right upper quadrant  ultrasound distended and hydropic gallbladder, cannot exclude cystic duct obstruction or acute cholecystitis.   Micro Data:  . SARS-CoV-2 negative   Antimicrobials:  .   Interval History/Subjective  Still has nausea but no vomiting.  Was able to drink some liquids this morning.  No appetite, "I do not want even to look at food".  No abdominal pain.  Objective   Vitals:  Vitals:   08/13/19 1100 08/13/19 1159  BP: (!) 159/101 140/84  Pulse:  68  Resp: 15 18  Temp:  98.1 F (36.7 C)  SpO2:  98%    Exam:  Constitutional.  Appears calm, comfortable. Respiratory.  Clear to auscultation bilaterally.  No wheezes, rales or rhonchi.  Normal respiratory effort. Cardiovascular.  Regular rate and rhythm.  No murmur, rub or gallop.  No lower extremity edema. Abdomen appears unremarkable.  Soft, nontender, nondistended. Psychiatric.  Grossly normal mood and affect.  Speech fluent and appropriate.  I have personally reviewed the following:   Today's Data  . Sodium 127, remainder BMP unremarkable.  No change in sodium compared to last night. . Lipase down to 36.  Remainder LFTs unremarkable. . CBC unremarkable. Marland Kitchen SARS-CoV-2 negative  Scheduled Meds: . enoxaparin (LOVENOX) injection  40 mg Subcutaneous Daily  . montelukast  10 mg Oral Daily  . pantoprazole  40 mg Oral BID AC  . polyethylene glycol  17 g Oral Daily   Continuous Infusions: . sodium chloride 75 mL/hr at 08/13/19 [id number]    Principal Problem:   Nausea and vomiting Active Problems:   Essential hypertension   Elevated lipase   Dilated cbd, acquired   Gallbladder hydrops   Hyponatremia   Aortic atherosclerosis (HCC)   LOS: 0 days   How to contact the  [facility] Attending or Consulting provider [place] or covering provider during after hours [place], for this patient?  1. Check the care team in [facility] and look for a) attending/consulting TRH provider listed and b) the [facility] team listed 2. Log into [email or web address] and use [facility]'s universal password to access. If you do not have the password, please contact the hospital operator. 3. Locate the [facility] provider you are looking for under [facility] and page to a number that you can be directly reached. 4. If you still have difficulty reaching the provider, please page the [facility] (Director on Call) for the Hospitalists listed on amion for assistance.

## 2019-08-13 NOTE — Plan of Care (Signed)

## 2019-08-14 ENCOUNTER — Inpatient Hospital Stay (HOSPITAL_COMMUNITY): Payer: Medicare Other

## 2019-08-14 LAB — COMPREHENSIVE METABOLIC PANEL
ALT: 10 U/L (ref 0–44)
AST: 20 U/L (ref 15–41)
Albumin: 3.3 g/dL — ABNORMAL LOW (ref 3.5–5.0)
Alkaline Phosphatase: 38 U/L (ref 38–126)
Anion gap: 10 (ref 5–15)
BUN: 8 mg/dL (ref 8–23)
CO2: 22 mmol/L (ref 22–32)
Calcium: 8.9 mg/dL (ref 8.9–10.3)
Chloride: 104 mmol/L (ref 98–111)
Creatinine, Ser: 0.68 mg/dL (ref 0.44–1.00)
GFR calc Af Amer: >60 mL/min (ref >60)
GFR calc non Af Amer: >60 mL/min (ref >60)
Glucose, Bld: 91 mg/dL (ref 70–99)
Potassium: 3.6 mmol/L (ref 3.5–5.1)
Sodium: 136 mmol/L (ref 135–145)
Total Bilirubin: 1.1 mg/dL (ref 0.3–1.2)
Total Protein: 5.1 g/dL — ABNORMAL LOW (ref 6.5–8.1)

## 2019-08-14 LAB — CBC
HCT: 34.4 % — ABNORMAL LOW (ref 36.0–46.0)
Hemoglobin: 11.7 g/dL — ABNORMAL LOW (ref 12.0–15.0)
MCH: 32.5 pg (ref 26.0–34.0)
MCHC: 34 g/dL (ref 30.0–36.0)
MCV: 95.6 fL (ref 80.0–100.0)
Platelets: 238 10*3/uL (ref 150–400)
RBC: 3.6 MIL/uL — ABNORMAL LOW (ref 3.87–5.11)
RDW: 12.1 % (ref 11.5–15.5)
WBC: 5.2 10*3/uL (ref 4.0–10.5)
nRBC: 0 % (ref 0.0–0.2)

## 2019-08-14 MED ORDER — TECHNETIUM TC 99M MEBROFENIN IV KIT
5.0300 | PACK | Freq: Once | INTRAVENOUS | Status: AC | PRN
  Administered 2019-08-14: 5.03 via INTRAVENOUS

## 2019-08-14 NOTE — Progress Notes (Signed)
Transport to arrived to take pt to procedure. Bed alarm going off.  Pt setting on the side of bed.  Assisted up to bedside commode.  Offered multiple times to change bed which was wet from urine. Pt refused wanted to go back to sleep.  Placed clean bed pads x 2.  Will continue to monitor. Saunders Revel T

## 2019-08-14 NOTE — Progress Notes (Signed)
[facility], discharge instructions provided and patient showed understanding to it, IV taken out,Telemonitor DC,pt left unit in wheelchair with all of the belongings accompanied with a family member (daughter)  Palma Holter, Therapist, sports

## 2019-08-14 NOTE — Discharge Summary (Signed)
Physician Discharge Summary  Michele [name] [id number] DOB: January 16, 1927 DOA: 08/12/2019  PCP: Burnard Bunting, MD  Admit date: 08/12/2019 Discharge date: 08/14/2019  Recommendations for Outpatient Follow-up:  1. Follow-up nausea and vomiting, possible dysfunctional gallbladder.  See discussion below.  2. Patient reports reflux and chronic nausea.  Significance unclear.  Follow-up Information    Burnard Bunting, MD. Schedule an appointment as soon as possible for a visit in 2 week(s).   Specialty: Internal Medicine Contact information: [place]  [id number] [phone]            Discharge Diagnoses: Principal diagnosis is #1 1. Nausea, vomiting with mild elevation of lipase 2. Hydropic gallbladder 3. Acute on chronic hyponatremia 4. Essential hypertension 5. Aortic atherosclerosis  Discharge Condition: imporved Disposition: home  Diet recommendation: low fat  Filed Weights   08/12/19 2150  Weight: 50.8 kg    History of present illness:  86 year old woman presented with nausea, vomiting without abdominal pain.  CT abdomen pelvis was unrevealing but right upper quadrant ultrasound showed gallbladder hydrops and lipase was noted to be elevated.  Hospital Course:  Patient was treated symptomatically for nausea and vomiting with spontaneous resolution.  Mild elevation of lipase clinically insignificant and resolved with supportive care.  Ultrasound the gallbladder did show hydropic gallbladder of unclear etiology.  Gallbladder dysfunction or cystic duct obstruction was considered.  HIDA scan was performed and was entirely normal by report.  Patient symptoms resolved.  Long discussion with patient and daughter at bedside, reviewed differential which includes dysfunctional gallbladder or transient duct obstruction.  We reviewed that there was no evidence of cholecystitis.  We reviewed that her symptoms may recur and she should monitor for symptoms  including right upper quadrant pain persistent nausea or vomiting.  We discussed that although gallbladder appeared to be functioning normally, she may have further problems with it and need further care.  All questions answered to daughter and patient satisfaction.  Recommended low-fat diet.  Chronic hyponatremia resolved, no further inpatient evaluation suggested.  Essential hypertension remained stable.  Aortic atherosclerosis.  No treatment indicated.  Significant Diagnostic Tests:   CT abdomen pelvis large hiatal hernia, mild gallbladder distention, mild biliary ductal dilatation  Right upper quadrant ultrasound distended and hydropic gallbladder, cannot exclude cystic duct obstruction or acute cholecystitis.  HIDA scan normal  Micro Data:   SARS-CoV-2 negative  Today's assessment: S: Feels fine today.  No nausea or vomiting.  No abdominal pain.  Ready to go home.  Tolerated diet. O: Vitals:  Vitals:   08/14/19 1203 08/14/19 1302  BP: (!) 165/79 (!) 146/91  Pulse: 85   Resp: 18   Temp: 98 F (36.7 C)   SpO2: 100%     Constitutional:  . Appears calm and comfortable Respiratory:  . CTA bilaterally, no w/r/r.  . Respiratory effort normal.  Cardiovascular:  . RRR, no m/r/g Abdomen:  . Soft Psychiatric:  . Mental status o Mood, affect appropriate  Complete metabolic panel unremarkable.  CBC unremarkable. HIDA scan normal.  Discharge Instructions  Discharge Instructions    Activity as tolerated - No restrictions   Complete by: As directed    Discharge instructions   Complete by: As directed    Call your physician or seek immediate medical attention for abdominal pain, vomiting, severe nausea, inability to eat, fever or worsening of condition. Recommend low-fat diet.     Allergies as of 08/14/2019      Reactions   Penicillins Other (See Comments)  Reaction unknown Has patient had a PCN reaction causing immediate rash, facial/tongue/throat swelling, SOB  or lightheadedness with hypotension: n/a Has patient had a PCN reaction causing severe rash involving mucus membranes or skin necrosis: n/a Has patient had a PCN reaction that required hospitalization: n/a Has patient had a PCN reaction occurring within the last 10 years: n/a If all of the above answers are "NO", then may proceed with Cephalosporin use.   Pneumococcal Vaccines Other (See Comments)   Reaction unknown      Medication List    TAKE these medications   ALPRAZolam 0.25 MG tablet Commonly known as: XANAX Take 0.25 mg by mouth 2 (two) times daily as needed.   docusate sodium 100 MG capsule Commonly known as: Colace Take 1 capsule (100 mg total) by mouth 2 (two) times daily.   feeding supplement Liqd Take 1 Container by mouth 2 (two) times daily between meals.   losartan 50 MG tablet Commonly known as: COZAAR Take 50 mg by mouth 2 (two) times daily. Notes to patient: This evening 3/21   Melatonin 5 MG Tabs Take 1 tablet by mouth at bedtime as needed.   montelukast 10 MG tablet Commonly known as: SINGULAIR Take 10 mg by mouth daily. Notes to patient: 08/15/2018   multivitamin with minerals Tabs tablet Take 1 tablet by mouth daily.   ondansetron 4 MG tablet Commonly known as: ZOFRAN Take 4 mg by mouth every 8 (eight) hours as needed for nausea.   oxyCODONE 5 MG immediate release tablet Commonly known as: Oxy IR/ROXICODONE Take 5 mg by mouth every 6 (six) hours as needed for severe pain.   pantoprazole 40 MG tablet Commonly known as: PROTONIX Take 40 mg by mouth 2 (two) times daily before a meal. Notes to patient: This evening   polyethylene glycol 17 g packet Commonly known as: MiraLax Take 17 g by mouth daily. Hold for diarrhea   TYLENOL 500 MG tablet Generic drug: acetaminophen Take 1,000 mg by mouth 3 (three) times daily.   VITAMIN E PO Take 1 tablet by mouth daily. Notes to patient: 08/14/2019      Allergies  Allergen Reactions  .  Penicillins Other (See Comments)    Reaction unknown Has patient had a PCN reaction causing immediate rash, facial/tongue/throat swelling, SOB or lightheadedness with hypotension: n/a Has patient had a PCN reaction causing severe rash involving mucus membranes or skin necrosis: n/a Has patient had a PCN reaction that required hospitalization: n/a Has patient had a PCN reaction occurring within the last 10 years: n/a If all of the above answers are "NO", then may proceed with Cephalosporin use.   . Pneumococcal Vaccines Other (See Comments)    Reaction unknown    The results of significant diagnostics from this hospitalization (including imaging, microbiology, ancillary and laboratory) are listed below for reference.    Significant Diagnostic Studies: NM Hepatobiliary Liver Func  Result Date: 08/14/2019 CLINICAL DATA:  86 year old female with nausea and vomiting. EXAM: NUCLEAR MEDICINE HEPATOBILIARY IMAGING TECHNIQUE: Sequential images of the abdomen were obtained out to 60 minutes following intravenous administration of radiopharmaceutical. RADIOPHARMACEUTICALS:  5.03 mCi Tc-75m  Choletec IV COMPARISON:  Abdominal ultrasound and CT scan of the abdomen and pelvis obtained yesterday FINDINGS: Prompt uptake and biliary excretion of activity by the liver is seen. Gallbladder activity is visualized, consistent with patency of cystic duct. Biliary activity passes into small bowel, consistent with patent common bile duct. IMPRESSION: Normal exam with excellent visualization of the gallbladder and passage of  radiotracer into the small bowel. Electronically Signed   By: Jacqulynn Cadet M.D.   On: 08/14/2019 10:16   CT ABDOMEN PELVIS W CONTRAST  Result Date: 08/13/2019 CLINICAL DATA:  Nausea vomiting EXAM: CT ABDOMEN AND PELVIS WITH CONTRAST TECHNIQUE: Multidetector CT imaging of the abdomen and pelvis was performed using the standard protocol following bolus administration of intravenous contrast.  CONTRAST:  166mL OMNIPAQUE IOHEXOL 300 MG/ML  SOLN COMPARISON:  10/13/2014 FINDINGS: Lower chest: Large hiatal hernia.  No acute abnormality. Hepatobiliary: Gallbladder is distended. No visible stones. Mild intrahepatic and extrahepatic biliary ductal dilatation. No focal hepatic abnormality. Pancreas: No focal abnormality or ductal dilatation. Spleen: No focal abnormality.  Normal size. Adrenals/Urinary Tract: No adrenal abnormality. No focal renal abnormality. No stones or hydronephrosis. Urinary bladder is unremarkable. Stomach/Bowel: Sigmoid diverticulosis. No active diverticulitis. No evidence of bowel obstruction. Appendix is normal. Vascular/Lymphatic: Aortic atherosclerosis. No enlarged abdominal or pelvic lymph nodes. Reproductive: Uterus and adnexa unremarkable.  No mass. Other: No free fluid or free air. Musculoskeletal: Scoliosis. Degenerative changes. No acute bony abnormality. IMPRESSION: Large hiatal hernia. Mild gallbladder distention. Mild biliary ductal dilatation. No visible stones. Aortic atherosclerosis. Sigmoid diverticulosis. Electronically Signed   By: Rolm Baptise M.D.   On: 08/13/2019 00:32   DG Chest Portable 1 View  Result Date: 08/12/2019 CLINICAL DATA:  Nausea, vomiting, irregular heartbeat EXAM: PORTABLE CHEST 1 VIEW COMPARISON:  CT chest 04/08/2016, radiograph 04/09/2016 FINDINGS: Slightly hyperinflated appearance of the lungs with coarse reticular interstitial opacities similar to comparison exams. Some hazy basilar opacity in the left lung base likely reflecting atelectasis adjacent a large hiatal hernia with some chronic blunting of the left costophrenic sulcus which may reflect scarring or trace chronic effusion. No pneumothorax. The aorta is calcified. The remaining cardiomediastinal contours are unremarkable. Severe dextrocurvature of the lower thoracic spine. Additional degenerative changes noted throughout the spine and shoulders. Calcified mineralization likely within the  inferior recess of the right shoulder. High-riding right humeral head may reflect underlying rotator cuff insufficiency. IMPRESSION: 1. No acute cardiopulmonary disease. 2. Stable chronic findings as described above. 3. Large hiatal hernia with adjacent atelectasis. Electronically Signed   By: Lovena Le M.D.   On: 08/12/2019 23:36   US Abdomen Limited RUQ  Result Date: 08/13/2019 CLINICAL DATA:  Acute pancreatitis, dilated CBD EXAM: ULTRASOUND ABDOMEN LIMITED RIGHT UPPER QUADRANT COMPARISON:  CT abdomen pelvis 08/13/2019 FINDINGS: Gallbladder: Gallbladder is markedly distended measuring up to 8.8 x 3.6 x 5.4 cm. No frank gallbladder wall thickening. No visible calcified gallstones. Sonographic Percell Miller sign is reportedly negative. Common bile duct: Diameter: 4.9 mm proximally, 3.9 mm distally. Nondilated. No visible intraductal gallstones. Liver: There is a 1.9 x 1.3 x 1.4 cm lobular echogenic focus in the posterior right lobe liver which appears transient and mobile on the cinematic sweep images. Suspect this most likely reflects a side lobe artifact particularly given the absence of a correlate on the same day CT. Portal vein is patent on color Doppler imaging with normal direction of blood flow towards the liver. Other: Difficulty with patient positioning. Exam was performed in the left lateral decubitus position. IMPRESSION: Distended and hydropic gallbladder. While additional features of acute cholecystitis are absent findings are groove local for acute cholecystitis or may reflect a cystic duct obstruction. Could consider further evaluation with HIDA as clinically warranted. Mobile echogenic focus projecting over the right lobe liver has an appearance most compatible with artifact, likely side lobe artifact with absent CT correlate on the same day exam. Electronically  Signed   By: Lovena Le M.D.   On: 08/13/2019 03:32    Microbiology: Recent Results (from the past 240 hour(s))  MRSA PCR Screening      Status: None   Collection Time: 08/13/19  3:15 AM   Specimen: Nasopharyngeal  Result Value Ref Range Status   MRSA by PCR NEGATIVE NEGATIVE Final    Comment:        The GeneXpert MRSA Assay (FDA approved for NASAL specimens only), is one component of a comprehensive MRSA colonization surveillance program. It is not intended to diagnose MRSA infection nor to guide or monitor treatment for MRSA infections. Performed at [facility], [place] [address], [place], [place] [address]   SARS CORONAVIRUS 2 (TAT 6-24 HRS) Nasopharyngeal Nasopharyngeal Swab     Status: None   Collection Time: 08/13/19  3:25 AM   Specimen: Nasopharyngeal Swab  Result Value Ref Range Status   SARS Coronavirus 2 NEGATIVE NEGATIVE Final    Comment: (NOTE) SARS-CoV-2 target nucleic acids are NOT DETECTED. The SARS-CoV-2 RNA is generally detectable in upper and lower respiratory specimens during the acute phase of infection. Negative results do not preclude SARS-CoV-2 infection, do not rule out co-infections with other pathogens, and should not be used as the sole basis for treatment or other patient management decisions. Negative results must be combined with clinical observations, patient history, and epidemiological information. The expected result is Negative. Fact Sheet for Patients: SugarRoll.be Fact Sheet for Healthcare Providers: https://www.woods-mathews.com/ This test is not yet approved or cleared by the [place] FDA and  has been authorized for detection and/or diagnosis of SARS-CoV-2 by FDA under an Emergency Use Authorization (EUA). This EUA will remain  in effect (meaning this test can be used) for the duration of the COVID-19 declaration under Section 56 4(b)(1) of the Act, 21 U.S.C. section 360bbb-3(b)(1), unless the authorization is terminated or revoked sooner. Performed at [facility], [place] [address] [place], [place] [address]        Labs: Basic Metabolic Panel: Recent Labs  Lab 08/12/19 2209 08/13/19 0020 08/13/19 1133 08/14/19 0213  NA 124* 127* 127* 136  K 3.4* 2.9* 3.7 3.6  CL 88* 99 94* 104  CO2 21* 15* 22 22  GLUCOSE 150* 116* 97 91  BUN 11 9 7* 8  CREATININE 0.69 0.49 0.66 0.68  CALCIUM 8.7* 7.0* 8.5* 8.9   Liver Function Tests: Recent Labs  Lab 08/12/19 2209 08/13/19 0020 08/13/19 1133 08/14/19 0213  AST 27 20 20 20   ALT 15 10 10 10   ALKPHOS 51 38 41 38  BILITOT 1.1 0.9 1.0 1.1  PROT 7.0 4.9* 5.7* 5.1*  ALBUMIN 4.2 3.0* 3.5 3.3*   Recent Labs  Lab 08/12/19 2209 08/13/19 1133  LIPASE 305* 36   CBC: Recent Labs  Lab 08/12/19 2209 08/13/19 0716 08/14/19 0213  WBC 6.6 7.5 5.2  NEUTROABS 5.2  --   --   HGB 13.4 12.1 11.7*  HCT 39.5 35.8* 34.4*  MCV 95.9 95.0 95.6  PLT 265 256 238    Principal Problem:   Nausea and vomiting Active Problems:   Essential hypertension   Elevated lipase   Dilated cbd, acquired   Gallbladder hydrops   Hyponatremia   Aortic atherosclerosis (HCC)   Nausea & vomiting   Time coordinating discharge: 35 minutes  Signed:  Murray Hodgkins, MD  [facility]  08/14/2019, 2:53 PM

## 2019-08-14 NOTE — Plan of Care (Signed)
  Problem: Education: Goal: Knowledge of General Education information will improve Description: Including pain rating scale, medication(s)/side effects and non-pharmacologic comfort measures Outcome: Completed/Met   Problem: Health Behavior/Discharge Planning: Goal: Ability to manage health-related needs will improve Outcome: Completed/Met   Problem: Clinical Measurements: Goal: Ability to maintain clinical measurements within normal limits will improve Outcome: Completed/Met Goal: Will remain free from infection Outcome: Completed/Met Goal: Diagnostic test results will improve Outcome: Completed/Met Goal: Respiratory complications will improve Outcome: Completed/Met Goal: Cardiovascular complication will be avoided Description: Completed Outcome: Completed/Met   Problem: Activity: Goal: Risk for activity intolerance will decrease Outcome: Completed/Met   Problem: Nutrition: Goal: Adequate nutrition will be maintained Outcome: Completed/Met   Problem: Coping: Goal: Level of anxiety will decrease Outcome: Completed/Met   Problem: Elimination: Goal: Will not experience complications related to bowel motility Outcome: Completed/Met Goal: Will not experience complications related to urinary retention Outcome: Completed/Met   Problem: Pain Managment: Goal: General experience of comfort will improve Outcome: Completed/Met   Problem: Safety: Goal: Ability to remain free from injury will improve Outcome: Completed/Met   Problem: Skin Integrity: Goal: Risk for impaired skin integrity will decrease Outcome: Completed/Met

## 2019-08-17 ENCOUNTER — Encounter: Payer: Self-pay | Admitting: Certified Nurse Midwife

## 2019-08-17 DIAGNOSIS — D692 Other nonthrombocytopenic purpura: Secondary | ICD-10-CM | POA: Insufficient documentation

## 2019-10-19 DIAGNOSIS — W19XXXA Unspecified fall, initial encounter: Secondary | ICD-10-CM | POA: Insufficient documentation

## 2019-10-19 DIAGNOSIS — M545 Low back pain, unspecified: Secondary | ICD-10-CM | POA: Insufficient documentation

## 2019-10-19 DIAGNOSIS — M549 Dorsalgia, unspecified: Secondary | ICD-10-CM | POA: Insufficient documentation

## 2021-05-31 ENCOUNTER — Inpatient Hospital Stay (HOSPITAL_COMMUNITY)
Admission: EM | Admit: 2021-05-31 | Discharge: 2021-06-05 | DRG: 643 | Disposition: A | Discharge status: Skilled Nursing Facility | Payer: Medicare Other | Attending: Internal Medicine | Admitting: Internal Medicine

## 2021-05-31 ENCOUNTER — Encounter (HOSPITAL_COMMUNITY): Payer: Self-pay

## 2021-05-31 ENCOUNTER — Other Ambulatory Visit: Payer: Self-pay

## 2021-05-31 DIAGNOSIS — G8929 Other chronic pain: Secondary | ICD-10-CM | POA: Diagnosis present

## 2021-05-31 DIAGNOSIS — G928 Other toxic encephalopathy: Secondary | ICD-10-CM | POA: Diagnosis present

## 2021-05-31 DIAGNOSIS — Z85820 Personal history of malignant melanoma of skin: Secondary | ICD-10-CM

## 2021-05-31 DIAGNOSIS — Z79899 Other long term (current) drug therapy: Secondary | ICD-10-CM

## 2021-05-31 DIAGNOSIS — E871 Hypo-osmolality and hyponatremia: Secondary | ICD-10-CM

## 2021-05-31 DIAGNOSIS — N179 Acute kidney failure, unspecified: Secondary | ICD-10-CM | POA: Diagnosis present

## 2021-05-31 DIAGNOSIS — K219 Gastro-esophageal reflux disease without esophagitis: Secondary | ICD-10-CM | POA: Diagnosis present

## 2021-05-31 DIAGNOSIS — I160 Hypertensive urgency: Secondary | ICD-10-CM | POA: Diagnosis present

## 2021-05-31 DIAGNOSIS — M1990 Unspecified osteoarthritis, unspecified site: Secondary | ICD-10-CM | POA: Diagnosis present

## 2021-05-31 DIAGNOSIS — M419 Scoliosis, unspecified: Secondary | ICD-10-CM | POA: Diagnosis present

## 2021-05-31 DIAGNOSIS — E876 Hypokalemia: Secondary | ICD-10-CM | POA: Diagnosis present

## 2021-05-31 DIAGNOSIS — Z8249 Family history of ischemic heart disease and other diseases of the circulatory system: Secondary | ICD-10-CM

## 2021-05-31 DIAGNOSIS — M810 Age-related osteoporosis without current pathological fracture: Secondary | ICD-10-CM | POA: Diagnosis present

## 2021-05-31 DIAGNOSIS — I10 Essential (primary) hypertension: Secondary | ICD-10-CM | POA: Diagnosis present

## 2021-05-31 DIAGNOSIS — I700 Atherosclerosis of aorta: Secondary | ICD-10-CM | POA: Diagnosis present

## 2021-05-31 DIAGNOSIS — E222 Syndrome of inappropriate secretion of antidiuretic hormone: Secondary | ICD-10-CM | POA: Diagnosis not present

## 2021-05-31 DIAGNOSIS — I119 Hypertensive heart disease without heart failure: Secondary | ICD-10-CM | POA: Diagnosis present

## 2021-05-31 DIAGNOSIS — Z9181 History of falling: Secondary | ICD-10-CM

## 2021-05-31 DIAGNOSIS — R4182 Altered mental status, unspecified: Secondary | ICD-10-CM | POA: Diagnosis not present

## 2021-05-31 DIAGNOSIS — H9190 Unspecified hearing loss, unspecified ear: Secondary | ICD-10-CM | POA: Diagnosis present

## 2021-05-31 DIAGNOSIS — Z20822 Contact with and (suspected) exposure to covid-19: Secondary | ICD-10-CM | POA: Diagnosis present

## 2021-05-31 DIAGNOSIS — Z880 Allergy status to penicillin: Secondary | ICD-10-CM

## 2021-05-31 DIAGNOSIS — M549 Dorsalgia, unspecified: Secondary | ICD-10-CM | POA: Diagnosis present

## 2021-05-31 DIAGNOSIS — Z66 Do not resuscitate: Secondary | ICD-10-CM | POA: Diagnosis present

## 2021-05-31 DIAGNOSIS — E861 Hypovolemia: Secondary | ICD-10-CM | POA: Diagnosis present

## 2021-05-31 DIAGNOSIS — R54 Age-related physical debility: Secondary | ICD-10-CM | POA: Diagnosis present

## 2021-05-31 DIAGNOSIS — E860 Dehydration: Secondary | ICD-10-CM | POA: Diagnosis present

## 2021-05-31 DIAGNOSIS — Z887 Allergy status to serum and vaccine status: Secondary | ICD-10-CM

## 2021-05-31 DIAGNOSIS — J449 Chronic obstructive pulmonary disease, unspecified: Secondary | ICD-10-CM | POA: Diagnosis present

## 2021-05-31 NOTE — ED Triage Notes (Signed)
PER EMS: pt from [facility] staff reports she was hallucinating and talking to things that weren't there and acting confused. On arrival, pt is A&Ox3, person, place and situation but confused on the year, hard of hearing. Hx of frequent UTIs. BP-190/92, HR-80, O2-95% RA, CBG-116

## 2021-05-31 NOTE — ED Provider Notes (Signed)
[facility] DEPT Provider Note   CSN: [id number] Arrival date & time: 05/31/21  2330     History  Chief Complaint  Patient presents with   Altered Mental Status    Michele [name] is a 88 y.o. female.  HPI Patient is a 88 year old female with a history of UTI, cystocele, who presents to the emergency department from [facility] due to altered mental status.  They state that patient is typically A&O x3 but today has been hallucinating and talking to things that are not there.  On my exam patient A&O x3.  Hard of hearing.  Does endorse a history of UTIs but currently denies any complaints.  Denies any urinary complaints.    Home Medications Prior to Admission medications   Medication Sig Start Date End Date Taking? Authorizing Provider  acetaminophen (TYLENOL) 500 MG tablet Take 1,000 mg by mouth 3 (three) times daily.    [provider]  ALPRAZolam Duanne Moron) 0.25 MG tablet Take 0.25 mg by mouth 2 (two) times daily as needed. 06/11/17   [provider]  docusate sodium (COLACE) 100 MG capsule Take 1 capsule (100 mg total) by mouth 2 (two) times daily. 04/09/16   Barton Dubois, MD  feeding supplement (BOOST HIGH PROTEIN) LIQD Take 1 Container by mouth 2 (two) times daily between meals.    [provider]  losartan (COZAAR) 50 MG tablet Take 50 mg by mouth 2 (two) times daily.  10/13/14   [provider]  Melatonin 5 MG TABS Take 1 tablet by mouth at bedtime as needed.    [provider]  montelukast (SINGULAIR) 10 MG tablet Take 10 mg by mouth daily.  12/15/14   [provider]  Multiple Vitamin (MULTIVITAMIN WITH MINERALS) TABS Take 1 tablet by mouth daily.    [provider]  ondansetron (ZOFRAN) 4 MG tablet Take 4 mg by mouth every 8 (eight) hours as needed for nausea.    [provider]  oxyCODONE (OXY IR/ROXICODONE) 5 MG immediate release tablet Take 5 mg by mouth  every 6 (six) hours as needed for severe pain.    [provider]  pantoprazole (PROTONIX) 40 MG tablet Take 40 mg by mouth 2 (two) times daily before a meal.    [provider]  polyethylene glycol (MIRALAX) packet Take 17 g by mouth daily. Hold for diarrhea 04/09/16   Barton Dubois, MD  VITAMIN E PO Take 1 tablet by mouth daily.     [provider]      Allergies    Penicillins and Pneumococcal vaccines    Review of Systems   Review of Systems  All other systems reviewed and are negative. Ten systems reviewed and are negative for acute change, except as noted in the HPI.   Physical Exam Updated Vital Signs BP (!) 128/104    Pulse 73    Temp (!) 97.5 F (36.4 C) (Oral)    Resp 14    Ht 5\' 1"  (1.549 m)    Wt 48.1 kg    LMP 05/26/1997    SpO2 94%    BMI 20.03 kg/m  Physical Exam Vitals and nursing note reviewed.  Constitutional:      General: She is not in acute distress.    Appearance: Normal appearance. She is not ill-appearing, toxic-appearing or diaphoretic.  HENT:     Head: Normocephalic and atraumatic.     Right Ear: External ear normal.     Left Ear:  External ear normal.     Nose: Nose normal.     Mouth/Throat:     Mouth: Mucous membranes are moist.     Pharynx: Oropharynx is clear. No oropharyngeal exudate or posterior oropharyngeal erythema.  Eyes:     General: No scleral icterus.       Right eye: No discharge.        Left eye: No discharge.     Extraocular Movements: Extraocular movements intact.     Conjunctiva/sclera: Conjunctivae normal.  Cardiovascular:     Rate and Rhythm: Normal rate and regular rhythm.     Pulses: Normal pulses.     Heart sounds: Normal heart sounds. No murmur heard.   No friction rub. No gallop.  Pulmonary:     Effort: Pulmonary effort is normal. No respiratory distress.     Breath sounds: Normal breath sounds. No stridor. No wheezing, rhonchi or rales.  Abdominal:     General: Abdomen is flat.      Palpations: Abdomen is soft.     Tenderness: There is no abdominal tenderness.  Musculoskeletal:        General: Normal range of motion.     Cervical back: Normal range of motion and neck supple. No tenderness.  Skin:    General: Skin is warm and dry.  Neurological:     General: No focal deficit present.     Mental Status: She is alert and oriented to person, place, and time.     Comments: A&O x 3. Hard of hearing but providing clear answers to questions.  Psychiatric:        Mood and Affect: Mood normal.        Behavior: Behavior normal.   ED Results / Procedures / Treatments   Labs (all labs ordered are listed, but only abnormal results are displayed) Labs Reviewed  COMPREHENSIVE METABOLIC PANEL - Abnormal; Notable for the following components:      Result Value   Sodium 124 (*)    Potassium 3.2 (*)    Chloride 84 (*)    BUN 25 (*)    All other components within normal limits  CBC - Abnormal; Notable for the following components:   WBC 12.0 (*)    All other components within normal limits  URINALYSIS, ROUTINE W REFLEX MICROSCOPIC - Abnormal; Notable for the following components:   Hgb urine dipstick MODERATE (*)    Ketones, ur 20 (*)    Protein, ur 100 (*)    Bacteria, UA RARE (*)    All other components within normal limits  CBG MONITORING, ED - Abnormal; Notable for the following components:   Glucose-Capillary 100 (*)    All other components within normal limits  RESP PANEL BY RT-PCR (FLU A&B, COVID) [id number]  URINE CULTURE   EKG None  Radiology CT HEAD WO CONTRAST (5MM)  Result Date: 06/01/2021 CLINICAL DATA:  Delirium. EXAM: CT HEAD WITHOUT CONTRAST TECHNIQUE: Contiguous axial images were obtained from the base of the skull through the vertex without intravenous contrast. COMPARISON:  None. FINDINGS: Brain: No acute intracranial hemorrhage, midline shift or mass effect. No extra-axial fluid collection. There is diffuse atrophy. Extensive subcortical and  periventricular white matter hypodensities are present bilaterally. No hydrocephalus. Vascular: Atherosclerotic calcification of the carotid siphons and right vertebral artery with no hyperdense vessel. Skull: Normal. Negative for fracture or focal lesion. Sinuses/Orbits: Mucosal thickening is present in the ethmoid air cells and maxillary sinus on the left. The orbits are within normal limits. Other:  None. IMPRESSION: 1. No acute intracranial process. 2. Extensive subcortical and periventricular white matter hypodensities, compatible with chronic microvascular ischemic changes. Electronically Signed   By: Brett Fairy M.D.   On: 06/01/2021 00:51   DG Chest Portable 1 View  Result Date: 06/01/2021 CLINICAL DATA:  Altered mental status. EXAM: PORTABLE CHEST 1 VIEW COMPARISON:  Chest radiograph dated 08/12/2019 FINDINGS: No focal consolidation, pleural effusion, pneumothorax. Stable cardiomegaly. Moderate size hiatal hernia. Degenerative changes of the spine and scoliosis. No acute osseous pathology. IMPRESSION: No acute cardiopulmonary process. Electronically Signed   By: Anner Crete M.D.   On: 06/01/2021 00:32    Procedures Procedures   Medications Ordered in ED Medications  sodium chloride 0.9 % bolus 500 mL (500 mLs Intravenous New Bag/Given 06/01/21 0250)  potassium chloride SA (KLOR-CON M) CR tablet 40 mEq (40 mEq Oral Given 06/01/21 0248)    ED Course/ Medical Decision Making/ A&P Clinical Course as of 06/01/21 0255  Sat Jun 01, 2021  0123 Sodium(!): 124 [LJ]  0123 Potassium(!): 3.2 [LJ]  0124 Chloride(!): 84 [LJ]    Clinical Course User Index [LJ] Rayna Sexton, PA-C                           Medical Decision Making  Pt is a 88 y.o. female who presents to the emergency department from her nursing facility due to altered mental status.  Patient currently A&O x3.  Staff at her facility noted that she appeared to be hallucinating and speaking to people that were not  there.  Labs: CBC with a white count of 12. CMP with a sodium of 124, potassium of 3.2, chloride of 84, BUN of 25. UA with moderate hemoglobin, 20 ketones, 100 protein, rare bacteria. Urine culture obtained. Respiratory panel is negative.  Imaging: Chest x-ray shows no acute cardiopulmonary process. CT scan of the head without contrast shows no acute intracranial process.  Extensive subcortical and periventricular white matter hypodensities, compatible with chronic microvascular ischemic changes.  I, Rayna Sexton, PA-C, personally reviewed and evaluated these images and lab results as part of my medical decision-making.  CT scan of the head appears reassuring.  Patient currently A&O x3.  UA does not appear infectious.  Chest x-ray is clear.  Patient does have a leukocytosis of 12 on her CBC.  Respiratory panel is negative.  Patient does have a history of hyponatremia was found to be hyponatremic at 124.  Patient given 500 cc of normal saline.  UA with 20 ketones.  Possibly a degree of dehydration.  Potassium of 3.2.  This was repleted with Klor-Con.  Given patient's transient altered mental status today, age/comorbidities, as well as her acute on chronic hyponatremia, feel that patient will require admission for further management.  We will discuss with the medicine team.  Note: Portions of this report may have been transcribed using voice recognition software. Every effort was made to ensure accuracy; however, inadvertent computerized transcription errors may be present.   Final Clinical Impression(s) / ED Diagnoses Final diagnoses:  Altered mental status, unspecified altered mental status type  Hyponatremia   Rx / DC Orders ED Discharge Orders     None         Rayna Sexton, PA-C 06/01/21 0257    Quintella Reichert, MD 06/01/21 2326

## 2021-06-01 ENCOUNTER — Emergency Department (HOSPITAL_COMMUNITY): Payer: Medicare Other

## 2021-06-01 ENCOUNTER — Encounter (HOSPITAL_COMMUNITY): Payer: Self-pay | Admitting: Internal Medicine

## 2021-06-01 DIAGNOSIS — E871 Hypo-osmolality and hyponatremia: Secondary | ICD-10-CM | POA: Diagnosis not present

## 2021-06-01 LAB — RENAL FUNCTION PANEL
Albumin: 3.8 g/dL (ref 3.5–5.0)
Albumin: 4.1 g/dL (ref 3.5–5.0)
Anion gap: 15 (ref 5–15)
Anion gap: 11 (ref 5–15)
BUN: 21 mg/dL (ref 8–23)
BUN: 25 mg/dL — ABNORMAL HIGH (ref 8–23)
CO2: 22 mmol/L (ref 22–32)
CO2: 26 mmol/L (ref 22–32)
Calcium: 8.7 mg/dL — ABNORMAL LOW (ref 8.9–10.3)
Calcium: 9 mg/dL (ref 8.9–10.3)
Chloride: 87 mmol/L — ABNORMAL LOW (ref 98–111)
Chloride: 88 mmol/L — ABNORMAL LOW (ref 98–111)
Creatinine, Ser: 0.54 mg/dL (ref 0.44–1.00)
Creatinine, Ser: 0.76 mg/dL (ref 0.44–1.00)
GFR, Estimated: >60 mL/min (ref >60)
GFR, Estimated: >60 mL/min (ref >60)
Glucose, Bld: 67 mg/dL — ABNORMAL LOW (ref 70–99)
Glucose, Bld: 88 mg/dL (ref 70–99)
Phosphorus: 2.7 mg/dL (ref 2.5–4.6)
Phosphorus: 2.3 mg/dL — ABNORMAL LOW (ref 2.5–4.6)
Potassium: 3.4 mmol/L — ABNORMAL LOW (ref 3.5–5.1)
Potassium: 3.4 mmol/L — ABNORMAL LOW (ref 3.5–5.1)
Sodium: 124 mmol/L — ABNORMAL LOW (ref 135–145)
Sodium: 125 mmol/L — ABNORMAL LOW (ref 135–145)

## 2021-06-01 LAB — CBC
HCT: 40.1 % (ref 36.0–46.0)
Hemoglobin: 13.9 g/dL (ref 12.0–15.0)
MCH: 33 pg (ref 26.0–34.0)
MCHC: 34.7 g/dL (ref 30.0–36.0)
MCV: 95.2 fL (ref 80.0–100.0)
Platelets: 313 10*3/uL (ref 150–400)
RBC: 4.21 MIL/uL (ref 3.87–5.11)
RDW: 11.9 % (ref 11.5–15.5)
WBC: 12 10*3/uL — ABNORMAL HIGH (ref 4.0–10.5)
nRBC: 0 % (ref 0.0–0.2)

## 2021-06-01 LAB — CBC WITH DIFFERENTIAL/PLATELET
Abs Immature Granulocytes: 0.04 10*3/uL (ref 0.00–0.07)
Basophils Absolute: 0 10*3/uL (ref 0.0–0.1)
Basophils Relative: 0 %
Eosinophils Absolute: 0 10*3/uL (ref 0.0–0.5)
Eosinophils Relative: 0 %
HCT: 38.1 % (ref 36.0–46.0)
Hemoglobin: 13.3 g/dL (ref 12.0–15.0)
Immature Granulocytes: 0 %
Lymphocytes Relative: 8 %
Lymphs Abs: 1.1 10*3/uL (ref 0.7–4.0)
MCH: 33.3 pg (ref 26.0–34.0)
MCHC: 34.9 g/dL (ref 30.0–36.0)
MCV: 95.5 fL (ref 80.0–100.0)
Monocytes Absolute: 1.3 10*3/uL — ABNORMAL HIGH (ref 0.1–1.0)
Monocytes Relative: 10 %
Neutro Abs: 11.1 10*3/uL — ABNORMAL HIGH (ref 1.7–7.7)
Neutrophils Relative %: 82 %
Platelets: 292 10*3/uL (ref 150–400)
RBC: 3.99 MIL/uL (ref 3.87–5.11)
RDW: 11.9 % (ref 11.5–15.5)
WBC: 13.6 10*3/uL — ABNORMAL HIGH (ref 4.0–10.5)
nRBC: 0 % (ref 0.0–0.2)

## 2021-06-01 LAB — URINALYSIS, ROUTINE W REFLEX MICROSCOPIC
Bilirubin Urine: NEGATIVE
Glucose, UA: NEGATIVE mg/dL
Ketones, ur: 20 mg/dL — AB
Leukocytes,Ua: NEGATIVE
Nitrite: NEGATIVE
Protein, ur: 100 mg/dL — AB
Specific Gravity, Urine: 1.017 (ref 1.005–1.030)
pH: 5 (ref 5.0–8.0)

## 2021-06-01 LAB — RAPID URINE DRUG SCREEN, HOSP PERFORMED
Amphetamines: NOT DETECTED
Barbiturates: NOT DETECTED
Benzodiazepines: NOT DETECTED
Cocaine: NOT DETECTED
Opiates: NOT DETECTED
Tetrahydrocannabinol: NOT DETECTED

## 2021-06-01 LAB — COMPREHENSIVE METABOLIC PANEL
ALT: 18 U/L (ref 0–44)
ALT: 19 U/L (ref 0–44)
AST: 31 U/L (ref 15–41)
AST: 33 U/L (ref 15–41)
Albumin: 4.8 g/dL (ref 3.5–5.0)
Albumin: 4 g/dL (ref 3.5–5.0)
Alkaline Phosphatase: 58 U/L (ref 38–126)
Alkaline Phosphatase: 51 U/L (ref 38–126)
Anion gap: 14 (ref 5–15)
Anion gap: 9 (ref 5–15)
BUN: 25 mg/dL — ABNORMAL HIGH (ref 8–23)
BUN: 20 mg/dL (ref 8–23)
CO2: 26 mmol/L (ref 22–32)
CO2: 25 mmol/L (ref 22–32)
Calcium: 9.3 mg/dL (ref 8.9–10.3)
Calcium: 8.5 mg/dL — ABNORMAL LOW (ref 8.9–10.3)
Chloride: 84 mmol/L — ABNORMAL LOW (ref 98–111)
Chloride: 87 mmol/L — ABNORMAL LOW (ref 98–111)
Creatinine, Ser: 0.76 mg/dL (ref 0.44–1.00)
Creatinine, Ser: 0.62 mg/dL (ref 0.44–1.00)
GFR, Estimated: >60 mL/min (ref >60)
GFR, Estimated: >60 mL/min (ref >60)
Glucose, Bld: 94 mg/dL (ref 70–99)
Glucose, Bld: 81 mg/dL (ref 70–99)
Potassium: 3.2 mmol/L — ABNORMAL LOW (ref 3.5–5.1)
Potassium: 3.8 mmol/L (ref 3.5–5.1)
Sodium: 124 mmol/L — ABNORMAL LOW (ref 135–145)
Sodium: 121 mmol/L — ABNORMAL LOW (ref 135–145)
Total Bilirubin: 1.2 mg/dL (ref 0.3–1.2)
Total Bilirubin: 1.3 mg/dL — ABNORMAL HIGH (ref 0.3–1.2)
Total Protein: 7.5 g/dL (ref 6.5–8.1)
Total Protein: 6.8 g/dL (ref 6.5–8.1)

## 2021-06-01 LAB — SURGICAL PCR SCREEN
MRSA, PCR: NEGATIVE
Staphylococcus aureus: NEGATIVE

## 2021-06-01 LAB — CBG MONITORING, ED: Glucose-Capillary: 100 mg/dL — ABNORMAL HIGH (ref 70–99)

## 2021-06-01 LAB — RESP PANEL BY RT-PCR (FLU A&B, COVID) ARPGX2
Influenza A by PCR: NEGATIVE
Influenza B by PCR: NEGATIVE
SARS Coronavirus 2 by RT PCR: NEGATIVE

## 2021-06-01 LAB — OSMOLALITY, URINE: Osmolality, Ur: 390 mOsm/kg (ref 300–900)

## 2021-06-01 LAB — MAGNESIUM: Magnesium: 1.7 mg/dL (ref 1.7–2.4)

## 2021-06-01 LAB — SODIUM, URINE, RANDOM: Sodium, Ur: 47 mmol/L

## 2021-06-01 LAB — MRSA NEXT GEN BY PCR, NASAL: MRSA by PCR Next Gen: NOT DETECTED

## 2021-06-01 MED ORDER — MONTELUKAST SODIUM 10 MG PO TABS
10.0000 mg | ORAL_TABLET | Freq: Every day | ORAL | Status: DC
  Administered 2021-06-01 – 2021-06-04 (×4): 10 mg via ORAL
  Filled 2021-06-01 (×4): qty 1

## 2021-06-01 MED ORDER — SODIUM CHLORIDE 0.9 % IV SOLN: INTRAVENOUS | Status: DC

## 2021-06-01 MED ORDER — LORAZEPAM 2 MG/ML IJ SOLN
0.5000 mg | Freq: Four times a day (QID) | INTRAMUSCULAR | Status: DC | PRN
  Administered 2021-06-01: 0.5 mg via INTRAVENOUS
  Filled 2021-06-01: qty 1

## 2021-06-01 MED ORDER — ACETAMINOPHEN 650 MG RE SUPP: 650.0000 mg | Freq: Four times a day (QID) | RECTAL | Status: DC | PRN

## 2021-06-01 MED ORDER — SODIUM CHLORIDE 0.9 % IV BOLUS
500.0000 mL | Freq: Once | INTRAVENOUS | Status: AC
  Administered 2021-06-01: 500 mL via INTRAVENOUS

## 2021-06-01 MED ORDER — MELATONIN 5 MG PO TABS
5.0000 mg | ORAL_TABLET | Freq: Every evening | ORAL | Status: DC | PRN
  Filled 2021-06-01: qty 1

## 2021-06-01 MED ORDER — LOSARTAN POTASSIUM 50 MG PO TABS
50.0000 mg | ORAL_TABLET | Freq: Two times a day (BID) | ORAL | Status: DC
  Administered 2021-06-01 – 2021-06-05 (×8): 50 mg via ORAL
  Filled 2021-06-01 (×8): qty 1

## 2021-06-01 MED ORDER — POTASSIUM CHLORIDE CRYS ER 20 MEQ PO TBCR
40.0000 meq | EXTENDED_RELEASE_TABLET | Freq: Every day | ORAL | Status: DC
  Administered 2021-06-02: 40 meq via ORAL
  Filled 2021-06-01: qty 2

## 2021-06-01 MED ORDER — HYDROCODONE-ACETAMINOPHEN 5-325 MG PO TABS: 1.0000 | ORAL_TABLET | ORAL | Status: DC | PRN

## 2021-06-01 MED ORDER — ACETAMINOPHEN 325 MG PO TABS
650.0000 mg | ORAL_TABLET | Freq: Four times a day (QID) | ORAL | Status: DC | PRN
  Administered 2021-06-02 – 2021-06-05 (×7): 650 mg via ORAL
  Filled 2021-06-01 (×7): qty 2

## 2021-06-01 MED ORDER — PANTOPRAZOLE SODIUM 40 MG PO TBEC
40.0000 mg | DELAYED_RELEASE_TABLET | Freq: Two times a day (BID) | ORAL | Status: DC
  Administered 2021-06-01 – 2021-06-05 (×8): 40 mg via ORAL
  Filled 2021-06-01 (×8): qty 1

## 2021-06-01 MED ORDER — ADULT MULTIVITAMIN W/MINERALS CH
1.0000 | ORAL_TABLET | Freq: Every day | ORAL | Status: DC
  Administered 2021-06-02 – 2021-06-05 (×4): 1 via ORAL
  Filled 2021-06-01 (×5): qty 1

## 2021-06-01 MED ORDER — POTASSIUM CHLORIDE CRYS ER 20 MEQ PO TBCR
40.0000 meq | EXTENDED_RELEASE_TABLET | Freq: Once | ORAL | Status: AC
  Administered 2021-06-01: 40 meq via ORAL
  Filled 2021-06-01: qty 2

## 2021-06-01 MED ORDER — ENOXAPARIN SODIUM 30 MG/0.3ML IJ SOSY
30.0000 mg | PREFILLED_SYRINGE | INTRAMUSCULAR | Status: DC
  Administered 2021-06-01 – 2021-06-04 (×4): 30 mg via SUBCUTANEOUS
  Filled 2021-06-01 (×4): qty 0.3

## 2021-06-01 MED ORDER — ENSURE ENLIVE PO LIQD
237.0000 mL | Freq: Two times a day (BID) | ORAL | Status: DC
  Administered 2021-06-02 – 2021-06-04 (×6): 237 mL via ORAL

## 2021-06-01 MED ORDER — HYDRALAZINE HCL 20 MG/ML IJ SOLN
10.0000 mg | Freq: Three times a day (TID) | INTRAMUSCULAR | Status: DC | PRN
  Administered 2021-06-01 – 2021-06-03 (×3): 10 mg via INTRAVENOUS
  Filled 2021-06-01 (×3): qty 1

## 2021-06-01 NOTE — ED Notes (Signed)
Notified DR Marylyn Ishihara of patients blood pressure and behaviors (attempting to get out of bed, not easily redirected)

## 2021-06-01 NOTE — H&P (Signed)
History and Physical    Michele [name]:[id number] DOB: December 16, 1926 DOA: 05/31/2021  PCP: Burnard Bunting, MD  Patient coming from: [facility]  Chief Complaint: Altered mental status  HPI: Michele [name] is a 88 y.o. female with medical history significant of GERD, HTN, chronic back pain. Presenting with altered mental status. History is from son at bedside. He reports that the patient was in her normal state of health until yesterday. He received a call from her ALF that she was found on the floor and seemed confused. She didn't have a witnessed fall. She was found talking to invisible objects by the staff. This is a change from her usual status of being fully oriented. They became concerned that this may be a UTI, so they called for EMS to bring her to the ED for evaluation.    ED Course: Her UA was negative. Her Na+ was 124. Her K+ was 3.2. CTH was negative. She had a mild leukocytosis. [facility] was called for admission.   Review of Systems:  Unable to obtain d/t mentation.   PMHx Past Medical History:  Diagnosis Date   Aortic atherosclerosis ([place]) 08/13/2019   Asthma    COPD (chronic obstructive pulmonary disease) ([place])    Cystocele 08/2011   Diverticulosis    Fractured rib 2017   GERD (gastroesophageal reflux disease)    Hematuria    GU workup   Hiatal hernia    Hypertension    Melanoma ([place]) 11/2001   Osteoarthritis    Osteoporosis    Scoliosis    UTI (urinary tract infection)    septic    PSHx Past Surgical History:  Procedure Laterality Date   HYSTEROSCOPY  06/1998   D&C-Benign   MELANOMA EXCISION      SocHx  reports that she has never smoked. She has never used smokeless tobacco. She reports that she does not drink alcohol and does not use drugs.  Allergies  Allergen Reactions   Penicillins Other (See Comments)    Reaction unknown Has patient had a PCN reaction causing immediate rash, facial/tongue/throat swelling, SOB or lightheadedness with  hypotension: n/a Has patient had a PCN reaction causing severe rash involving mucus membranes or skin necrosis: n/a Has patient had a PCN reaction that required hospitalization: n/a Has patient had a PCN reaction occurring within the last 10 years: n/a If all of the above answers are "NO", then may proceed with Cephalosporin use.    Pneumococcal Vaccines Other (See Comments)    Reaction unknown    FamHx Family History  Problem Relation Age of Onset   Hypertension Father    Heart attack Father    Colon cancer Neg Hx     Prior to Admission medications   Medication Sig Start Date End Date Taking? Authorizing Provider  acetaminophen (TYLENOL) 500 MG tablet Take 1,000 mg by mouth 3 (three) times Michele.    [provider]  ALPRAZolam Duanne Moron) 0.25 MG tablet Take 0.25 mg by mouth 2 (two) times Michele as needed. 06/11/17   [provider]  docusate sodium (COLACE) 100 MG capsule Take 1 capsule (100 mg total) by mouth 2 (two) times Michele. 04/09/16   Barton Dubois, MD  feeding supplement (BOOST HIGH PROTEIN) LIQD Take 1 Container by mouth 2 (two) times Michele between meals.    [provider]  losartan (COZAAR) 50 MG tablet Take 50 mg by mouth 2 (two) times Michele.  10/13/14   [provider]  Melatonin 5 MG TABS  Take 1 tablet by mouth at bedtime as needed.    [provider]  montelukast (SINGULAIR) 10 MG tablet Take 10 mg by mouth Michele.  12/15/14   [provider]  Multiple Vitamin (MULTIVITAMIN WITH MINERALS) TABS Take 1 tablet by mouth Michele.    [provider]  ondansetron (ZOFRAN) 4 MG tablet Take 4 mg by mouth every 8 (eight) hours as needed for nausea.    [provider]  oxyCODONE (OXY IR/ROXICODONE) 5 MG immediate release tablet Take 5 mg by mouth every 6 (six) hours as needed for severe pain.    [provider]  pantoprazole (PROTONIX) 40 MG tablet Take 40 mg by mouth 2 (two) times Michele before a meal.     [provider]  polyethylene glycol (MIRALAX) packet Take 17 g by mouth Michele. Hold for diarrhea 04/09/16   Barton Dubois, MD  VITAMIN E PO Take 1 tablet by mouth Michele.     [provider]    Physical Exam: Vitals:   06/01/21 0145 06/01/21 0215 06/01/21 0315 06/01/21 0750  BP: (!) 181/100 (!) 128/104 (!) 172/93 (!) 169/116  Pulse: 79 73 77 79  Resp: 13 14 17 17   Temp:      TempSrc:      SpO2: 94% 94% 95% 96%  Weight:      Height:        General: 88 y.o. female resting in bed in NAD Eyes: PERRL, normal sclera ENMT: Nares patent w/o discharge, orophaynx clear, dentition normal, ears w/o discharge/lesions/ulcers Neck: Supple, trachea midline Cardiovascular: RRR, +S1, S2, no m/g/r, equal pulses throughout Respiratory: CTABL, no w/r/r, normal WOB GI: BS+, NDNT, no masses noted, no organomegaly noted MSK: No e/c/c Neuro: A&O x name/date, no focal deficits Psyc: agitated, but cooperative  Labs on Admission: I have personally reviewed following labs and imaging studies  CBC: Recent Labs  Lab 05/31/21 2345  WBC 12.0*  HGB 13.9  HCT 40.1  MCV 95.2  PLT 914   Basic Metabolic Panel: Recent Labs  Lab 05/31/21 2345  NA 124*  K 3.2*  CL 84*  CO2 26  GLUCOSE 94  BUN 25*  CREATININE 0.76  CALCIUM 9.3   GFR: Estimated Creatinine Clearance: 33.9 mL/min (by C-G formula based on SCr of 0.76 mg/dL). Liver Function Tests: Recent Labs  Lab 05/31/21 2345  AST 31  ALT 18  ALKPHOS 58  BILITOT 1.2  PROT 7.5  ALBUMIN 4.8   No results for input(s): LIPASE, AMYLASE in the last 168 hours. No results for input(s): AMMONIA in the last 168 hours. Coagulation Profile: No results for input(s): INR, PROTIME in the last 168 hours. Cardiac Enzymes: No results for input(s): CKTOTAL, CKMB, CKMBINDEX, TROPONINI in the last 168 hours. BNP (last 3 results) No results for input(s): PROBNP in the last 8760 hours. HbA1C: No results for input(s): HGBA1C in the last 72  hours. CBG: Recent Labs  Lab 05/31/21 2355  GLUCAP 100*   Lipid Profile: No results for input(s): CHOL, HDL, LDLCALC, TRIG, CHOLHDL, LDLDIRECT in the last 72 hours. Thyroid Function Tests: No results for input(s): TSH, T4TOTAL, FREET4, T3FREE, THYROIDAB in the last 72 hours. Anemia Panel: No results for input(s): VITAMINB12, FOLATE, FERRITIN, TIBC, IRON, RETICCTPCT in the last 72 hours. Urine analysis:    Component Value Date/Time   COLORURINE YELLOW 05/31/2021 2348   APPEARANCEUR CLEAR 05/31/2021 2348   LABSPEC 1.017 05/31/2021 2348   PHURINE 5.0 05/31/2021 2348   GLUCOSEU NEGATIVE 05/31/2021 2348  HGBUR MODERATE (A) 05/31/2021 2348   BILIRUBINUR NEGATIVE 05/31/2021 2348   BILIRUBINUR n 08/18/2012 1515   KETONESUR 20 (A) 05/31/2021 2348   PROTEINUR 100 (A) 05/31/2021 2348   UROBILINOGEN 0.2 10/13/2014 0334   NITRITE NEGATIVE 05/31/2021 2348   LEUKOCYTESUR NEGATIVE 05/31/2021 2348    Radiological Exams on Admission: CT HEAD WO CONTRAST (5MM)  Result Date: 06/01/2021 CLINICAL DATA:  Delirium. EXAM: CT HEAD WITHOUT CONTRAST TECHNIQUE: Contiguous axial images were obtained from the base of the skull through the vertex without intravenous contrast. COMPARISON:  None. FINDINGS: Brain: No acute intracranial hemorrhage, midline shift or mass effect. No extra-axial fluid collection. There is diffuse atrophy. Extensive subcortical and periventricular white matter hypodensities are present bilaterally. No hydrocephalus. Vascular: Atherosclerotic calcification of the carotid siphons and right vertebral artery with no hyperdense vessel. Skull: Normal. Negative for fracture or focal lesion. Sinuses/Orbits: Mucosal thickening is present in the ethmoid air cells and maxillary sinus on the left. The orbits are within normal limits. Other: None. IMPRESSION: 1. No acute intracranial process. 2. Extensive subcortical and periventricular white matter hypodensities, compatible with chronic microvascular  ischemic changes. Electronically Signed   By: Brett Fairy M.D.   On: 06/01/2021 00:51   DG Chest Portable 1 View  Result Date: 06/01/2021 CLINICAL DATA:  Altered mental status. EXAM: PORTABLE CHEST 1 VIEW COMPARISON:  Chest radiograph dated 08/12/2019 FINDINGS: No focal consolidation, pleural effusion, pneumothorax. Stable cardiomegaly. Moderate size hiatal hernia. Degenerative changes of the spine and scoliosis. No acute osseous pathology. IMPRESSION: No acute cardiopulmonary process. Electronically Signed   By: Anner Crete M.D.   On: 06/01/2021 00:32    EKG: None obtained in ED  Assessment/Plan Acute metabolic encephalopathy     - placed in obs, med surg     - UA is negative, COVID/flu are negative     - her son reports she may have had some cough earlier this week; her CXR is negative and she is not hypoxic     - can check a RVP; but it would be supportive care at most now anyway     - CTH is negative and she has no focality on exam     - it's possible the confusion is related to her hyponatremia; let's treat that and see how she responds     - also check UDS  Hyponatremia Hypokalemia     - not sure how acute this is; so will conservatively treat: limit Na+ correction to 8pts/day; q6h renal function panel     - IVF w/ NS     - check Uosm, UNa+     - replace K+, check Mg2+  HTN     - resume home meds when confirmed     - PRN hydralazine available  GERD     - PPI  DVT prophylaxis: lovenox  Code Status: DNR, confirmed w/ daughter  Family Communication: w/ son at bedside and daughter by phone  Consults called: None   Status is: Observation  The patient remains OBS appropriate and will d/c before 2 midnights.  Jonnie Finner DO [facility]  If 7PM-7AM, please contact night-coverage [email or web address]  06/01/2021, 8:07 AM

## 2021-06-01 NOTE — ED Notes (Signed)
Bedside cleaning done. Full linen change. Patient placed back on monitor. Daughter at bedside.

## 2021-06-01 NOTE — ED Notes (Signed)
This nurse spoke with daughter Sharyn Lull. Gave update on patient.

## 2021-06-01 NOTE — Plan of Care (Signed)
  Problem: Elimination: Goal: Will not experience complications related to urinary retention Outcome: Progressing   Problem: Safety: Goal: Ability to remain free from injury will improve Outcome: Progressing   

## 2021-06-01 NOTE — ED Notes (Signed)
Pt refuses vitals recheck. Pt states she knows her rights and is refusing it until she thinks about it.

## 2021-06-01 NOTE — Progress Notes (Signed)
Pt. Found sitting on the side of the bed after setting off the bed alarm insisting that she was told that she could go home today and she is waiting for her ride home.  Pt alert to self, birthdate that she is in [place] and that she lives at [facility]. Tried to contact Daughter [name] and left a voice message. Called [facility] and spoke with Ubaldo Glassing who gave addition number for other daughter Roselyn Reef. Unable to reach either.  Ubaldo Glassing stated that she would try to reach out to them as well. Explained to patient why she is here but doesn't remember why she is here. Patient agreed to sitting back in bed and waiting to talk with her family and doctor about going home tomorrow(Sunday 1/8)

## 2021-06-02 DIAGNOSIS — K219 Gastro-esophageal reflux disease without esophagitis: Secondary | ICD-10-CM | POA: Diagnosis present

## 2021-06-02 DIAGNOSIS — Z85820 Personal history of malignant melanoma of skin: Secondary | ICD-10-CM | POA: Diagnosis not present

## 2021-06-02 DIAGNOSIS — M419 Scoliosis, unspecified: Secondary | ICD-10-CM | POA: Diagnosis present

## 2021-06-02 DIAGNOSIS — Z79899 Other long term (current) drug therapy: Secondary | ICD-10-CM | POA: Diagnosis not present

## 2021-06-02 DIAGNOSIS — Z8249 Family history of ischemic heart disease and other diseases of the circulatory system: Secondary | ICD-10-CM | POA: Diagnosis not present

## 2021-06-02 DIAGNOSIS — J449 Chronic obstructive pulmonary disease, unspecified: Secondary | ICD-10-CM | POA: Diagnosis present

## 2021-06-02 DIAGNOSIS — E876 Hypokalemia: Secondary | ICD-10-CM | POA: Diagnosis present

## 2021-06-02 DIAGNOSIS — H9190 Unspecified hearing loss, unspecified ear: Secondary | ICD-10-CM | POA: Diagnosis present

## 2021-06-02 DIAGNOSIS — R4182 Altered mental status, unspecified: Secondary | ICD-10-CM | POA: Diagnosis present

## 2021-06-02 DIAGNOSIS — G928 Other toxic encephalopathy: Secondary | ICD-10-CM | POA: Diagnosis present

## 2021-06-02 DIAGNOSIS — M810 Age-related osteoporosis without current pathological fracture: Secondary | ICD-10-CM | POA: Diagnosis present

## 2021-06-02 DIAGNOSIS — R54 Age-related physical debility: Secondary | ICD-10-CM | POA: Diagnosis present

## 2021-06-02 DIAGNOSIS — G8929 Other chronic pain: Secondary | ICD-10-CM | POA: Diagnosis present

## 2021-06-02 DIAGNOSIS — Z880 Allergy status to penicillin: Secondary | ICD-10-CM | POA: Diagnosis not present

## 2021-06-02 DIAGNOSIS — I700 Atherosclerosis of aorta: Secondary | ICD-10-CM | POA: Diagnosis present

## 2021-06-02 DIAGNOSIS — N179 Acute kidney failure, unspecified: Secondary | ICD-10-CM | POA: Diagnosis present

## 2021-06-02 DIAGNOSIS — E222 Syndrome of inappropriate secretion of antidiuretic hormone: Secondary | ICD-10-CM | POA: Diagnosis present

## 2021-06-02 DIAGNOSIS — Z887 Allergy status to serum and vaccine status: Secondary | ICD-10-CM | POA: Diagnosis not present

## 2021-06-02 DIAGNOSIS — E861 Hypovolemia: Secondary | ICD-10-CM | POA: Diagnosis present

## 2021-06-02 DIAGNOSIS — M549 Dorsalgia, unspecified: Secondary | ICD-10-CM | POA: Diagnosis present

## 2021-06-02 DIAGNOSIS — Z66 Do not resuscitate: Secondary | ICD-10-CM | POA: Diagnosis present

## 2021-06-02 DIAGNOSIS — Z9181 History of falling: Secondary | ICD-10-CM | POA: Diagnosis not present

## 2021-06-02 DIAGNOSIS — M1990 Unspecified osteoarthritis, unspecified site: Secondary | ICD-10-CM | POA: Diagnosis present

## 2021-06-02 DIAGNOSIS — Z20822 Contact with and (suspected) exposure to covid-19: Secondary | ICD-10-CM | POA: Diagnosis present

## 2021-06-02 DIAGNOSIS — I119 Hypertensive heart disease without heart failure: Secondary | ICD-10-CM | POA: Diagnosis present

## 2021-06-02 DIAGNOSIS — E871 Hypo-osmolality and hyponatremia: Secondary | ICD-10-CM | POA: Diagnosis not present

## 2021-06-02 LAB — COMPREHENSIVE METABOLIC PANEL
ALT: 18 U/L (ref 0–44)
AST: 29 U/L (ref 15–41)
Albumin: 3.9 g/dL (ref 3.5–5.0)
Alkaline Phosphatase: 51 U/L (ref 38–126)
Anion gap: 9 (ref 5–15)
BUN: 24 mg/dL — ABNORMAL HIGH (ref 8–23)
CO2: 22 mmol/L (ref 22–32)
Calcium: 8.7 mg/dL — ABNORMAL LOW (ref 8.9–10.3)
Chloride: 93 mmol/L — ABNORMAL LOW (ref 98–111)
Creatinine, Ser: 0.63 mg/dL (ref 0.44–1.00)
GFR, Estimated: >60 mL/min (ref >60)
Glucose, Bld: 108 mg/dL — ABNORMAL HIGH (ref 70–99)
Potassium: 3.2 mmol/L — ABNORMAL LOW (ref 3.5–5.1)
Sodium: 124 mmol/L — ABNORMAL LOW (ref 135–145)
Total Bilirubin: 1.4 mg/dL — ABNORMAL HIGH (ref 0.3–1.2)
Total Protein: 6.6 g/dL (ref 6.5–8.1)

## 2021-06-02 LAB — CBC
HCT: 38.4 % (ref 36.0–46.0)
Hemoglobin: 13.4 g/dL (ref 12.0–15.0)
MCH: 33.7 pg (ref 26.0–34.0)
MCHC: 34.9 g/dL (ref 30.0–36.0)
MCV: 96.5 fL (ref 80.0–100.0)
Platelets: 303 10*3/uL (ref 150–400)
RBC: 3.98 MIL/uL (ref 3.87–5.11)
RDW: 11.9 % (ref 11.5–15.5)
WBC: 14.7 10*3/uL — ABNORMAL HIGH (ref 4.0–10.5)
nRBC: 0 % (ref 0.0–0.2)

## 2021-06-02 LAB — URINE CULTURE: Culture: NO GROWTH

## 2021-06-02 LAB — MAGNESIUM: Magnesium: 2.1 mg/dL (ref 1.7–2.4)

## 2021-06-02 MED ORDER — POTASSIUM CHLORIDE CRYS ER 20 MEQ PO TBCR: 40.0000 meq | EXTENDED_RELEASE_TABLET | Freq: Every day | ORAL | Status: DC

## 2021-06-02 MED ORDER — POTASSIUM CHLORIDE CRYS ER 20 MEQ PO TBCR
40.0000 meq | EXTENDED_RELEASE_TABLET | Freq: Two times a day (BID) | ORAL | Status: DC
  Administered 2021-06-02 – 2021-06-03 (×2): 40 meq via ORAL
  Filled 2021-06-02 (×2): qty 2

## 2021-06-02 NOTE — Progress Notes (Signed)
PROGRESS NOTE   KISMET FACEMIRE  LKJ:[id number] DOB: [birth year] DOA: 05/31/2021 PCP: Burnard Bunting, MD  Brief Narrative:  88 year old white female-prior ALF resident Possible gallbladder dysfunction Underlying chronic hyponatremia at baseline Cystocele with recurrent UTIs in the past Prior falls with right-sided fractures 2017 COPD?  Asthma  History obtained by son-patient was found on the floor seem confused unwitnessed fall-at baseline is completely coherent Felt there may be dealing with UTI  Sodium found to be 121 BUN/creatinine 20/0.6 (above baseline) LFTs normal White count 12 UA not concerning for infection, Sp Grav 1.017 Usod 47, Uosm 390 Portable CXR no acute disease CT head No acute intracranial process-extensive subcortical white matter hypodensities  Hospital-Problem based course  Acute toxic metabolic encephalopathy on admission likely secondary to hypovolemic hyponatremia Serum osmolality on admission 254-urine osmolality 390 urine sodium 47 Responding appropriately to IV saline so continue 75 cc/8 Recheck sodium and urine as well as osmolality in a.m. after infusing some more normal saline, rule out endocrine causes with TSH cortisol if still remains low ?  UTI on admission UA was negative--no need for antibiotics Mild AKI on admission Continue IV fluid as above Found down on floor No injury however will need therapy evaluation as lives at independent living for the past 5 years May need skilled level of care Mild hypokalemia Replace orally with K. Dur 40 daily Elevated blood pressure with hypertensive urgency Resume losartan 50 twice daily  DVT prophylaxis: Lovenox Code Status: DNR Family Communication: Discussed with daughter at the bedside Vella Redhead number [phone] Disposition:  Status is: Observation  The patient will require care spanning > 2 midnights and should be moved to inpatient because: Hyponatremic week      Consultants:     Procedures:   Antimicrobials:     Subjective  Awake coherent can tell me time date year and president Recognizes daughter at According to family however she still not back at her baseline  Objective: Vitals:   06/01/21 1700 06/01/21 2157 06/02/21 0713 06/02/21 0833  BP: (!) 160/120 (!) 156/117 (!) 181/100 (!) 148/83  Pulse: 62 86 84 82  Resp: 14 16 16 15   Temp: 98 F (36.7 C) 97.8 F (36.6 C) 98.2 F (36.8 C) 97.8 F (36.6 C)  TempSrc: Oral Oral Oral Oral  SpO2: 90% 98% 94% 94%  Weight:      Height:        Intake/Output Summary (Last 24 hours) at 06/02/2021 0929 Last data filed at 06/02/2021 0600 Gross per 24 hour  Intake 964 ml  Output 100 ml  Net 864 ml   Filed Weights   05/31/21 2343  Weight: 48.1 kg    Examination:  Awake coherent no distress EOMI NCAT no focal deficit moving all 4 limbs equally quite hard of hearing CTA B no rales no rhonchi No peripheral edema Z4-M2 holosystolic murmur noted Abdomen soft no rebound no guarding Power 5/5 moves all 4 limbs equally Psych euthymic  Data Reviewed: personally reviewed   CBC    Component Value Date/Time   WBC 14.7 (H) 06/02/2021 0855   RBC 3.98 06/02/2021 0855   HGB 13.4 06/02/2021 0855   HCT 38.4 06/02/2021 0855   PLT 303 06/02/2021 0855   MCV 96.5 06/02/2021 0855   MCH 33.7 06/02/2021 0855   MCHC 34.9 06/02/2021 0855   RDW 11.9 06/02/2021 0855   LYMPHSABS 1.1 06/01/2021 0941   MONOABS 1.3 (H) 06/01/2021 0941   EOSABS 0.0 06/01/2021 0941   BASOSABS 0.0 06/01/2021 0941  CMP Latest Ref Rng & Units 06/02/2021 06/01/2021 06/01/2021  Glucose 70 - 99 mg/dL 108(H) 88 67(L)  BUN 8 - 23 mg/dL 24(H) 25(H) 21  Creatinine 0.44 - 1.00 mg/dL 0.63 0.76 0.54  Sodium 135 - 145 mmol/L 124(L) 125(L) 124(L)  Potassium 3.5 - 5.1 mmol/L 3.2(L) 3.4(L) 3.4(L)  Chloride 98 - 111 mmol/L 93(L) 88(L) 87(L)  CO2 22 - 32 mmol/L 22 26 22   Calcium 8.9 - 10.3 mg/dL 8.7(L) 9.0 8.7(L)  Total Protein 6.5 - 8.1 g/dL 6.6 - -   Total Bilirubin 0.3 - 1.2 mg/dL 1.4(H) - -  Alkaline Phos 38 - 126 U/L 51 - -  AST 15 - 41 U/L 29 - -  ALT 0 - 44 U/L 18 - -     Radiology Studies: CT HEAD WO CONTRAST (5MM)  Result Date: 06/01/2021 CLINICAL DATA:  Delirium. EXAM: CT HEAD WITHOUT CONTRAST TECHNIQUE: Contiguous axial images were obtained from the base of the skull through the vertex without intravenous contrast. COMPARISON:  None. FINDINGS: Brain: No acute intracranial hemorrhage, midline shift or mass effect. No extra-axial fluid collection. There is diffuse atrophy. Extensive subcortical and periventricular white matter hypodensities are present bilaterally. No hydrocephalus. Vascular: Atherosclerotic calcification of the carotid siphons and right vertebral artery with no hyperdense vessel. Skull: Normal. Negative for fracture or focal lesion. Sinuses/Orbits: Mucosal thickening is present in the ethmoid air cells and maxillary sinus on the left. The orbits are within normal limits. Other: None. IMPRESSION: 1. No acute intracranial process. 2. Extensive subcortical and periventricular white matter hypodensities, compatible with chronic microvascular ischemic changes. Electronically Signed   By: Brett Fairy M.D.   On: 06/01/2021 00:51   DG Chest Portable 1 View  Result Date: 06/01/2021 CLINICAL DATA:  Altered mental status. EXAM: PORTABLE CHEST 1 VIEW COMPARISON:  Chest radiograph dated 08/12/2019 FINDINGS: No focal consolidation, pleural effusion, pneumothorax. Stable cardiomegaly. Moderate size hiatal hernia. Degenerative changes of the spine and scoliosis. No acute osseous pathology. IMPRESSION: No acute cardiopulmonary process. Electronically Signed   By: Anner Crete M.D.   On: 06/01/2021 00:32     Scheduled Meds:  enoxaparin (LOVENOX) injection  30 mg Subcutaneous Q24H   feeding supplement  237 mL Oral BID BM   losartan  50 mg Oral BID   montelukast  10 mg Oral Daily   multivitamin with minerals  1 tablet Oral Daily    pantoprazole  40 mg Oral BID AC   potassium chloride  40 mEq Oral Daily   Continuous Infusions:  sodium chloride 75 mL/hr at 06/01/21 1828     LOS: 0 days   Time spent: 109  Nita Sells, MD [facility] To contact the attending provider between 7A-7P or the covering provider during after hours 7P-7A, please log into the web site [email or web address] and access using universal [place] password for that web site. If you do not have the password, please call the hospital operator.  06/02/2021, 9:29 AM

## 2021-06-02 NOTE — Evaluation (Signed)
Physical Therapy Evaluation Patient Details Name: Michele [name] MRN: [id number] DOB: Apr 04, 1927 Today's Date: 06/02/2021  History of Present Illness  88 yo female admitted after unwittenessed fall. adm with acute toxic metabolic encephalopathy likely d/t  hypovolemic hyponatremia. PMH: falls, scoliosis,GERD, HTN, chronic back pain  Clinical Impression  Pt admitted with above diagnosis.  Pt is independent at baseline. May need SNF vs HHPT pending progress with mobility and cognitive improvement.  Pt is from [facility] at [facility]  Pt currently with functional limitations due to the deficits listed below (see PT Problem List). Pt will benefit from skilled PT to increase their independence and safety with mobility to allow discharge to the venue listed below.          Recommendations for follow up therapy are one component of a multi-disciplinary discharge planning process, led by the attending physician.  Recommendations may be updated based on patient status, additional functional criteria and insurance authorization.  Follow Up Recommendations Skilled nursing-short term rehab (<3 hours/day) (vs HHPT depending on progress)    Assistance Recommended at Discharge    Patient can return home with the following  A little help with walking and/or transfers;A little help with bathing/dressing/bathroom    Equipment Recommendations  (TBD, ?rollator)  Recommendations for Other Services       Functional Status Assessment Patient has had a recent decline in their functional status and demonstrates the ability to make significant improvements in function in a reasonable and predictable amount of time.     Precautions / Restrictions Precautions Precautions: Fall Restrictions Weight Bearing Restrictions: No      Mobility  Bed Mobility Overal bed mobility: Needs Assistance Bed Mobility: Supine to Sit     Supine to sit: Min assist;Mod assist     General bed mobility comments: incr time,  assist with trunk    Transfers Overall transfer level: Needs assistance Equipment used: Rolling walker (2 wheels) Transfers: Sit to/from Stand;Bed to chair/wheelchair/BSC Sit to Stand: Min assist           General transfer comment: repeated STS x2. cues for hand placement and overall safety. min assist for balance/to steady for pivot to chair    Ambulation/Gait               General Gait Details: too fatigued  Stairs            Wheelchair Mobility    Modified Rankin (Stroke Patients Only)       Balance Overall balance assessment: Needs assistance Sitting-balance support: Feet supported;No upper extremity supported Sitting balance-Leahy Scale: Fair     Standing balance support: During functional activity;Reliant on assistive device for balance Standing balance-Leahy Scale: Poor                               Pertinent Vitals/Pain Pain Assessment: Faces Faces Pain Scale: Hurts little more Pain Location: neck pain Pain Descriptors / Indicators: Grimacing;Sore Pain Intervention(s): Repositioned;Monitored during session    Home Living Family/patient expects to be discharged to:: Unsure                   Additional Comments: pt is from [facility]. may need SNF depending on progress    Prior Function Prior Level of Function : Independent/Modified Independent;Driving             Mobility Comments: independent per dtr, does not use AD, drives a little. amb to DR at [employer]  Hand Dominance        Extremity/Trunk Assessment   Upper Extremity Assessment Upper Extremity Assessment: Generalized weakness    Lower Extremity Assessment Lower Extremity Assessment: Generalized weakness    Cervical / Trunk Assessment Cervical / Trunk Assessment: Other exceptions Cervical / Trunk Exceptions: scoliosis  Communication   Communication: No difficulties  Cognition Arousal/Alertness: Awake/alert (very sleepy, arouses to  voice) Behavior During Therapy: WFL for tasks assessed/performed Overall Cognitive Status: Impaired/Different from baseline Area of Impairment: Orientation;Following commands                 Orientation Level: Disoriented to;Situation;Place     Following Commands: Follows one step commands with increased time                General Comments      Exercises     Assessment/Plan    PT Assessment Patient needs continued PT services  PT Problem List Decreased strength;Decreased mobility;Decreased activity tolerance;Decreased balance;Decreased cognition;Decreased knowledge of use of DME       PT Treatment Interventions DME instruction;Therapeutic activities;Gait training;Therapeutic exercise;Patient/family education;Functional mobility training;Balance training    PT Goals (Current goals can be found in the Care Plan section)  Acute Rehab PT Goals Patient Stated Goal: feel like myself again PT Goal Formulation: With patient Time For Goal Achievement: 06/16/21 Potential to Achieve Goals: Good    Frequency Min 3X/week     Co-evaluation               AM-PAC PT "6 Clicks" Mobility  Outcome Measure Help needed turning from your back to your side while in a flat bed without using bedrails?: A Little Help needed moving from lying on your back to sitting on the side of a flat bed without using bedrails?: A Little Help needed moving to and from a bed to a chair (including a wheelchair)?: A Little Help needed standing up from a chair using your arms (e.g., wheelchair or bedside chair)?: A Little Help needed to walk in hospital room?: A Lot Help needed climbing 3-5 steps with a railing? : Total 6 Click Score: 15    End of Session Equipment Utilized During Treatment: Gait belt Activity Tolerance: Patient tolerated treatment well Patient left: with call bell/phone within reach;in chair;with chair alarm set;with family/visitor present Nurse Communication: Mobility  status PT Visit Diagnosis: Other abnormalities of gait and mobility (R26.89);Muscle weakness (generalized) (M62.81);History of falling (Z91.81)    Time: 1950-9326 PT Time Calculation (min) (ACUTE ONLY): 20 min   Charges:   PT Evaluation $PT Eval Low Complexity: [place], PT  Acute Rehab Dept ([place]) [phone] Pager [phone]  06/02/2021   Ms [facility] 06/02/2021, 11:41 AM

## 2021-06-02 NOTE — Hospital Course (Signed)
88 year old white female-prior ALF resident Possible gallbladder dysfunction Underlying chronic hyponatremia at baseline Cystocele with recurrent UTIs in the past Prior falls with right-sided fractures 2017 COPD?  Asthma  History obtained by son-patient was found on the floor seem confused unwitnessed fall-at baseline is completely coherent Felt there may be dealing with UTI Sodium found to be 121 BUN/creatinine 20/0.6 (above baseline) LFTs normal White count 12 UA not concerning for infection, Sp Grav 1.017 Usod 47, Uosm 390 Portable CXR no acute disease CT headNo acute intracranial process-extensive subcortical white matter hypodensities

## 2021-06-02 NOTE — Plan of Care (Signed)
  Problem: Activity: Goal: Risk for activity intolerance will decrease Outcome: Progressing   Problem: Nutrition: Goal: Adequate nutrition will be maintained Outcome: Progressing   Problem: Coping: Goal: Level of anxiety will decrease Outcome: Progressing   

## 2021-06-02 NOTE — Plan of Care (Signed)
  Problem: Health Behavior/Discharge Planning: Goal: Ability to manage health-related needs will improve Outcome: Progressing   

## 2021-06-03 LAB — CBC WITH DIFFERENTIAL/PLATELET
Abs Immature Granulocytes: 0.06 10*3/uL (ref 0.00–0.07)
Basophils Absolute: 0 10*3/uL (ref 0.0–0.1)
Basophils Relative: 0 %
Eosinophils Absolute: 0.1 10*3/uL (ref 0.0–0.5)
Eosinophils Relative: 1 %
HCT: 38.3 % (ref 36.0–46.0)
Hemoglobin: 13.3 g/dL (ref 12.0–15.0)
Immature Granulocytes: 1 %
Lymphocytes Relative: 12 %
Lymphs Abs: 1.4 10*3/uL (ref 0.7–4.0)
MCH: 33.2 pg (ref 26.0–34.0)
MCHC: 34.7 g/dL (ref 30.0–36.0)
MCV: 95.5 fL (ref 80.0–100.0)
Monocytes Absolute: 1.8 10*3/uL — ABNORMAL HIGH (ref 0.1–1.0)
Monocytes Relative: 16 %
Neutro Abs: 8.4 10*3/uL — ABNORMAL HIGH (ref 1.7–7.7)
Neutrophils Relative %: 70 %
Platelets: 288 10*3/uL (ref 150–400)
RBC: 4.01 MIL/uL (ref 3.87–5.11)
RDW: 11.9 % (ref 11.5–15.5)
WBC: 11.8 10*3/uL — ABNORMAL HIGH (ref 4.0–10.5)
nRBC: 0 % (ref 0.0–0.2)

## 2021-06-03 LAB — BASIC METABOLIC PANEL
Anion gap: 6 (ref 5–15)
BUN: 19 mg/dL (ref 8–23)
CO2: 26 mmol/L (ref 22–32)
Calcium: 8.6 mg/dL — ABNORMAL LOW (ref 8.9–10.3)
Chloride: 98 mmol/L (ref 98–111)
Creatinine, Ser: 0.62 mg/dL (ref 0.44–1.00)
GFR, Estimated: >60 mL/min (ref >60)
Glucose, Bld: 95 mg/dL (ref 70–99)
Potassium: 4.3 mmol/L (ref 3.5–5.1)
Sodium: 130 mmol/L — ABNORMAL LOW (ref 135–145)

## 2021-06-03 LAB — OSMOLALITY, URINE: Osmolality, Ur: 231 mOsm/kg — ABNORMAL LOW (ref 300–900)

## 2021-06-03 LAB — SODIUM, URINE, RANDOM: Sodium, Ur: 63 mmol/L

## 2021-06-03 NOTE — TOC Initial Note (Signed)
Transition of Care [facility]) - Initial/Assessment Note   Patient Details  Name: Michele [name] MRN: [id number] Date of Birth: 11-21-1926  [facility] CM/SW Contact:    Sherie Don, LCSW Phone Number: 06/03/2021, 1:16 PM  Clinical Narrative: CSW spoke with patient's daughter, Caro Laroche, regarding SNF vs. HHPT. Per daughter, she would prefer that patient go to [facility] SNF for a few days before returning home to her [place] apartment. Rehab is a covered benefit at [place], so TOC will not need to get insurance authorization for SNF. Daughter is agreeable to transporting patient back to [facility].  CSW spoke with Butch Penny in admissions at [facility] and confirmed patient can come tomorrow pending a signed FL2, discharge summary, and negative COVID test on the day of discharge.  FL2 done; PASRR received. Initial referral sent to [facility]. TOC to follow.  Expected Discharge Plan: Skilled Nursing Facility Barriers to Discharge: Continued Medical Work up  Patient Goals and CMS Choice Patient states their goals for this hospitalization and ongoing recovery are:: Return to [place] for short-term rehab CMS Medicare.gov Compare Post Acute Care list provided to:: Patient Represenative (must comment) Caro Laroche (daughter)) Choice offered to / list presented to : Adult Children  Expected Discharge Plan and Services Expected Discharge Plan: [place] In-house Referral: Clinical Social Work Post Acute Care Choice: [place] Living arrangements for the past 2 months: [place]           DME Arranged: N/A DME Agency: NA  Prior Living Arrangements/Services Living arrangements for the past 2 months: [place] Lives with:: Facility Resident Patient language and need for interpreter reviewed:: Yes Do you feel safe going back to the place where you live?: Yes      Need for Family Participation in Patient Care: Yes  (Comment) Care giver support system in place?: Yes (comment) Criminal Activity/Legal Involvement Pertinent to Current Situation/Hospitalization: No - Comment as needed  Activities of Daily Living Home Assistive Devices/Equipment: Eyeglasses, Hearing aid ADL Screening (condition at time of admission) Patient's cognitive ability adequate to safely complete daily activities?: Yes Is the patient deaf or have difficulty hearing?: Yes Does the patient have difficulty seeing, even when wearing glasses/contacts?: No Does the patient have difficulty concentrating, remembering, or making decisions?: No Patient able to express need for assistance with ADLs?: Yes Does the patient have difficulty dressing or bathing?: Yes Independently performs ADLs?: No Communication: Independent Dressing (OT): Needs assistance Is this a change from baseline?: Change from baseline, expected to last >3 days Grooming: Independent Feeding: Needs assistance Is this a change from baseline?: Change from baseline, expected to last >3 days Bathing: Needs assistance Is this a change from baseline?: Change from baseline, expected to last >3 days Toileting: Needs assistance Is this a change from baseline?: Change from baseline, expected to last >3days In/Out Bed: Needs assistance Is this a change from baseline?: Change from baseline, expected to last >3 days Walks in Home: Needs assistance Is this a change from baseline?: Change from baseline, expected to last >3 days Does the patient have difficulty walking or climbing stairs?: Yes Weakness of Legs: Both Weakness of Arms/Hands: None  Permission Sought/Granted Permission sought to share information with : Facility Art therapist granted to share information with : Yes, Verbal Permission Granted Permission granted to share info w AGENCY: [facility]  Emotional Assessment Appearance:: Appears stated age Orientation: : Oriented to Self, Oriented to  Place, Oriented to Situation Alcohol / Substance Use: Not  Applicable Psych Involvement: No (comment)  Admission diagnosis:  Hyponatremia [E87.1] Altered mental status, unspecified altered mental status type [R41.82] Patient Active Problem List   Diagnosis Date Noted   Nausea and vomiting 08/13/2019   Elevated lipase 08/13/2019   Dilated cbd, acquired 08/13/2019   Gallbladder hydrops 08/13/2019   Hyponatremia 08/13/2019   Aortic atherosclerosis ([place]) 08/13/2019   Nausea & vomiting 08/13/2019   Constipation 04/28/2016   GERD (gastroesophageal reflux disease) 04/28/2016   Multiple closed fractures of ribs of right side    Hemothorax on right    Leukocytosis    Ribs, multiple fractures 04/08/2016   Cystocele 08/18/2012   Sepsis secondary to UTI ([place]) 04/27/2012   E coli bacteremia 04/27/2012   Elevation of cardiac enzymes 04/27/2012   Essential hypertension 04/27/2012   Asthma 04/27/2012   Nausea 04/27/2012   PCP:  Burnard Bunting, MD Pharmacy:   [id number] - [place], [place] [address] [place] [address] Phone: [phone] Fax: [phone]  Readmission Risk Interventions No flowsheet data found.

## 2021-06-03 NOTE — Progress Notes (Signed)
PROGRESS NOTE   Michele [name]  XTG:[id number] DOB: 12-19-1926 DOA: 05/31/2021 PCP: Burnard Bunting, MD  Brief Narrative:  88 year old white female-prior ALF resident Possible gallbladder dysfunction Underlying chronic hyponatremia at baseline Cystocele with recurrent UTIs in the past Prior falls with right-sided fractures 2017 COPD?  Asthma  History obtained by son-patient was found on the floor seem confused unwitnessed fall-at baseline is completely coherent Felt there may be dealing with UTI  Sodium found to be 121 BUN/creatinine 20/0.6 (above baseline) LFTs normal White count 12 UA not concerning for infection, Sp Grav 1.017 Usod 47, Uosm 390 Portable CXR no acute disease CT head No acute intracranial process-extensive subcortical white matter hypodensities  Hospital-Problem based course  Acute toxic metabolic encephalopathy on admission likely secondary to hypovolemic hyponatremia Serum osmolality on admission 254-and increased to 290  probably has some component of SIADH secondary to urine OSM's being lower today Responding appropriately to IV saline-- discontinue IV fluid-- will slightly fluid restrict 1500 cc only Patient has been counseled to take in more solute than liquids ?  UTI on admission UA was negative--no need for antibiotics Mild AKI on admission Continue IV fluid as above--AKI is resolved Found down on floor No injury however will need therapy evaluation as lives at independent living for the past 5 years May need skilled level of care based on therapy eval's Mild hypokalemia Given earlier K. Dur 40  Stop replacement as has resolved Elevated blood pressure with hypertensive urgency Resume losartan 50 twice daily Consider addition of medication in the outpatient setting  DVT prophylaxis: Lovenox Code Status: DNR Family Communication: Discussed with daughter at the bedside  Disposition:  Status is: Observation  The patient will require care  spanning > 2 midnights and should be moved to inpatient because: Hyponatremic week      Consultants:    Procedures:   Antimicrobials:     Subjective  Coherent awake alert no distress No chest pain no fever Very hard of hearing She says she has a sore throat but otherwise is well  Objective: Vitals:   06/02/21 0833 06/02/21 1428 06/02/21 2154 06/03/21 0552  BP: (!) 148/83 124/78 (!) 171/93 (!) 157/98  Pulse: 82 84 79 87  Resp: 15 17 16 16   Temp: 97.8 F (36.6 C) (!) 97.4 F (36.3 C) (!) 97.5 F (36.4 C) 98.5 F (36.9 C)  TempSrc: Oral Oral Oral Oral  SpO2: 94% 100% 95% 93%  Weight:      Height:        Intake/Output Summary (Last 24 hours) at 06/03/2021 1013 Last data filed at 06/03/2021 0600 Gross per 24 hour  Intake 1739.78 ml  Output 2400 ml  Net -660.22 ml    Filed Weights   05/31/21 2343  Weight: 48.1 kg    Examination:  Pleasant coherent can tell me date time year Can tell me place EOMI NCAT frail white female no distress Chest clear no rales rhonchi S1-S2 no murmur Abdomen slightly distended No lower extremity edema  Data Reviewed: personally reviewed   CBC    Component Value Date/Time   WBC 11.8 (H) 06/03/2021 0753   RBC 4.01 06/03/2021 0753   HGB 13.3 06/03/2021 0753   HCT 38.3 06/03/2021 0753   PLT 288 06/03/2021 0753   MCV 95.5 06/03/2021 0753   MCH 33.2 06/03/2021 0753   MCHC 34.7 06/03/2021 0753   RDW 11.9 06/03/2021 0753   LYMPHSABS 1.4 06/03/2021 0753   MONOABS 1.8 (H) 06/03/2021 0753   EOSABS 0.1  06/03/2021 0753   BASOSABS 0.0 06/03/2021 0753   CMP Latest Ref Rng & Units 06/03/2021 06/02/2021 06/01/2021  Glucose 70 - 99 mg/dL 95 108(H) 88  BUN 8 - 23 mg/dL 19 24(H) 25(H)  Creatinine 0.44 - 1.00 mg/dL 0.62 0.63 0.76  Sodium 135 - 145 mmol/L 130(L) 124(L) 125(L)  Potassium 3.5 - 5.1 mmol/L 4.3 3.2(L) 3.4(L)  Chloride 98 - 111 mmol/L 98 93(L) 88(L)  CO2 22 - 32 mmol/L 26 22 26   Calcium 8.9 - 10.3 mg/dL 8.6(L) 8.7(L) 9.0  Total  Protein 6.5 - 8.1 g/dL - 6.6 -  Total Bilirubin 0.3 - 1.2 mg/dL - 1.4(H) -  Alkaline Phos 38 - 126 U/L - 51 -  AST 15 - 41 U/L - 29 -  ALT 0 - 44 U/L - 18 -     Radiology Studies: No results found.   Scheduled Meds:  enoxaparin (LOVENOX) injection  30 mg Subcutaneous Q24H   feeding supplement  237 mL Oral BID BM   losartan  50 mg Oral BID   montelukast  10 mg Oral Daily   multivitamin with minerals  1 tablet Oral Daily   pantoprazole  40 mg Oral BID AC   potassium chloride  40 mEq Oral BID   Continuous Infusions:  sodium chloride 75 mL/hr at 06/02/21 1825     LOS: 1 day   Time spent: [place], MD [facility] To contact the attending provider between 7A-7P or the covering provider during after hours 7P-7A, please log into the web site [email or web address] and access using universal [place] password for that web site. If you do not have the password, please call the hospital operator.  06/03/2021, 10:13 AM

## 2021-06-03 NOTE — Progress Notes (Addendum)
Physical Therapy Treatment Patient Details Name: Michele [name] MRN: [id number] DOB: 11/15/1926 Today's Date: 06/03/2021   History of Present Illness 88 yo female admitted after unwittenessed fall. adm with acute toxic metabolic encephalopathy likely d/t  hypovolemic hyponatremia. PMH: falls, scoliosis,GERD, HTN, chronic back pain    PT Comments    Pt progressing toward goals. Incr activity tolerance and cognition improving. Should be able to d/c with HHPT  at [place]. Continue to follow; update-per discussion TOC family prefers pt to d/c to SNF at [facility]   Recommendations for follow up therapy are one component of a multi-disciplinary discharge planning process, led by the attending physician.  Recommendations may be updated based on patient status, additional functional criteria and insurance authorization.  Follow Up Recommendations  SNF (per family)     Assistance Recommended at Discharge Intermittent Supervision/Assistance  Patient can return home with the following A little help with walking and/or transfers;A little help with bathing/dressing/bathroom;Assist for transportation   Equipment Recommendations  Rolling walker (2 wheels)    Recommendations for Other Services       Precautions / Restrictions Precautions Precautions: Fall Restrictions Weight Bearing Restrictions: No     Mobility  Bed Mobility Overal bed mobility: Needs Assistance Bed Mobility: Supine to Sit     Supine to sit: Min assist     General bed mobility comments: incr time, assist with trunk    Transfers Overall transfer level: Needs assistance Equipment used: Rolling walker (2 wheels) Transfers: Sit to/from Stand Sit to Stand: Min assist;Min guard           General transfer comment: cues for hand placement, to step back to chair prior to desecent    Ambulation/Gait Ambulation/Gait assistance: Min guard Gait Distance (Feet): 100 Feet Assistive device: Rolling walker (2  wheels) Gait Pattern/deviations: Step-through pattern;Decreased stride length       General Gait Details: cues for RW position, posture, safety with turns   Stairs             Wheelchair Mobility    Modified Rankin (Stroke Patients Only)       Balance   Sitting-balance support: Feet supported;No upper extremity supported Sitting balance-Leahy Scale: Good     Standing balance support: During functional activity;Reliant on assistive device for balance Standing balance-Leahy Scale: Fair                              Cognition Arousal/Alertness: Awake/alert Behavior During Therapy: WFL for tasks assessed/performed Overall Cognitive Status: Impaired/Different from baseline                   Orientation Level: Disoriented to;Situation     Following Commands: Follows one step commands consistently;Follows multi-step commands with increased time       General Comments: overall improved mentation from yesterday        Exercises      General Comments        Pertinent Vitals/Pain Pain Assessment: Faces Faces Pain Scale: Hurts little more Pain Location: neck Pain Descriptors / Indicators: Grimacing;Sore Pain Intervention(s): Limited activity within patient's tolerance;Monitored during session;Repositioned;Heat applied    Home Living                          Prior Function            PT Goals (current goals can now be found in the care plan section) Acute Rehab  PT Goals Patient Stated Goal: feel like myself again PT Goal Formulation: With patient Time For Goal Achievement: 06/16/21 Potential to Achieve Goals: Good Progress towards PT goals: Progressing toward goals    Frequency    Min 3X/week      PT Plan Discharge plan needs to be updated    Co-evaluation              AM-PAC PT "6 Clicks" Mobility   Outcome Measure  Help needed turning from your back to your side while in a flat bed without using  bedrails?: A Little Help needed moving from lying on your back to sitting on the side of a flat bed without using bedrails?: A Little Help needed moving to and from a bed to a chair (including a wheelchair)?: A Little Help needed standing up from a chair using your arms (e.g., wheelchair or bedside chair)?: A Little Help needed to walk in hospital room?: A Little Help needed climbing 3-5 steps with a railing? : A Little 6 Click Score: 18    End of Session Equipment Utilized During Treatment: Gait belt Activity Tolerance: Patient tolerated treatment well Patient left: with call bell/phone within reach;in chair;with chair alarm set;with family/visitor present Nurse Communication: Mobility status PT Visit Diagnosis: Other abnormalities of gait and mobility (R26.89);Muscle weakness (generalized) (M62.81);History of falling (Z91.81)     Time: 7628-3151 PT Time Calculation (min) (ACUTE ONLY): 27 min  Charges:  $Gait Training: 23-37 mins                     Baxter Flattery, PT  Acute Rehab Dept ([place]) [phone] Pager [phone]  06/03/2021    [facility] 06/03/2021, 10:58 AM

## 2021-06-03 NOTE — NC FL2 (Signed)
[place] LEVEL OF CARE SCREENING TOOL     IDENTIFICATION  Patient Name: Michele [name] Birthdate: 1926/10/21 Sex: female Admission Date (Current Location): 05/31/2021  [facility] and [place] Number:  Herbalist and Address:  [facility],  [place], [place]      Provider Number: [id number]  Attending Physician Name and Address:  Nita Sells, MD  Relative Name and Phone Number:  Caro Laroche (daughter) Ph: [phone]    Current Level of Care: Hospital Recommended Level of Care: [place] Prior Approval Number:    Date Approved/Denied:   PASRR Number: [id number]  Discharge Plan: SNF    Current Diagnoses: Patient Active Problem List   Diagnosis Date Noted   Nausea and vomiting 08/13/2019   Elevated lipase 08/13/2019   Dilated cbd, acquired 08/13/2019   Gallbladder hydrops 08/13/2019   Hyponatremia 08/13/2019   Aortic atherosclerosis ([place]) 08/13/2019   Nausea & vomiting 08/13/2019   Constipation 04/28/2016   GERD (gastroesophageal reflux disease) 04/28/2016   Multiple closed fractures of ribs of right side    Hemothorax on right    Leukocytosis    Ribs, multiple fractures 04/08/2016   Cystocele 08/18/2012   Sepsis secondary to UTI ([place]) 04/27/2012   E coli bacteremia 04/27/2012   Elevation of cardiac enzymes 04/27/2012   Essential hypertension 04/27/2012   Asthma 04/27/2012   Nausea 04/27/2012    Orientation RESPIRATION BLADDER Height & Weight     Self, Situation, Place  Normal Incontinent Weight: 106 lb (48.1 kg) Height:  5\' 1"  (154.9 cm)  BEHAVIORAL SYMPTOMS/MOOD NEUROLOGICAL BOWEL NUTRITION STATUS   (N/A)   Continent Diet (Regular diet)  AMBULATORY STATUS COMMUNICATION OF NEEDS Skin   Limited Assist Verbally Other (Comment) (Ecchymosis: bilateral arms, legs)                       Personal Care Assistance Level of Assistance  Bathing, Feeding, Dressing Bathing  Assistance: Limited assistance Feeding assistance: Independent Dressing Assistance: Limited assistance     Functional Limitations Info  Sight, Speech, Hearing Sight Info: Impaired Hearing Info: Impaired Speech Info: Adequate    SPECIAL CARE FACTORS FREQUENCY  PT (By licensed PT), OT (By licensed OT)     PT Frequency: 5x's/week OT Frequency: 5x's/week            Contractures Contractures Info: Not present    Additional Factors Info  Code Status, Allergies, Psychotropic Code Status Info: DNR Allergies Info: Penicillins, Pneumococcal Vaccines Psychotropic Info: Ativan         Current Medications (06/03/2021):  This is the current hospital active medication list Current Facility-Administered Medications  Medication Dose Route Frequency Provider Last Rate Last Admin   acetaminophen (TYLENOL) tablet 650 mg  650 mg Oral Q6H PRN Marylyn Ishihara, Tyrone A, DO   650 mg at 06/03/21 1019   Or   acetaminophen (TYLENOL) suppository 650 mg  650 mg Rectal Q6H PRN Marylyn Ishihara, Tyrone A, DO       enoxaparin (LOVENOX) injection 30 mg  30 mg Subcutaneous Q24H Kyle, Tyrone A, DO   30 mg at 06/03/21 1020   feeding supplement (ENSURE ENLIVE / ENSURE PLUS) liquid 237 mL  237 mL Oral BID BM Kyle, Tyrone A, DO   237 mL at 06/03/21 1022   hydrALAZINE (APRESOLINE) injection 10 mg  10 mg Intravenous Q8H PRN Kyle, Tyrone A, DO   10 mg at 06/02/21 0720   HYDROcodone-acetaminophen (NORCO/VICODIN) 5-325 MG per tablet  1-2 tablet  1-2 tablet Oral Q4H PRN Marylyn Ishihara, Tyrone A, DO       losartan (COZAAR) tablet 50 mg  50 mg Oral BID Marylyn Ishihara, Tyrone A, DO   50 mg at 06/03/21 1019   melatonin tablet 5 mg  5 mg Oral QHS PRN Marylyn Ishihara, Tyrone A, DO       montelukast (SINGULAIR) tablet 10 mg  10 mg Oral Daily Kyle, Tyrone A, DO   10 mg at 06/02/21 2219   multivitamin with minerals tablet 1 tablet  1 tablet Oral Daily Kyle, Tyrone A, DO   1 tablet at 06/03/21 1020   pantoprazole (PROTONIX) EC tablet 40 mg  40 mg Oral BID AC Kyle, Tyrone A, DO    40 mg at 06/03/21 1019     Discharge Medications: Please see discharge summary for a list of discharge medications.  Relevant Imaging Results:  Relevant Lab Results:   Additional Information SSN: [id number]  Sherie Don, LCSW

## 2021-06-04 LAB — BASIC METABOLIC PANEL
Anion gap: 6 (ref 5–15)
BUN: 22 mg/dL (ref 8–23)
CO2: 26 mmol/L (ref 22–32)
Calcium: 8.7 mg/dL — ABNORMAL LOW (ref 8.9–10.3)
Chloride: 95 mmol/L — ABNORMAL LOW (ref 98–111)
Creatinine, Ser: 0.72 mg/dL (ref 0.44–1.00)
GFR, Estimated: >60 mL/min (ref >60)
Glucose, Bld: 101 mg/dL — ABNORMAL HIGH (ref 70–99)
Potassium: 4.3 mmol/L (ref 3.5–5.1)
Sodium: 127 mmol/L — ABNORMAL LOW (ref 135–145)

## 2021-06-04 MED ORDER — SODIUM CHLORIDE 1 G PO TABS
2.0000 g | ORAL_TABLET | Freq: Two times a day (BID) | ORAL | Status: DC
  Administered 2021-06-04 – 2021-06-05 (×2): 2 g via ORAL
  Filled 2021-06-04 (×2): qty 2

## 2021-06-04 NOTE — Progress Notes (Signed)
PROGRESS NOTE   Michele [name]  PZW:[id number] DOB: 16-Mar-1927 DOA: 05/31/2021 PCP: Burnard Bunting, MD  Brief Narrative:  88 year old white female-prior ALF resident Possible gallbladder dysfunction Underlying chronic hyponatremia at baseline Cystocele with recurrent UTIs in the past Prior falls with right-sided fractures 2017 COPD?  Asthma  History obtained by son-patient was found on the floor seem confused unwitnessed fall-at baseline is completely coherent Felt there may be dealing with UTI  Sodium found to be 121 BUN/creatinine 20/0.6 (above baseline) LFTs normal White count 12 UA not concerning for infection, Sp Grav 1.017 Usod 47, Uosm 390 Portable CXR no acute disease CT head No acute intracranial process-extensive subcortical white matter hypodensities  Hospital-Problem based course  Acute toxic metabolic encephalopathy on admission likely secondary to hypovolemic hyponatremia Serum osmolality on admission 254-and increased to 290  probably has some component of SIADH secondary to urine OSM's being lower today Patient only eating 25% of her meals Ensure-I have encouraged her to take in more of the diet and a regular diet Call placed to nephrology Dr. Jonnie Finner  to discuss urine studies-they recommended 1000 cc restriction however because of her frailty and age and risk for dehydration placed on 1200 cc, patient will be started on 2 g twice daily salt tabs and we will repeat labs in the morning- if she improves, recommend that discharge the patient on 1 g twice daily of salt tabs nephrology is available for formal consult if no better by tomorrow ?  UTI on admission UA was negative--no need for antibiotics Mild AKI on admission Continue IV fluid as above--AKI is resolved Found down on floor No injury however will need therapy evaluation as lives at independent living for the past 5 years May need skilled level of care based on therapy eval's Mild hypokalemia Given  earlier K. Dur 40  Stop replacement as has resolved Elevated blood pressure with hypertensive urgency Resume losartan 50 twice daily Consider addition of medication in the outpatient setting  DVT prophylaxis: Lovenox Code Status: DNR Family Communication: Discussed with daughter at the bedside  Disposition:  Status is: Observation  The patient will require care spanning > 2 midnights and should be moved to inpatient because: Hyponatremic week      Consultants:    Procedures:   Antimicrobials:     Subjective  Coherent awake alert no distress Otherwise seems fair with no distress  Objective: Vitals:   06/03/21 1401 06/03/21 2128 06/03/21 2235 06/04/21 0536  BP: 138/82 (!) 179/95 129/86 138/85  Pulse: 82 70 95 80  Resp: 15 16 17 16   Temp: 97.9 F (36.6 C) 98.6 F (37 C) 97.8 F (36.6 C) 98.6 F (37 C)  TempSrc: Oral Oral Oral Oral  SpO2: 96% 97% 95% 96%  Weight:      Height:        Intake/Output Summary (Last 24 hours) at 06/04/2021 1101 Last data filed at 06/04/2021 4235 Gross per 24 hour  Intake 700 ml  Output 800 ml  Net -100 ml    Filed Weights   05/31/21 2343  Weight: 48.1 kg    Examination:  Pleasant coherent can tell me date time year Can tell me place--asking about her PCP EOMI NCAT frail white female no distress Chest clear no rales rhonchi S1-S2 no murmur Abdomen slightly distended No lower extremity edema, no rash  Data Reviewed: personally reviewed   CBC    Component Value Date/Time   WBC 11.8 (H) 06/03/2021 0753   RBC 4.01 06/03/2021 0753  HGB 13.3 06/03/2021 0753   HCT 38.3 06/03/2021 0753   PLT 288 06/03/2021 0753   MCV 95.5 06/03/2021 0753   MCH 33.2 06/03/2021 0753   MCHC 34.7 06/03/2021 0753   RDW 11.9 06/03/2021 0753   LYMPHSABS 1.4 06/03/2021 0753   MONOABS 1.8 (H) 06/03/2021 0753   EOSABS 0.1 06/03/2021 0753   BASOSABS 0.0 06/03/2021 0753   CMP Latest Ref Rng & Units 06/04/2021 06/03/2021 06/02/2021  Glucose 70 - 99  mg/dL 101(H) 95 108(H)  BUN 8 - 23 mg/dL 22 19 24(H)  Creatinine 0.44 - 1.00 mg/dL 0.72 0.62 0.63  Sodium 135 - 145 mmol/L 127(L) 130(L) 124(L)  Potassium 3.5 - 5.1 mmol/L 4.3 4.3 3.2(L)  Chloride 98 - 111 mmol/L 95(L) 98 93(L)  CO2 22 - 32 mmol/L 26 26 22   Calcium 8.9 - 10.3 mg/dL 8.7(L) 8.6(L) 8.7(L)  Total Protein 6.5 - 8.1 g/dL - - 6.6  Total Bilirubin 0.3 - 1.2 mg/dL - - 1.4(H)  Alkaline Phos 38 - 126 U/L - - 51  AST 15 - 41 U/L - - 29  ALT 0 - 44 U/L - - 18     Radiology Studies: No results found.   Scheduled Meds:  enoxaparin (LOVENOX) injection  30 mg Subcutaneous Q24H   feeding supplement  237 mL Oral BID BM   losartan  50 mg Oral BID   montelukast  10 mg Oral Daily   multivitamin with minerals  1 tablet Oral Daily   pantoprazole  40 mg Oral BID AC   Continuous Infusions:     LOS: 2 days   Time spent: 40  Nita Sells, MD [facility] To contact the attending provider between 7A-7P or the covering provider during after hours 7P-7A, please log into the web site [email or web address] and access using universal [place] password for that web site. If you do not have the password, please call the hospital operator.  06/04/2021, 11:01 AM

## 2021-06-04 NOTE — TOC Progression Note (Signed)
Transition of Care [facility]) - Progression Note   Patient Details  Name: Michele [name] MRN: [id number] Date of Birth: 01/04/1927  Transition of Care [facility]) CM/SW [place], LCSW Phone Number: 06/04/2021, 10:10 AM  Clinical Narrative: CSW spoke with Butch Penny at [place] and provided update that patient is not yet medically stable for discharge. [facility] can accept patient tomorrow. TOC to follow.  Expected Discharge Plan: [place] Barriers to Discharge: Continued Medical Work up  Expected Discharge Plan and Services Expected Discharge Plan: [place] In-house Referral: Clinical Social Work Post Acute Care Choice: [place] Living arrangements for the past 2 months: [place]            DME Arranged: N/A DME Agency: NA  Readmission Risk Interventions No flowsheet data found.

## 2021-06-05 LAB — BASIC METABOLIC PANEL
Anion gap: 7 (ref 5–15)
BUN: 26 mg/dL — ABNORMAL HIGH (ref 8–23)
CO2: 27 mmol/L (ref 22–32)
Calcium: 8.9 mg/dL (ref 8.9–10.3)
Chloride: 97 mmol/L — ABNORMAL LOW (ref 98–111)
Creatinine, Ser: 0.65 mg/dL (ref 0.44–1.00)
GFR, Estimated: >60 mL/min (ref >60)
Glucose, Bld: 101 mg/dL — ABNORMAL HIGH (ref 70–99)
Potassium: 4.5 mmol/L (ref 3.5–5.1)
Sodium: 131 mmol/L — ABNORMAL LOW (ref 135–145)

## 2021-06-05 LAB — RESP PANEL BY RT-PCR (FLU A&B, COVID) ARPGX2
Influenza A by PCR: NEGATIVE
Influenza B by PCR: NEGATIVE
SARS Coronavirus 2 by RT PCR: NEGATIVE

## 2021-06-05 MED ORDER — SODIUM CHLORIDE 1 G PO TABS: 1.0000 g | ORAL_TABLET | Freq: Two times a day (BID) | ORAL | 0 refills | Status: AC

## 2021-06-05 NOTE — Hospital Course (Signed)
88 year old white female-prior ALF resident, chronic hyponatremia at baseline History obtained by son-patient was found on the floor seem confused unwitnessed fall -at baseline is completely coherent Sodium found to be 121 BUN/creatinine 20/0.6 (above baseline) LFTs normal, CT head: No acute intracranial process-extensive subcortical white matter hypodensities.  Sodium treated with restriction and salt tabs.

## 2021-06-05 NOTE — Progress Notes (Signed)
Physical Therapy Treatment Patient Details Name: Michele [name] MRN: [id number] DOB: 12-15-1926 Today's Date: 06/05/2021   History of Present Illness 88 yo female admitted after unwittenessed fall. adm with acute toxic metabolic encephalopathy likely d/t  hypovolemic hyponatremia. PMH: falls, scoliosis,GERD, HTN, chronic back pain    PT Comments    Pt requiring increased time to perform mobility.  Pt also easily distracted today requiring cues to remain on task.  Pt requiring min assist for mobility today for stability.  Pt plans to d/c back to [facility] rehab section today.    Recommendations for follow up therapy are one component of a multi-disciplinary discharge planning process, led by the attending physician.  Recommendations may be updated based on patient status, additional functional criteria and insurance authorization.  Follow Up Recommendations  Skilled nursing-short term rehab (<3 hours/day)     Assistance Recommended at Discharge Frequent or constant Supervision/Assistance  Patient can return home with the following A little help with walking and/or transfers;A little help with bathing/dressing/bathroom;Assist for transportation   Equipment Recommendations  Rolling walker (2 wheels)    Recommendations for Other Services       Precautions / Restrictions Precautions Precautions: Fall     Mobility  Bed Mobility Overal bed mobility: Needs Assistance Bed Mobility: Supine to Sit     Supine to sit: Min assist     General bed mobility comments: incr time, assist with trunk    Transfers Overall transfer level: Needs assistance Equipment used: Rolling walker (2 wheels) Transfers: Sit to/from Stand Sit to Stand: Min assist           General transfer comment: cues for hand placement, assist to rise and steady, retropulsion upon standing today    Ambulation/Gait Ambulation/Gait assistance: Min guard Gait Distance (Feet): 200 Feet Assistive device:  Rolling walker (2 wheels) Gait Pattern/deviations: Step-through pattern;Decreased stride length Gait velocity: decr     General Gait Details: cues for RW position, posture, safety with turns   Stairs             Wheelchair Mobility    Modified Rankin (Stroke Patients Only)       Balance Overall balance assessment: Needs assistance         Standing balance support: During functional activity;Reliant on assistive device for balance;Bilateral upper extremity supported Standing balance-Leahy Scale: Poor                              Cognition Arousal/Alertness: Awake/alert Behavior During Therapy: WFL for tasks assessed/performed                           Following Commands: Follows one step commands consistently;Follows multi-step commands with increased time                Exercises      General Comments        Pertinent Vitals/Pain Pain Assessment: No/denies pain Pain Intervention(s): Monitored during session;Repositioned    Home Living                          Prior Function            PT Goals (current goals can now be found in the care plan section) Progress towards PT goals: Progressing toward goals    Frequency    Min 3X/week      PT Plan Discharge plan  needs to be updated    Co-evaluation              AM-PAC PT "6 Clicks" Mobility   Outcome Measure  Help needed turning from your back to your side while in a flat bed without using bedrails?: A Little Help needed moving from lying on your back to sitting on the side of a flat bed without using bedrails?: A Little Help needed moving to and from a bed to a chair (including a wheelchair)?: A Little Help needed standing up from a chair using your arms (e.g., wheelchair or bedside chair)?: A Little Help needed to walk in hospital room?: A Little Help needed climbing 3-5 steps with a railing? : A Lot 6 Click Score: 17    End of Session  Equipment Utilized During Treatment: Gait belt Activity Tolerance: Patient tolerated treatment well Patient left: in chair;with call bell/phone within reach;with chair alarm set;with family/visitor present Nurse Communication: Mobility status PT Visit Diagnosis: Other abnormalities of gait and mobility (R26.89);Muscle weakness (generalized) (M62.81);History of falling (Z91.81)     Time: 5456-2563 PT Time Calculation (min) (ACUTE ONLY): 28 min  Charges:  $Gait Training: 23-37 mins                    Jannette Spanner PT, DPT Acute Rehabilitation Services Pager: [phone] Office: [place] 06/05/2021, 12:48 PM

## 2021-06-05 NOTE — Discharge Summary (Signed)
Physician Discharge Summary   Patient: Michele [name] MRN: [id number] DOB: 11/23/1926  Admit date:     05/31/2021  Discharge date: 06/05/21  Discharge Physician: Berle Mull   PCP: Burnard Bunting, MD   Recommendations at discharge:    Repeat BMP in 1 week  Discharge Diagnoses Principal Problem:   Hyponatremia Active Problems:   Essential hypertension  Hospital Course   88 year old white female-prior ALF resident, chronic hyponatremia at baseline History obtained by son-patient was found on the floor seem confused unwitnessed fall -at baseline is completely coherent Sodium found to be 121 BUN/creatinine 20/0.6 (above baseline) LFTs normal, CT head: No acute intracranial process-extensive subcortical white matter hypodensities.  Sodium treated with restriction and salt tabs.   Acute metabolic encephalopathy secondary to hypovolemic hyponatremia  06/02/21 08:55 06/03/21 03:36 06/04/21 03:21 06/05/21 03:16  Sodium 124 (L) 130 (L) 127 (L) 131 (L)  Mostly from poor PO intake. Low Serum osmolality on admission 254 -and increased to 290  probably has some component of SIADH Patient only eating 25% of her meals Ensure-I have encouraged her to take in more of the diet and a regular diet started on 2 g twice daily salt tabs discharge the patient on 1 g twice daily of salt tabs  No UTI UA was negative--no need for antibiotics  Mild dehydration on admission Not meeting criteria for AKI Treated with IV fluid  Found down on floor No injury however will need therapy evaluation as lives at independent living for the past 5 years skilled level of care based on therapy eval  Mild hypokalemia Given earlier K. Dur 40   Hypertensive urgency Resume losartan 50 twice daily Consider addition of medication in the outpatient setting     Consultants: none Procedures performed: none  Disposition: Skilled nursing facility Diet recommendation: Regular diet  DISCHARGE  MEDICATION: Allergies as of 06/05/2021       Reactions   Penicillins Other (See Comments)   Reaction unknown Has patient had a PCN reaction causing immediate rash, facial/tongue/throat swelling, SOB or lightheadedness with hypotension: n/a Has patient had a PCN reaction causing severe rash involving mucus membranes or skin necrosis: n/a Has patient had a PCN reaction that required hospitalization: n/a Has patient had a PCN reaction occurring within the last 10 years: n/a If all of the above answers are "NO", then may proceed with Cephalosporin use.   Pneumococcal Vaccines Other (See Comments)   Reaction unknown        Medication List     TAKE these medications    acetaminophen 500 MG tablet Commonly known as: TYLENOL Take 1,000 mg by mouth 3 (three) times daily.   feeding supplement Liqd Take 1 Container by mouth 2 (two) times daily between meals.   losartan 50 MG tablet Commonly known as: COZAAR Take 50 mg by mouth 2 (two) times daily.   melatonin 5 MG Tabs Take 5 mg by mouth at bedtime as needed (sleep).   montelukast 10 MG tablet Commonly known as: SINGULAIR Take 10 mg by mouth daily.   multivitamin with minerals Tabs tablet Take 1 tablet by mouth daily.   pantoprazole 40 MG tablet Commonly known as: PROTONIX Take 40 mg by mouth 2 (two) times daily before a meal.   sodium chloride 1 g tablet Take 1 tablet (1 g total) by mouth 2 (two) times daily with a meal for 3 days.   VITAMIN E PO Take 1 tablet by mouth daily.        Contact  information for follow-up providers     Burnard Bunting, MD. Schedule an appointment as soon as possible for a visit in 1 week(s).   Specialty: Internal Medicine Why: with CBC and BMP Contact information: [address] [place] [address] [phone]              Contact information for after-discharge care     Destination     [facility] .   Service: Skilled Nursing Contact  information: [address] [place] [phone]                     Discharge Exam: [facility] Weights   05/31/21 2343  Weight: 48.1 kg   Vitals:   06/04/21 0536 06/04/21 1408 06/04/21 2108 06/05/21 0443  BP: 138/85 109/87 (!) 154/91 (!) 152/93  Pulse: 80 93 75 81  Resp: 16 18 16 16   Temp: 98.6 F (37 C) 98.6 F (37 C) 98.9 F (37.2 C) 98.8 F (37.1 C)  TempSrc: Oral Oral Oral   SpO2: 96% 97% 97% 94%  Weight:      Height:        General: Appear in mild distress, no Rash; Oral Mucosa Clear, moist. no Abnormal Neck Mass Or lumps, Conjunctiva normal  Cardiovascular: S1 and S2 Present, no Murmur, Respiratory: good respiratory effort, Bilateral Air entry present and CTA, no Crackles, no wheezes Abdomen: Bowel Sound present Extremities: no Pedal edema Neurology: alert and oriented to place, and person affect appropriate. no new focal deficit Gait not checked due to patient safety concerns   Condition at discharge: good  The results of significant diagnostics from this hospitalization (including imaging, microbiology, ancillary and laboratory) are listed below for reference.   Imaging Studies: CT HEAD WO CONTRAST (5MM)  Result Date: 06/01/2021 CLINICAL DATA:  Delirium. EXAM: CT HEAD WITHOUT CONTRAST TECHNIQUE: Contiguous axial images were obtained from the base of the skull through the vertex without intravenous contrast. COMPARISON:  None. FINDINGS: Brain: No acute intracranial hemorrhage, midline shift or mass effect. No extra-axial fluid collection. There is diffuse atrophy. Extensive subcortical and periventricular white matter hypodensities are present bilaterally. No hydrocephalus. Vascular: Atherosclerotic calcification of the carotid siphons and right vertebral artery with no hyperdense vessel. Skull: Normal. Negative for fracture or focal lesion. Sinuses/Orbits: Mucosal thickening is present in the ethmoid air cells and maxillary sinus  on the left. The orbits are within normal limits. Other: None. IMPRESSION: 1. No acute intracranial process. 2. Extensive subcortical and periventricular white matter hypodensities, compatible with chronic microvascular ischemic changes. Electronically Signed   By: Brett Fairy M.D.   On: 06/01/2021 00:51   DG Chest Portable 1 View  Result Date: 06/01/2021 CLINICAL DATA:  Altered mental status. EXAM: PORTABLE CHEST 1 VIEW COMPARISON:  Chest radiograph dated 08/12/2019 FINDINGS: No focal consolidation, pleural effusion, pneumothorax. Stable cardiomegaly. Moderate size hiatal hernia. Degenerative changes of the spine and scoliosis. No acute osseous pathology. IMPRESSION: No acute cardiopulmonary process. Electronically Signed   By: Anner Crete M.D.   On: 06/01/2021 00:32    Microbiology: Results for orders placed or performed during the hospital encounter of 05/31/21  Urine Culture     Status: None   Collection Time: 05/31/21 11:48 PM   Specimen: Urine, Clean Catch  Result Value Ref Range Status   Specimen Description   Final    URINE, CLEAN CATCH Performed at [facility], [place] [address], [place], [place] [address]    Special  Requests   Final    NONE Performed at [facility], [place] [address], [place], [place] [address]    Culture   Final    NO GROWTH Performed at [facility], [place] [address], [place], [place] [address]    Report Status 06/02/2021 FINAL  Final  Resp Panel by RT-PCR (Flu A&B, Covid) Nasopharyngeal Swab     Status: None   Collection Time: 06/01/21 12:00 AM   Specimen: Nasopharyngeal Swab; Nasopharyngeal(NP) swabs in vial transport medium  Result Value Ref Range Status   SARS Coronavirus 2 by RT PCR NEGATIVE NEGATIVE Final    Comment: (NOTE) SARS-CoV-2 target nucleic acids are NOT DETECTED.  The SARS-CoV-2 RNA is generally detectable in upper respiratory specimens during the acute phase of infection. The  lowest concentration of SARS-CoV-2 viral copies this assay can detect is 138 copies/mL. A negative result does not preclude SARS-Cov-2 infection and should not be used as the sole basis for treatment or other patient management decisions. A negative result may occur with  improper specimen collection/handling, submission of specimen other than nasopharyngeal swab, presence of viral mutation(s) within the areas targeted by this assay, and inadequate number of viral copies(<138 copies/mL). A negative result must be combined with clinical observations, patient history, and epidemiological information. The expected result is Negative.  Fact Sheet for Patients:  EntrepreneurPulse.com.au  Fact Sheet for Healthcare Providers:  IncredibleEmployment.be  This test is no t yet approved or cleared by the [place] FDA and  has been authorized for detection and/or diagnosis of SARS-CoV-2 by FDA under an Emergency Use Authorization (EUA). This EUA will remain  in effect (meaning this test can be used) for the duration of the COVID-19 declaration under Section 564(b)(1) of the Act, 21 U.S.C.section 360bbb-3(b)(1), unless the authorization is terminated  or revoked sooner.       Influenza A by PCR NEGATIVE NEGATIVE Final   Influenza B by PCR NEGATIVE NEGATIVE Final    Comment: (NOTE) The Xpert Xpress SARS-CoV-2/FLU/RSV plus assay is intended as an aid in the diagnosis of influenza from Nasopharyngeal swab specimens and should not be used as a sole basis for treatment. Nasal washings and aspirates are unacceptable for Xpert Xpress SARS-CoV-2/FLU/RSV testing.  Fact Sheet for Patients: EntrepreneurPulse.com.au  Fact Sheet for Healthcare Providers: IncredibleEmployment.be  This test is not yet approved or cleared by the [place] FDA and has been authorized for detection and/or diagnosis of SARS-CoV-2 by FDA under  an Emergency Use Authorization (EUA). This EUA will remain in effect (meaning this test can be used) for the duration of the COVID-19 declaration under Section 564(b)(1) of the Act, 21 U.S.C. section 360bbb-3(b)(1), unless the authorization is terminated or revoked.  Performed at [facility], [place] [address], [place], [place] [address]   MRSA Next Gen by PCR, Nasal     Status: None   Collection Time: 06/01/21  9:47 PM  Result Value Ref Range Status   MRSA by PCR Next Gen NOT DETECTED NOT DETECTED Final    Comment: (NOTE) The GeneXpert MRSA Assay (FDA approved for NASAL specimens only), is one component of a comprehensive MRSA colonization surveillance program. It is not intended to diagnose MRSA infection nor to guide or monitor treatment for MRSA infections. Test performance is not FDA approved in patients less than 62 years old. Performed at [facility], [place] [address], [place], [place] [address]   Surgical pcr screen     Status: None  Collection Time: 06/01/21  9:57 PM   Specimen: Nasal Mucosa; Nasal Swab  Result Value Ref Range Status   MRSA, PCR NEGATIVE NEGATIVE Final   Staphylococcus aureus NEGATIVE NEGATIVE Final    Comment: (NOTE) The Xpert SA Assay (FDA approved for NASAL specimens in patients 40 years of age and older), is one component of a comprehensive surveillance program. It is not intended to diagnose infection nor to guide or monitor treatment. Performed at [facility], [place] [address], [place], [place] [address]     Labs: CBC: Recent Labs  Lab 05/31/21 2345 06/01/21 0941 06/02/21 0855 06/03/21 0753  WBC 12.0* 13.6* 14.7* 11.8*  NEUTROABS  --  11.1*  --  8.4*  HGB 13.9 13.3 13.4 13.3  HCT 40.1 38.1 38.4 38.3  MCV 95.2 95.5 96.5 95.5  PLT 313 292 303 426   Basic Metabolic Panel: Recent Labs  Lab 06/01/21 0941 06/01/21 1432 06/01/21 2120 06/02/21 0855 06/02/21 0934 06/03/21 0336  06/04/21 0321 06/05/21 0316  NA 121* 124* 125* 124*  --  130* 127* 131*  K 3.8 3.4* 3.4* 3.2*  --  4.3 4.3 4.5  CL 87* 87* 88* 93*  --  98 95* 97*  CO2 25 22 26 22   --  26 26 27   GLUCOSE 81 67* 88 108*  --  95 101* 101*  BUN 20 21 25* 24*  --  19 22 26*  CREATININE 0.62 0.54 0.76 0.63  --  0.62 0.72 0.65  CALCIUM 8.5* 8.7* 9.0 8.7*  --  8.6* 8.7* 8.9  MG 1.7  --   --   --  2.1  --   --   --   PHOS  --  2.7 2.3*  --   --   --   --   --    Liver Function Tests: Recent Labs  Lab 05/31/21 2345 06/01/21 0941 06/01/21 1432 06/01/21 2120 06/02/21 0855  AST 31 33  --   --  29  ALT 18 19  --   --  18  ALKPHOS 58 51  --   --  51  BILITOT 1.2 1.3*  --   --  1.4*  PROT 7.5 6.8  --   --  6.6  ALBUMIN 4.8 4.0 3.8 4.1 3.9   CBG: Recent Labs  Lab 05/31/21 2355  GLUCAP 100*    Discharge time spent: greater than 30 minutes.  Signed: Berle Mull, MD [facility] 06/05/2021

## 2021-06-05 NOTE — Plan of Care (Signed)
Plan of care reviewed and discussed with the patient. 

## 2021-06-05 NOTE — TOC Transition Note (Signed)
Transition of Care [facility]) - CM/SW Discharge Note  Patient Details  Name: Michele [name] MRN: [id number] Date of Birth: 10/29/1926  Transition of Care [facility]) CM/SW Contact:  Sherie Don, LCSW Phone Number: 06/05/2021, 11:34 AM  Clinical Narrative: Patient is medically stable for discharge to [facility]. Discharge summary, discharge orders, and FL2 faxed to facility in hub. CSW updated Butch Penny with [facility]. CSW spoke with daughter, Michele [name], and provided her with the discharge packet as daughter will transport the patient to SNF. TOC signing off.  Final next level of care: Skilled Nursing Facility Barriers to Discharge: Barriers Resolved  Patient Goals and CMS Choice Patient states their goals for this hospitalization and ongoing recovery are:: Return to [facility] for short-term rehab CMS Medicare.gov Compare Post Acute Care list provided to:: Patient Represenative (must comment) Michele [name] (daughter)) Choice offered to / list presented to : Adult Children  Discharge Plan and Services In-house Referral: Clinical Social Work Post Acute Care Choice: [place]          DME Arranged: N/A DME Agency: NA  Readmission Risk Interventions No flowsheet data found.

## 2021-06-06 ENCOUNTER — Non-Acute Institutional Stay (SKILLED_NURSING_FACILITY): Payer: Medicare Other | Admitting: Adult Health

## 2021-06-06 ENCOUNTER — Encounter: Payer: Self-pay | Admitting: Adult Health

## 2021-06-06 DIAGNOSIS — K219 Gastro-esophageal reflux disease without esophagitis: Secondary | ICD-10-CM

## 2021-06-06 DIAGNOSIS — D72829 Elevated white blood cell count, unspecified: Secondary | ICD-10-CM

## 2021-06-06 DIAGNOSIS — J4520 Mild intermittent asthma, uncomplicated: Secondary | ICD-10-CM | POA: Diagnosis not present

## 2021-06-06 DIAGNOSIS — R2681 Unsteadiness on feet: Secondary | ICD-10-CM

## 2021-06-06 DIAGNOSIS — I10 Essential (primary) hypertension: Secondary | ICD-10-CM

## 2021-06-06 DIAGNOSIS — E871 Hypo-osmolality and hyponatremia: Secondary | ICD-10-CM

## 2021-06-06 NOTE — Progress Notes (Signed)
Location:  [facility] of Service:  SNF (31) Provider:   Cindi Carbon, [place] [phone]   Burnard Bunting, MD  Patient Care Team: Burnard Bunting, MD as PCP - General (Internal Medicine)  Extended Emergency Contact Information Primary Emergency Contact: [facility] Address: [address]          [place], [place] [address] [name] of [employer] Phone: [phone] Relation: Daughter Interpreter needed? No Secondary Emergency Contact: Franchot Erichsen [place] of [place] Mobile Phone: [phone] Relation: Daughter  Code Status:  DNR Goals of care: Advanced Directive information Advanced Directives 05/31/2021  Does Patient Have a Medical Advance Directive? No  Type of Advance Directive -  Does patient want to make changes to medical advance directive? -  Copy of [place] in Chart? -  Would patient like information on creating a medical advance directive? No - Patient declined  Pre-existing out of facility DNR order (yellow form or pink MOST form) -     Chief Complaint  Patient presents with   Hospitalization Follow-up    HPI:  Pt is a 88 y.o. female seen today for a hospital f/u s/p admission from 05/31/21-06/05/21 admited for altered mental status and an unwitnessed fall. She is alert and oriented per notes at baseline. NA on admit was 121 BUN/Cr 20/0.6 LFTs normal. CT of the head no acute intracranial abnormalities but with extensive subcortical white matter hypodensity. She was treated with water restriction and salt tabs. Na 06/05/21 131. Urine NA 47 urin osmo 390, .  Does have some chronic low sodium at baseline. She was given IVF on admit. UA negative. BP was elevated during her stay and losartan was resumed. Covid test neg on discharge. WBC 11.8 at discharge was 14.7 on 06/02/21  At this time she is in rehab to receive therapy. She is ambulatory with a walker while here but would  like to get back to baseline which is using a cane intermittently per her account. [place] get back to [place]. She is alert and oriented now, eating and drinking well.   Past Medical History:  Diagnosis Date   Aortic atherosclerosis ([place]) 08/13/2019   Asthma    COPD (chronic obstructive pulmonary disease) ([place])    Cystocele 08/2011   Diverticulosis    Fractured rib 2017   GERD (gastroesophageal reflux disease)    Hematuria    GU workup   Hiatal hernia    Hypertension    Melanoma ([place]) 11/2001   Osteoarthritis    Osteoporosis    Scoliosis    UTI (urinary tract infection)    septic   Past Surgical History:  Procedure Laterality Date   HYSTEROSCOPY  06/1998   D&C-Benign   MELANOMA EXCISION      Allergies  Allergen Reactions   Penicillins Other (See Comments)    Reaction unknown Has patient had a PCN reaction causing immediate rash, facial/tongue/throat swelling, SOB or lightheadedness with hypotension: n/a Has patient had a PCN reaction causing severe rash involving mucus membranes or skin necrosis: n/a Has patient had a PCN reaction that required hospitalization: n/a Has patient had a PCN reaction occurring within the last 10 years: n/a If all of the above answers are "NO", then may proceed with Cephalosporin use.    Pneumococcal Vaccines Other (See Comments)    Reaction unknown    Outpatient Encounter Medications as of 06/06/2021  Medication Sig   acetaminophen (TYLENOL) 500 MG tablet Take  1,000 mg by mouth 3 (three) times daily.   feeding supplement (BOOST HIGH PROTEIN) LIQD Take 1 Container by mouth 2 (two) times daily between meals.   losartan (COZAAR) 50 MG tablet Take 50 mg by mouth 2 (two) times daily.    Melatonin 5 MG TABS Take 5 mg by mouth at bedtime as needed (sleep).   montelukast (SINGULAIR) 10 MG tablet Take 10 mg by mouth daily.    Multiple Vitamin (MULTIVITAMIN WITH MINERALS) TABS Take 1 tablet by mouth daily.   pantoprazole (PROTONIX) 40 MG tablet  Take 40 mg by mouth 2 (two) times daily before a meal.   sodium chloride 1 g tablet Take 1 tablet (1 g total) by mouth 2 (two) times daily with a meal for 3 days.   VITAMIN E PO Take 1 tablet by mouth daily.    No facility-administered encounter medications on file as of 06/06/2021.    Review of Systems  Constitutional:  Negative for activity change, appetite change, chills, diaphoresis, fatigue, fever and unexpected weight change.  HENT:  Negative for congestion.   Respiratory:  Negative for cough, shortness of breath and wheezing.   Cardiovascular:  Negative for chest pain, palpitations and leg swelling.  Gastrointestinal:  Negative for abdominal distention, abdominal pain, constipation and diarrhea.  Genitourinary:  Negative for difficulty urinating and dysuria.  Musculoskeletal:  Positive for gait problem. Negative for arthralgias, back pain, joint swelling and myalgias.  Neurological:  Positive for weakness. Negative for dizziness, tremors, seizures, syncope, facial asymmetry, speech difficulty, light-headedness, numbness and headaches.  Psychiatric/Behavioral:  Negative for agitation, behavioral problems and confusion.    Immunization History  Administered Date(s) Administered   Influenza-Unspecified 03/08/2016   Moderna Sars-Covid-2 Vaccination 04/10/2020   Pneumococcal Polysaccharide-23 06/29/2009   Td 06/29/2009   Zoster, Live 04/14/2012   Pertinent  Health Maintenance Due  Topic Date Due   MAMMOGRAM  07/11/2016   INFLUENZA VACCINE  12/24/2020   DEXA SCAN  Completed   Fall Risk 06/02/2021 06/03/2021 06/04/2021 06/04/2021 06/04/2021  Patient Fall Risk Level High fall risk High fall risk High fall risk High fall risk High fall risk   Functional Status Survey:    Vitals:   06/06/21 1037  BP: 134/79  Pulse: 74  Resp: 12  Temp: 99 F (37.2 C)  SpO2: 97%  Weight: 104 lb 3.2 oz (47.3 kg)   Body mass index is 19.69 kg/m. Physical Exam Vitals and nursing note reviewed.   Constitutional:      General: She is not in acute distress.    Appearance: She is not diaphoretic.  HENT:     Head: Normocephalic and atraumatic.     Nose: Nose normal.     Mouth/Throat:     Mouth: Mucous membranes are moist.     Pharynx: Oropharynx is clear.  Eyes:     Pupils: Pupils are equal, round, and reactive to light.  Neck:     Vascular: No JVD.  Cardiovascular:     Rate and Rhythm: Normal rate and regular rhythm.     Heart sounds: No murmur heard. Pulmonary:     Effort: Pulmonary effort is normal. No respiratory distress.     Breath sounds: Normal breath sounds. No wheezing.  Abdominal:     General: Abdomen is flat. Bowel sounds are normal. There is no distension.     Palpations: Abdomen is soft.     Tenderness: There is no abdominal tenderness.  Musculoskeletal:     Cervical back: No rigidity or  tenderness.     Right lower leg: No edema.     Left lower leg: No edema.  Lymphadenopathy:     Cervical: No cervical adenopathy.  Skin:    General: Skin is warm and dry.  Neurological:     General: No focal deficit present.     Mental Status: She is alert and oriented to person, place, and time. Mental status is at baseline.  Psychiatric:        Mood and Affect: Mood normal.    Labs reviewed: Recent Labs    06/01/21 0941 06/01/21 1432 06/01/21 2120 06/02/21 0855 06/02/21 0934 06/03/21 0336 06/04/21 0321 06/05/21 0316  NA 121* 124* 125*   < >  --  130* 127* 131*  K 3.8 3.4* 3.4*   < >  --  4.3 4.3 4.5  CL 87* 87* 88*   < >  --  98 95* 97*  CO2 25 22 26    < >  --  26 26 27   GLUCOSE 81 67* 88   < >  --  95 101* 101*  BUN 20 21 25*   < >  --  19 22 26*  CREATININE 0.62 0.54 0.76   < >  --  0.62 0.72 0.65  CALCIUM 8.5* 8.7* 9.0   < >  --  8.6* 8.7* 8.9  MG 1.7  --   --   --  2.1  --   --   --   PHOS  --  2.7 2.3*  --   --   --   --   --    < > = values in this interval not displayed.   Recent Labs    05/31/21 2345 06/01/21 0941 06/01/21 1432  06/01/21 2120 06/02/21 0855  AST 31 33  --   --  29  ALT 18 19  --   --  18  ALKPHOS 58 51  --   --  51  BILITOT 1.2 1.3*  --   --  1.4*  PROT 7.5 6.8  --   --  6.6  ALBUMIN 4.8 4.0 3.8 4.1 3.9   Recent Labs    06/01/21 0941 06/02/21 0855 06/03/21 0753  WBC 13.6* 14.7* 11.8*  NEUTROABS 11.1*  --  8.4*  HGB 13.3 13.4 13.3  HCT 38.1 38.4 38.3  MCV 95.5 96.5 95.5  PLT 292 303 288   No results found for: TSH No results found for: HGBA1C No results found for: CHOL, HDL, LDLCALC, LDLDIRECT, TRIG, CHOLHDL  Significant Diagnostic Results in last 30 days:  CT HEAD WO CONTRAST (5MM)  Result Date: 06/01/2021 CLINICAL DATA:  Delirium. EXAM: CT HEAD WITHOUT CONTRAST TECHNIQUE: Contiguous axial images were obtained from the base of the skull through the vertex without intravenous contrast. COMPARISON:  None. FINDINGS: Brain: No acute intracranial hemorrhage, midline shift or mass effect. No extra-axial fluid collection. There is diffuse atrophy. Extensive subcortical and periventricular white matter hypodensities are present bilaterally. No hydrocephalus. Vascular: Atherosclerotic calcification of the carotid siphons and right vertebral artery with no hyperdense vessel. Skull: Normal. Negative for fracture or focal lesion. Sinuses/Orbits: Mucosal thickening is present in the ethmoid air cells and maxillary sinus on the left. The orbits are within normal limits. Other: None. IMPRESSION: 1. No acute intracranial process. 2. Extensive subcortical and periventricular white matter hypodensities, compatible with chronic microvascular ischemic changes. Electronically Signed   By: Brett Fairy M.D.   On: 06/01/2021 00:51   DG Chest Portable 1  View  Result Date: 06/01/2021 CLINICAL DATA:  Altered mental status. EXAM: PORTABLE CHEST 1 VIEW COMPARISON:  Chest radiograph dated 08/12/2019 FINDINGS: No focal consolidation, pleural effusion, pneumothorax. Stable cardiomegaly. Moderate size hiatal hernia.  Degenerative changes of the spine and scoliosis. No acute osseous pathology. IMPRESSION: No acute cardiopulmonary process. Electronically Signed   By: Anner Crete M.D.   On: 06/01/2021 00:32    Assessment/Plan  1. Hyponatremia Has component of SIADH along with poor intake NA close to baseline on d/c 1/11 at 131 Continue sodium tablets Follow up BMP  2. Gastroesophageal reflux disease without esophagitis Controlled with protonix.   3. Essential hypertension BP was elevated in the hospital but is improved here at [facility] thus far.  Controlled continue losartan   4. Mild intermittent asthma without complication No acute issues  Continue Singulair  5. Gait instability Needs PT and OT Using walker.   6. Leukocytosis Mild Normal CXR and UA Recheck CBC   Family/ staff Communication: resident   Labs/tests ordered:  f/u BMP and CBC ordered for 1/16

## 2021-06-10 ENCOUNTER — Non-Acute Institutional Stay (SKILLED_NURSING_FACILITY): Payer: Medicare Other | Admitting: Internal Medicine

## 2021-06-10 ENCOUNTER — Encounter: Payer: Self-pay | Admitting: Internal Medicine

## 2021-06-10 DIAGNOSIS — I10 Essential (primary) hypertension: Secondary | ICD-10-CM | POA: Diagnosis not present

## 2021-06-10 DIAGNOSIS — K219 Gastro-esophageal reflux disease without esophagitis: Secondary | ICD-10-CM

## 2021-06-10 DIAGNOSIS — M7989 Other specified soft tissue disorders: Secondary | ICD-10-CM

## 2021-06-10 DIAGNOSIS — J4520 Mild intermittent asthma, uncomplicated: Secondary | ICD-10-CM | POA: Diagnosis not present

## 2021-06-10 DIAGNOSIS — E871 Hypo-osmolality and hyponatremia: Secondary | ICD-10-CM | POA: Diagnosis not present

## 2021-06-10 DIAGNOSIS — R2681 Unsteadiness on feet: Secondary | ICD-10-CM

## 2021-06-10 LAB — BASIC METABOLIC PANEL
BUN: 18 (ref 4–21)
CO2: 30 — AB (ref 13–22)
Chloride: 102 (ref 99–108)
Creatinine: 0.6 (ref 0.5–1.1)
Glucose: 84
Potassium: 4.2 (ref 3.4–5.3)
Sodium: 139 (ref 137–147)

## 2021-06-10 LAB — COMPREHENSIVE METABOLIC PANEL: Calcium: 8.9 (ref 8.7–10.7)

## 2021-06-10 NOTE — Progress Notes (Signed)
Provider:  Veleta Miners, MD Location:   [place] Room Number: [place] of Service:  SNF (31)  PCP: Burnard Bunting, MD Patient Care Team: Burnard Bunting, MD as PCP - General (Internal Medicine)  Extended Emergency Contact Information Primary Emergency Contact: [facility] Address: [address]          [place], [place] [address] [name] of [employer] Phone: [phone] Relation: Daughter Interpreter needed? No Secondary Emergency Contact: Franchot Erichsen [place] Mobile Phone: [phone] Relation: Daughter  Code Status: DNR Goals of Care: Advanced Directive information Advanced Directives 06/10/2021  Does Patient Have a Medical Advance Directive? Yes  Type of Paramedic of [place];Living will;Out of facility DNR (pink MOST or yellow form)  Does patient want to make changes to medical advance directive? No - Patient declined  Copy of [place] in Chart? Yes - validated most recent copy scanned in chart (See row information)  Would patient like information on creating a medical advance directive? -  Pre-existing out of facility DNR order (yellow form or pink MOST form) Yellow form placed in chart (order not valid for inpatient use)      Chief Complaint  Patient presents with   New Admit To SNF    New admission   Health Maintenance    Shingrix vaccine,Pneumonia vaccine, mammogram, tetanus/tdap, Covid booster, Flu vaccine    HPI: Patient is a 88 y.o. Michele [name] seen today for admission to SNF  Patient admitted in the hospital from 1/6-1/11 with hyponatremia  Patient has a history of hypertension  Patient lives by herself in her apartment and was found on the floor confused after unwitnessed fall.  In the ED her sodium was 121. CT of the head showed no acute intracranial processes just extensive subcortical white matter hypodensities Low serum osmolality  254. Was started on salt tablets Hyponatremia thought to be due to poor p.o. intake Patient denies any nausea vomiting or diarrhea before the episode.  Patient has done well in SNF.  She is walking with her walker.  Needing very less assistance from the nurses. Her acute complaint is high blood pressure. Also noticed some swelling in her ankles.  Denies any shortness of breath cough. Patient lives by herself in her apartment.  Still drives short distance.  Has a daughter in town who helps  Past Medical History:  Diagnosis Date   Aortic atherosclerosis ([place]) 08/13/2019   Asthma    COPD (chronic obstructive pulmonary disease) ([place])    Cystocele 08/2011   Diverticulosis    Fractured rib 2017   GERD (gastroesophageal reflux disease)    Hematuria    GU workup   Hiatal hernia    Hypertension    Melanoma ([place]) 11/2001   Osteoarthritis    Osteoporosis    Scoliosis    UTI (urinary tract infection)    septic   Past Surgical History:  Procedure Laterality Date   HYSTEROSCOPY  06/1998   D&C-Benign   MELANOMA EXCISION      reports that she has never smoked. She has never used smokeless tobacco. She reports that she does not drink alcohol and does not use drugs. Social History   Socioeconomic History   Marital status: Widowed    Spouse name: Not on file   Number of children: Not on file   Years of education: Not on file   Highest education level: Not on file  Occupational History   Not on file  Tobacco Use   Smoking status: Never   Smokeless tobacco: Never  Vaping Use   Vaping Use: Never used  Substance and Sexual Activity   Alcohol use: No    Alcohol/week: 0.0 standard drinks    Comment: cocktail    Drug use: No   Sexual activity: Never  Other Topics Concern   Not on file  Social History Narrative   Not on file   Social Determinants of Health   Financial Resource Strain: Not on file  Food Insecurity: Not on file  Transportation Needs: Not on file  Physical Activity:  Not on file  Stress: Not on file  Social Connections: Not on file  Intimate Partner Violence: Not on file    Functional Status Survey:    Family History  Problem Relation Age of Onset   Hypertension Father    Heart attack Father    Colon cancer Neg Hx     Health Maintenance  Topic Date Due   Zoster Vaccines- Shingrix (1 of 2) Never done   MAMMOGRAM  07/11/2016   TETANUS/TDAP  06/30/2019   COVID-19 Vaccine (5 - Booster) 05/03/2021   Pneumonia Vaccine 41+ Years old  Completed   INFLUENZA VACCINE  Completed   DEXA SCAN  Completed   HPV VACCINES  Aged Out    Allergies  Allergen Reactions   Penicillins Other (See Comments)    Reaction unknown Has patient had a PCN reaction causing immediate rash, facial/tongue/throat swelling, SOB or lightheadedness with hypotension: n/a Has patient had a PCN reaction causing severe rash involving mucus membranes or skin necrosis: n/a Has patient had a PCN reaction that required hospitalization: n/a Has patient had a PCN reaction occurring within the last 10 years: n/a If all of the above answers are "NO", then may proceed with Cephalosporin use.    Pneumococcal Vaccines Other (See Comments)    Reaction unknown    Allergies as of 06/10/2021       Reactions   Penicillins Other (See Comments)   Reaction unknown Has patient had a PCN reaction causing immediate rash, facial/tongue/throat swelling, SOB or lightheadedness with hypotension: n/a Has patient had a PCN reaction causing severe rash involving mucus membranes or skin necrosis: n/a Has patient had a PCN reaction that required hospitalization: n/a Has patient had a PCN reaction occurring within the last 10 years: n/a If all of the above answers are "NO", then may proceed with Cephalosporin use.   Pneumococcal Vaccines Other (See Comments)   Reaction unknown        Medication List        Accurate as of June 10, 2021 10:00 AM. If you have any questions, ask your nurse or  doctor.          acetaminophen 500 MG tablet Commonly known as: TYLENOL Take 1,000 mg by mouth 3 (three) times daily.   feeding supplement Liqd Take 1 Container by mouth 2 (two) times daily between meals.   losartan 50 MG tablet Commonly known as: COZAAR Take 50 mg by mouth 2 (two) times daily.   melatonin 5 MG Tabs Take 5 mg by mouth at bedtime as needed (sleep).   montelukast 10 MG tablet Commonly known as: SINGULAIR Take 10 mg by mouth daily.   multivitamin with minerals Tabs tablet Take 1 tablet by mouth daily.   pantoprazole 40 MG tablet Commonly known as: PROTONIX Take 40 mg by mouth 2 (two) times daily before a meal.   sodium chloride 1 g tablet Take  1 g by mouth 2 (two) times daily with a meal.   VITAMIN E PO Take 1 tablet by mouth daily.        Review of Systems  Constitutional:  Negative for activity change and appetite change.  HENT: Negative.    Respiratory:  Negative for cough and shortness of breath.   Cardiovascular:  Positive for leg swelling.  Gastrointestinal:  Negative for constipation.  Genitourinary: Negative.   Musculoskeletal:  Negative for arthralgias, gait problem and myalgias.  Skin: Negative.   Neurological:  Negative for dizziness and weakness.  Psychiatric/Behavioral:  Negative for confusion, dysphoric mood and sleep disturbance.    Vitals:   06/10/21 0939  BP: (!) 188/89  Pulse: 75  Resp: 16  Temp: 97.8 F (36.6 C)  SpO2: 96%  Weight: 104 lb 3.2 oz (47.3 kg)  Height: 5' (1.524 m)   Body mass index is 20.35 kg/m. Physical Exam Vitals reviewed.  Constitutional:      Appearance: Normal appearance.  HENT:     Head: Normocephalic.     Nose: Nose normal.     Mouth/Throat:     Mouth: Mucous membranes are moist.     Pharynx: Oropharynx is clear.  Eyes:     Pupils: Pupils are equal, round, and reactive to light.  Cardiovascular:     Rate and Rhythm: Normal rate and regular rhythm.     Pulses: Normal pulses.      Heart sounds: Normal heart sounds. No murmur heard. Pulmonary:     Effort: Pulmonary effort is normal.     Breath sounds: Normal breath sounds.  Abdominal:     General: Abdomen is flat. Bowel sounds are normal.     Palpations: Abdomen is soft.  Musculoskeletal:        General: Swelling present.     Cervical back: Neck supple.  Skin:    General: Skin is warm.  Neurological:     General: No focal deficit present.     Mental Status: She is alert and oriented to person, place, and time.  Psychiatric:        Mood and Affect: Mood normal.        Thought Content: Thought content normal.    Labs reviewed: Basic Metabolic Panel: Recent Labs    06/01/21 0941 06/01/21 1432 06/01/21 2120 06/02/21 0855 06/02/21 0934 06/03/21 0336 06/04/21 0321 06/05/21 0316  NA 121* 124* 125*   < >  --  130* 127* 131*  K 3.8 3.4* 3.4*   < >  --  4.3 4.3 4.5  CL 87* 87* 88*   < >  --  98 95* 97*  CO2 25 22 26    < >  --  26 26 27   GLUCOSE 81 67* 88   < >  --  95 101* 101*  BUN 20 21 25*   < >  --  19 22 26*  CREATININE 0.62 0.54 0.76   < >  --  0.62 0.72 0.65  CALCIUM 8.5* 8.7* 9.0   < >  --  8.6* 8.7* 8.9  MG 1.7  --   --   --  2.1  --   --   --   PHOS  --  2.7 2.3*  --   --   --   --   --    < > = values in this interval not displayed.   Liver Function Tests: Recent Labs    05/31/21 2345 06/01/21 0941 06/01/21 1432 06/01/21  2120 06/02/21 0855  AST 31 33  --   --  29  ALT 18 19  --   --  18  ALKPHOS 58 51  --   --  51  BILITOT 1.2 1.3*  --   --  1.4*  PROT 7.5 6.8  --   --  6.6  ALBUMIN 4.8 4.0 3.8 4.1 3.9   No results for input(s): LIPASE, AMYLASE in the last 8760 hours. No results for input(s): AMMONIA in the last 8760 hours. CBC: Recent Labs    06/01/21 0941 06/02/21 0855 06/03/21 0753  WBC 13.6* 14.7* 11.8*  NEUTROABS 11.1*  --  8.4*  HGB 13.3 13.4 13.3  HCT 38.1 38.4 38.3  MCV 95.5 96.5 95.5  PLT 292 303 288   Cardiac Enzymes: No results for input(s): CKTOTAL, CKMB,  CKMBINDEX, TROPONINI in the last 8760 hours. BNP: Invalid input(s): POCBNP No results found for: HGBA1C No results found for: TSH No results found for: VITAMINB12 No results found for: FOLATE No results found for: IRON, TIBC, FERRITIN  Imaging and Procedures obtained prior to SNF admission: CT HEAD WO CONTRAST (5MM)  Result Date: 06/01/2021 CLINICAL DATA:  Delirium. EXAM: CT HEAD WITHOUT CONTRAST TECHNIQUE: Contiguous axial images were obtained from the base of the skull through the vertex without intravenous contrast. COMPARISON:  None. FINDINGS: Brain: No acute intracranial hemorrhage, midline shift or mass effect. No extra-axial fluid collection. There is diffuse atrophy. Extensive subcortical and periventricular white matter hypodensities are present bilaterally. No hydrocephalus. Vascular: Atherosclerotic calcification of the carotid siphons and right vertebral artery with no hyperdense vessel. Skull: Normal. Negative for fracture or focal lesion. Sinuses/Orbits: Mucosal thickening is present in the ethmoid air cells and maxillary sinus on the left. The orbits are within normal limits. Other: None. IMPRESSION: 1. No acute intracranial process. 2. Extensive subcortical and periventricular white matter hypodensities, compatible with chronic microvascular ischemic changes. Electronically Signed   By: Brett Fairy M.D.   On: 06/01/2021 00:51   DG Chest Portable 1 View  Result Date: 06/01/2021 CLINICAL DATA:  Altered mental status. EXAM: PORTABLE CHEST 1 VIEW COMPARISON:  Chest radiograph dated 08/12/2019 FINDINGS: No focal consolidation, pleural effusion, pneumothorax. Stable cardiomegaly. Moderate size hiatal hernia. Degenerative changes of the spine and scoliosis. No acute osseous pathology. IMPRESSION: No acute cardiopulmonary process. Electronically Signed   By: Anner Crete M.D.   On: 06/01/2021 00:32    Assessment/Plan 1. Essential hypertension Blood Pressure elevated  Will  discontinue Sodium tabs Do not want to start her on New Med She is on Cozaar and per patient she misses the doses sometimes Will Continue to monitor for now  2. Hyponatremia BMP Today showed Sodium oif 139 Discontinue Sodium tabs Repeat BMP in 1 week  3. Leg swelling Lasix 20 mg one time dose  4. Mild intermittent asthma without complication On Singulair  5. Gait instability Needs to use her walker  6. Gastroesophageal reflux disease without esophagitis On Protonix  Family/ staff Communication:   Labs/tests ordered:BMP in 1 week

## 2022-02-25 ENCOUNTER — Other Ambulatory Visit (HOSPITAL_BASED_OUTPATIENT_CLINIC_OR_DEPARTMENT_OTHER): Payer: Self-pay

## 2022-02-25 MED ORDER — FLUAD QUADRIVALENT 0.5 ML IM PRSY
PREFILLED_SYRINGE | INTRAMUSCULAR | 0 refills | Status: DC
  Filled 2022-02-25: qty 0.5, 1d supply, fill #0

## 2022-06-09 ENCOUNTER — Non-Acute Institutional Stay (SKILLED_NURSING_FACILITY): Payer: Medicare Other | Admitting: Internal Medicine

## 2022-06-09 ENCOUNTER — Encounter: Payer: Self-pay | Admitting: Internal Medicine

## 2022-06-09 DIAGNOSIS — S2232XS Fracture of one rib, left side, sequela: Secondary | ICD-10-CM

## 2022-06-09 DIAGNOSIS — K219 Gastro-esophageal reflux disease without esophagitis: Secondary | ICD-10-CM | POA: Diagnosis not present

## 2022-06-09 DIAGNOSIS — I10 Essential (primary) hypertension: Secondary | ICD-10-CM | POA: Diagnosis not present

## 2022-06-09 DIAGNOSIS — R2681 Unsteadiness on feet: Secondary | ICD-10-CM | POA: Diagnosis not present

## 2022-06-09 DIAGNOSIS — J4520 Mild intermittent asthma, uncomplicated: Secondary | ICD-10-CM

## 2022-06-09 NOTE — Progress Notes (Signed)
Provider:  Veleta Miners, MD Location:   [facility] Room Number: Portal of Service:  SNF ([id number])  PCP: Burnard Bunting, MD Patient Care Team: Burnard Bunting, MD as PCP - General (Internal Medicine)  Extended Emergency Contact Information Primary Emergency Contact: [facility] Address: [address]          [place], [place] [address] [name] of [employer] Phone: [phone] Relation: Daughter Interpreter needed? No Secondary Emergency Contact: Franchot Erichsen [place] of [place] Mobile Phone: [phone] Relation: Daughter  Code Status:  DNR Goals of Care: Advanced Directive information    06/09/2022   12:19 PM  Advanced Directives  Does Patient Have a Medical Advance Directive? Yes  Type of Paramedic of [place];Living will;Out of facility DNR (pink MOST or yellow form)  Does patient want to make changes to medical advance directive? No - Patient declined  Copy of [place] in Chart? Yes - validated most recent copy scanned in chart (See row information)  Pre-existing out of facility DNR order (yellow form or pink MOST form) Yellow form placed in chart (order not valid for inpatient use)      Chief Complaint  Patient presents with   New Admit To SNF    New admission    HPI: Patient is a 89 y.o. female seen today for admission to Rehab  Patient has a history of hypertension.  She lives by herself in an apartment in [place] . According to her she was working on her Christmas tree when she pulled a muscle and started having severe pain.  She went to see Dr. Reynaldo Minium in the office.  I do not have those notes.  Per patient she was told she has rib fracture.  But she continued to have pain and was feeling weak.  It was decided to an admit her in rehab  Her pain is controlled with lidocaine patch and Tylenol.  Her appetite is still poor She walks with a stick but does  need mild assist. Denies any shortness of breath or cough She did have some nausea and was called Zofran by Dr. Reynaldo Minium No Dysuria or Fever. Nurses have noticed her to be more confused  Past Medical History:  Diagnosis Date   Aortic atherosclerosis ([place]) 08/13/2019   Asthma    COPD (chronic obstructive pulmonary disease) ([place])    Cystocele 08/2011   Diverticulosis    Fractured rib 2017   GERD (gastroesophageal reflux disease)    Hematuria    GU workup   Hiatal hernia    Hypertension    Melanoma ([place]) 11/2001   Osteoarthritis    Osteoporosis    Scoliosis    UTI (urinary tract infection)    septic   Past Surgical History:  Procedure Laterality Date   HYSTEROSCOPY  06/1998   D&C-Benign   MELANOMA EXCISION      reports that she has never smoked. She has never used smokeless tobacco. She reports that she does not drink alcohol and does not use drugs. Social History   Socioeconomic History   Marital status: Widowed    Spouse name: Not on file   Number of children: Not on file   Years of education: Not on file   Highest education level: Not on file  Occupational History   Not on file  Tobacco Use   Smoking status: Never   Smokeless tobacco: Never  Vaping Use   Vaping Use: Never used  Substance and  Sexual Activity   Alcohol use: No    Alcohol/week: 0.0 standard drinks of alcohol    Comment: cocktail    Drug use: No   Sexual activity: Never  Other Topics Concern   Not on file  Social History Narrative   Not on file   Social Determinants of Health   Financial Resource Strain: Not on file  Food Insecurity: Not on file  Transportation Needs: Not on file  Physical Activity: Not on file  Stress: Not on file  Social Connections: Not on file  Intimate Partner Violence: Not on file    Functional Status Survey:    Family History  Problem Relation Age of Onset   Hypertension Father    Heart attack Father    Colon cancer Neg Hx     Health Maintenance  Topic  Date Due   Medicare Annual Wellness (AWV)  Never done   Zoster Vaccines- Shingrix (1 of 2) Never done   MAMMOGRAM  07/11/2016   DTaP/Tdap/Td (4 - Td or Tdap) 06/30/2019   COVID-19 Vaccine (5 - 2023-24 season) 01/24/2022   Pneumonia Vaccine 66+ Years old  Completed   INFLUENZA VACCINE  Completed   DEXA SCAN  Completed   HPV VACCINES  Aged Out    Allergies  Allergen Reactions   Penicillins Other (See Comments)    Reaction unknown Has patient had a PCN reaction causing immediate rash, facial/tongue/throat swelling, SOB or lightheadedness with hypotension: n/a Has patient had a PCN reaction causing severe rash involving mucus membranes or skin necrosis: n/a Has patient had a PCN reaction that required hospitalization: n/a Has patient had a PCN reaction occurring within the last 10 years: n/a If all of the above answers are "NO", then may proceed with Cephalosporin use.    Pneumococcal Vaccines Other (See Comments)    Reaction unknown    Allergies as of 06/09/2022       Reactions   Penicillins Other (See Comments)   Reaction unknown Has patient had a PCN reaction causing immediate rash, facial/tongue/throat swelling, SOB or lightheadedness with hypotension: n/a Has patient had a PCN reaction causing severe rash involving mucus membranes or skin necrosis: n/a Has patient had a PCN reaction that required hospitalization: n/a Has patient had a PCN reaction occurring within the last 10 years: n/a If all of the above answers are "NO", then may proceed with Cephalosporin use.   Pneumococcal Vaccines Other (See Comments)   Reaction unknown        Medication List        Accurate as of June 09, 2022 12:21 PM. If you have any questions, ask your nurse or doctor.          STOP taking these medications    Fluad Quadrivalent 0.5 ML injection Generic drug: influenza vaccine adjuvanted Stopped by: Virgie Dad, MD       TAKE these medications    acetaminophen 500 MG  tablet Commonly known as: TYLENOL Take 1,000 mg by mouth 3 (three) times daily.   feeding supplement Liqd Take 1 Container by mouth 2 (two) times daily between meals.   losartan 50 MG tablet Commonly known as: COZAAR Take 50 mg by mouth 2 (two) times daily.   melatonin 5 MG Tabs Take 5 mg by mouth at bedtime as needed (sleep).   montelukast 10 MG tablet Commonly known as: SINGULAIR Take 10 mg by mouth daily.   multivitamin with minerals Tabs tablet Take 1 tablet by mouth daily.   ondansetron  4 MG tablet Commonly known as: ZOFRAN Take 4 mg by mouth every 8 (eight) hours as needed for nausea or vomiting.   pantoprazole 40 MG tablet Commonly known as: PROTONIX Take 40 mg by mouth 2 (two) times daily before a meal.        Review of Systems  Constitutional:  Positive for activity change and appetite change.  HENT: Negative.    Respiratory:  Negative for cough and shortness of breath.   Cardiovascular:  Positive for chest pain. Negative for leg swelling.  Gastrointestinal:  Negative for constipation.  Genitourinary: Negative.   Musculoskeletal:  Positive for gait problem. Negative for arthralgias and myalgias.  Skin: Negative.   Neurological:  Positive for weakness. Negative for dizziness.  Psychiatric/Behavioral:  Positive for confusion. Negative for dysphoric mood and sleep disturbance.     Vitals:   06/09/22 1101  BP: 132/61  Pulse: 65  Resp: 14  Temp: (!) 97.3 F (36.3 C)  SpO2: 95%  Weight: 126 lb 3.2 oz (57.2 kg)  Height: 5' (1.524 m)   Body mass index is 24.65 kg/m. Physical Exam Vitals reviewed.  Constitutional:      Appearance: Normal appearance.  HENT:     Head: Normocephalic.     Nose: Nose normal.     Mouth/Throat:     Mouth: Mucous membranes are moist.     Pharynx: Oropharynx is clear.  Eyes:     Pupils: Pupils are equal, round, and reactive to light.  Cardiovascular:     Rate and Rhythm: Normal rate and regular rhythm.     Pulses:  Normal pulses.     Heart sounds: Normal heart sounds. No murmur heard. Pulmonary:     Effort: Pulmonary effort is normal.     Breath sounds: Normal breath sounds.  Abdominal:     General: Abdomen is flat. Bowel sounds are normal.     Palpations: Abdomen is soft.  Musculoskeletal:        General: No swelling.     Cervical back: Neck supple.  Skin:    General: Skin is warm.  Neurological:     General: No focal deficit present.     Mental Status: She is alert and oriented to person, place, and time.  Psychiatric:        Mood and Affect: Mood normal.        Thought Content: Thought content normal.     Labs reviewed: Basic Metabolic Panel: Recent Labs    06/10/21 0000  NA 139  K 4.2  CL 102  CO2 30*  BUN 18  CREATININE 0.6  CALCIUM 8.9   Liver Function Tests: No results for input(s): "AST", "ALT", "ALKPHOS", "BILITOT", "PROT", "ALBUMIN" in the last 8760 hours. No results for input(s): "LIPASE", "AMYLASE" in the last 8760 hours. No results for input(s): "AMMONIA" in the last 8760 hours. CBC: No results for input(s): "WBC", "NEUTROABS", "HGB", "HCT", "MCV", "PLT" in the last 8760 hours. Cardiac Enzymes: No results for input(s): "CKTOTAL", "CKMB", "CKMBINDEX", "TROPONINI" in the last 8760 hours. BNP: Invalid input(s): "POCBNP" No results found for: "HGBA1C" No results found for: "TSH" No results found for: "VITAMINB12" No results found for: "FOLATE" No results found for: "IRON", "TIBC", "FERRITIN"  Imaging and Procedures obtained prior to SNF admission: CT HEAD WO CONTRAST (5MM)  Result Date: 06/01/2021 CLINICAL DATA:  Delirium. EXAM: CT HEAD WITHOUT CONTRAST TECHNIQUE: Contiguous axial images were obtained from the base of the skull through the vertex without intravenous contrast. COMPARISON:  None. FINDINGS:  Brain: No acute intracranial hemorrhage, midline shift or mass effect. No extra-axial fluid collection. There is diffuse atrophy. Extensive subcortical and  periventricular white matter hypodensities are present bilaterally. No hydrocephalus. Vascular: Atherosclerotic calcification of the carotid siphons and right vertebral artery with no hyperdense vessel. Skull: Normal. Negative for fracture or focal lesion. Sinuses/Orbits: Mucosal thickening is present in the ethmoid air cells and maxillary sinus on the left. The orbits are within normal limits. Other: None. IMPRESSION: 1. No acute intracranial process. 2. Extensive subcortical and periventricular white matter hypodensities, compatible with chronic microvascular ischemic changes. Electronically Signed   By: Brett Fairy M.D.   On: 06/01/2021 00:51   DG Chest Portable 1 View  Result Date: 06/01/2021 CLINICAL DATA:  Altered mental status. EXAM: PORTABLE CHEST 1 VIEW COMPARISON:  Chest radiograph dated 08/12/2019 FINDINGS: No focal consolidation, pleural effusion, pneumothorax. Stable cardiomegaly. Moderate size hiatal hernia. Degenerative changes of the spine and scoliosis. No acute osseous pathology. IMPRESSION: No acute cardiopulmonary process. Electronically Signed   By: Anner Crete M.D.   On: 06/01/2021 00:32    Assessment/Plan 1. Closed fracture of RIB Not sure how many Pain Seemed Control by Tylenol and Lidocaine Patch Working with therapy  2. Essential hypertension On losartan  3. Gait instability Working with therapy Need to use walker  4. Gastroesophageal reflux disease without esophagitis On Protonix  5. Mild intermittent asthma without complication Singulair    Family/ staff Communication:   Labs/tests ordered: CBC,CMP   Total time spent in this patient care encounter was  45_  minutes; greater than 50% of the visit spent counseling patient and staff, reviewing records , Labs and coordinating care for problems addressed at this encounter.

## 2022-06-11 LAB — CBC AND DIFFERENTIAL
HCT: 37 (ref 36–46)
Hemoglobin: 12.6 (ref 12.0–16.0)
Platelets: 283 10*3/uL (ref 150–400)
WBC: 7.5

## 2022-06-11 LAB — CBC: RBC: 3.78 — AB (ref 3.87–5.11)

## 2022-06-19 ENCOUNTER — Encounter: Payer: Self-pay | Admitting: Adult Health

## 2022-06-19 ENCOUNTER — Non-Acute Institutional Stay (SKILLED_NURSING_FACILITY): Payer: Medicare Other | Admitting: Adult Health

## 2022-06-19 DIAGNOSIS — R0781 Pleurodynia: Secondary | ICD-10-CM

## 2022-06-19 DIAGNOSIS — I10 Essential (primary) hypertension: Secondary | ICD-10-CM | POA: Diagnosis not present

## 2022-06-19 DIAGNOSIS — K219 Gastro-esophageal reflux disease without esophagitis: Secondary | ICD-10-CM

## 2022-06-19 DIAGNOSIS — R0789 Other chest pain: Secondary | ICD-10-CM

## 2022-06-19 DIAGNOSIS — J4520 Mild intermittent asthma, uncomplicated: Secondary | ICD-10-CM | POA: Diagnosis not present

## 2022-06-19 NOTE — Progress Notes (Signed)
Location:  [facility] Room Number: 157-A Place of Service:  SNF [phone])  Provider: Cleophus Molt  PCP: Burnard Bunting, MD Patient Care Team: Burnard Bunting, MD as PCP - General (Internal Medicine)  Extended Emergency Contact Information Primary Emergency Contact: [facility] Address: [address]          [place], [place] [address] [name] of [employer] Phone: [phone] Relation: Daughter Interpreter needed? No Secondary Emergency Contact: Franchot Erichsen [place] of [place] Mobile Phone: [phone] Relation: Daughter  Code Status: DNR Goals of care:  Advanced Directive information    06/19/2022   10:05 AM  Advanced Directives  Does Patient Have a Medical Advance Directive? Yes  Type of Paramedic of [place];Living will;Out of facility DNR (pink MOST or yellow form)  Does patient want to make changes to medical advance directive? No - Patient declined  Copy of [place] in Chart? Yes - validated most recent copy scanned in chart (See row information)  Pre-existing out of facility DNR order (yellow form or pink MOST form) Yellow form placed in chart (order not valid for inpatient use)     Allergies  Allergen Reactions   Penicillins Other (See Comments)    Reaction unknown Has patient had a PCN reaction causing immediate rash, facial/tongue/throat swelling, SOB or lightheadedness with hypotension: n/a Has patient had a PCN reaction causing severe rash involving mucus membranes or skin necrosis: n/a Has patient had a PCN reaction that required hospitalization: n/a Has patient had a PCN reaction occurring within the last 10 years: n/a If all of the above answers are "NO", then may proceed with Cephalosporin use.    Pneumococcal Vaccines Other (See Comments)    Reaction unknown    Chief Complaint  Patient presents with   Discharge Note    Discharge from skilled  care    HPI:  89 y.o. female seen for discharge from [facility] rehab back to independent living.   PMH Significant for HTN, insomnia, GERD, asthma, memory loss  She came to skilled rehab after a fall when working on her Christmas tree. She fell on the left side and exhibited pain. She went to see Dr Reynaldo Minium and xrays were taken 06/02/22.  His note indicates that the xray did not show an acute process such as fracture. The resident reports she did have a fracture. She went home after the apt at first but later she felt that she needed more help.  She reports that her pain has improved using tylenol and lidocaine patch. and she is ready for discharge. She will be going home with home care support to help with her bath and memory support. MMSE 25/24 Jun 2022  Past Medical History:  Diagnosis Date   Aortic atherosclerosis ([place]) 08/13/2019   Asthma    COPD (chronic obstructive pulmonary disease) ([place])    Cystocele 08/2011   Diverticulosis    Fractured rib 2017   GERD (gastroesophageal reflux disease)    Hematuria    GU workup   Hiatal hernia    Hypertension    Melanoma ([place]) 11/2001   Osteoarthritis    Osteoporosis    Scoliosis    UTI (urinary tract infection)    septic    Past Surgical History:  Procedure Laterality Date   HYSTEROSCOPY  06/1998   D&C-Benign   MELANOMA EXCISION        reports that she has never smoked. She has never used smokeless tobacco. She reports that  she does not drink alcohol and does not use drugs. Social History   Socioeconomic History   Marital status: Widowed    Spouse name: Not on file   Number of children: Not on file   Years of education: Not on file   Highest education level: Not on file  Occupational History   Not on file  Tobacco Use   Smoking status: Never   Smokeless tobacco: Never  Vaping Use   Vaping Use: Never used  Substance and Sexual Activity   Alcohol use: No    Alcohol/week: 0.0 standard drinks of alcohol    Comment: cocktail     Drug use: No   Sexual activity: Never  Other Topics Concern   Not on file  Social History Narrative   Not on file   Social Determinants of Health   Financial Resource Strain: Not on file  Food Insecurity: Not on file  Transportation Needs: Not on file  Physical Activity: Not on file  Stress: Not on file  Social Connections: Not on file  Intimate Partner Violence: Not on file   Functional Status Survey:    Allergies  Allergen Reactions   Penicillins Other (See Comments)    Reaction unknown Has patient had a PCN reaction causing immediate rash, facial/tongue/throat swelling, SOB or lightheadedness with hypotension: n/a Has patient had a PCN reaction causing severe rash involving mucus membranes or skin necrosis: n/a Has patient had a PCN reaction that required hospitalization: n/a Has patient had a PCN reaction occurring within the last 10 years: n/a If all of the above answers are "NO", then may proceed with Cephalosporin use.    Pneumococcal Vaccines Other (See Comments)    Reaction unknown    Pertinent  Health Maintenance Due  Topic Date Due   MAMMOGRAM  07/11/2016   INFLUENZA VACCINE  Completed   DEXA SCAN  Completed    Medications: Outpatient Encounter Medications as of 06/19/2022  Medication Sig   acetaminophen (TYLENOL) 325 MG tablet Take 650 mg by mouth in the morning and at bedtime.   feeding supplement (BOOST HIGH PROTEIN) LIQD Take 1 Container by mouth 2 (two) times daily between meals.   lidocaine (HM LIDOCAINE PATCH) 4 % Place onto the skin daily.   losartan (COZAAR) 50 MG tablet Take 50 mg by mouth 2 (two) times daily.    Melatonin 5 MG TABS Take 5 mg by mouth at bedtime as needed (sleep).   montelukast (SINGULAIR) 10 MG tablet Take 10 mg by mouth daily.    Multiple Vitamin (MULTIVITAMIN WITH MINERALS) TABS Take 1 tablet by mouth daily.   ondansetron (ZOFRAN) 4 MG tablet Take 4 mg by mouth every 8 (eight) hours as needed for nausea or vomiting.    pantoprazole (PROTONIX) 40 MG tablet Take 40 mg by mouth 2 (two) times daily before a meal.   [DISCONTINUED] acetaminophen (TYLENOL) 500 MG tablet Take 1,000 mg by mouth 3 (three) times daily.   No facility-administered encounter medications on file as of 06/19/2022.    Review of Systems  Constitutional:  Negative for activity change, appetite change, chills, diaphoresis, fatigue, fever and unexpected weight change.  HENT:  Negative for congestion.   Respiratory:  Negative for cough, shortness of breath and wheezing.        Rib pain improved  Cardiovascular:  Positive for leg swelling. Negative for chest pain and palpitations.  Gastrointestinal:  Negative for abdominal distention, abdominal pain, constipation and diarrhea.  Genitourinary:  Negative for difficulty urinating and  dysuria.  Musculoskeletal:  Positive for gait problem (uses walker .). Negative for arthralgias, back pain, joint swelling and myalgias.       Rib discomfort on the right improving.   Neurological:  Negative for dizziness, tremors, seizures, syncope, facial asymmetry, speech difficulty, weakness, light-headedness, numbness and headaches.  Psychiatric/Behavioral:  Negative for agitation, behavioral problems and confusion.        Memory loss     Vitals:   06/19/22 1003 06/19/22 1159  BP: (!) 163/87 113/78  Pulse: 71   Resp: 16   Temp: 97.8 F (36.6 C)   SpO2: 97%   Weight: 103 lb 12.8 oz (47.1 kg)   Height: 5' (1.524 m)    Body mass index is 20.27 kg/m. Physical Exam Vitals and nursing note reviewed.  Constitutional:      General: She is not in acute distress.    Appearance: She is not diaphoretic.  HENT:     Head: Normocephalic and atraumatic.  Neck:     Vascular: No JVD.  Cardiovascular:     Rate and Rhythm: Normal rate and regular rhythm.     Heart sounds: No murmur heard. Pulmonary:     Effort: Pulmonary effort is normal. No respiratory distress.     Breath sounds: Normal breath sounds. No  wheezing.     Comments: No tenderness or crepitus.  Abdominal:     General: Bowel sounds are normal. There is no distension.     Palpations: Abdomen is soft.     Tenderness: There is no abdominal tenderness.  Musculoskeletal:     Comments: BLE ankle edema +1  Skin:    General: Skin is warm and dry.  Neurological:     Mental Status: She is alert and oriented to person, place, and time.     Labs reviewed: Basic Metabolic Panel: No results for input(s): "NA", "K", "CL", "CO2", "GLUCOSE", "BUN", "CREATININE", "CALCIUM", "MG", "PHOS" in the last 8760 hours. Liver Function Tests: No results for input(s): "AST", "ALT", "ALKPHOS", "BILITOT", "PROT", "ALBUMIN" in the last 8760 hours. No results for input(s): "LIPASE", "AMYLASE" in the last 8760 hours. No results for input(s): "AMMONIA" in the last 8760 hours. CBC: Recent Labs    06/11/22 0000  WBC 7.5  HGB 12.6  HCT 37  PLT 283   Cardiac Enzymes: No results for input(s): "CKTOTAL", "CKMB", "CKMBINDEX", "TROPONINI" in the last 8760 hours. BNP: Invalid input(s): "POCBNP" CBG: No results for input(s): "GLUCAP" in the last 8760 hours.  Procedures and Imaging Studies During Stay: No results found.  Assessment/Plan:    1. Rib pain on left side Possible strain Pain has improved Will continue with lidocaine and tylenol for now Ready for discharge with home care support  2. Essential hypertension Controlled Continue losartan  3. Mild intermittent asthma without complication No current issues, on singular  4. Gastroesophageal reflux disease without esophagitis Continue protonix.    Patient is being discharged with the following home health services:  going home with home care support from [facility] for bath and reminders. Going home with med management.   Patient is being discharged with the following durable medical equipment:  has walker.   Patient has been advised to f/u with their PCP in 1-2 weeks to for a  transitions of care visit.  Social services at their facility was responsible for arranging this appointment.  Pt was provided with adequate prescriptions of noncontrolled medications to reach the scheduled appointment .  For controlled substances, a limited supply was provided as  appropriate for the individual patient.  If the pt normally receives these medications from a pain clinic or has a contract with another physician, these medications should be received from that clinic or physician only).    Future labs/tests needed:  NA

## 2022-06-20 ENCOUNTER — Encounter: Payer: Self-pay | Admitting: Adult Health

## 2022-06-29 ENCOUNTER — Emergency Department (HOSPITAL_COMMUNITY): Payer: Medicare Other

## 2022-06-29 ENCOUNTER — Encounter (HOSPITAL_COMMUNITY): Payer: Self-pay

## 2022-06-29 ENCOUNTER — Inpatient Hospital Stay (HOSPITAL_COMMUNITY)
Admission: EM | Admit: 2022-06-29 | Discharge: 2022-07-01 | DRG: 178 | Disposition: A | Discharge status: Skilled Nursing Facility | Payer: Medicare Other | Attending: Family Medicine | Admitting: Family Medicine

## 2022-06-29 ENCOUNTER — Other Ambulatory Visit: Payer: Self-pay

## 2022-06-29 DIAGNOSIS — Z85820 Personal history of malignant melanoma of skin: Secondary | ICD-10-CM

## 2022-06-29 DIAGNOSIS — R410 Disorientation, unspecified: Secondary | ICD-10-CM

## 2022-06-29 DIAGNOSIS — U071 COVID-19: Secondary | ICD-10-CM | POA: Diagnosis not present

## 2022-06-29 DIAGNOSIS — K449 Diaphragmatic hernia without obstruction or gangrene: Secondary | ICD-10-CM

## 2022-06-29 DIAGNOSIS — E876 Hypokalemia: Secondary | ICD-10-CM | POA: Diagnosis present

## 2022-06-29 DIAGNOSIS — J45909 Unspecified asthma, uncomplicated: Secondary | ICD-10-CM | POA: Diagnosis present

## 2022-06-29 DIAGNOSIS — R0902 Hypoxemia: Secondary | ICD-10-CM | POA: Diagnosis present

## 2022-06-29 DIAGNOSIS — Z887 Allergy status to serum and vaccine status: Secondary | ICD-10-CM

## 2022-06-29 DIAGNOSIS — J9601 Acute respiratory failure with hypoxia: Secondary | ICD-10-CM

## 2022-06-29 DIAGNOSIS — I700 Atherosclerosis of aorta: Secondary | ICD-10-CM | POA: Diagnosis present

## 2022-06-29 DIAGNOSIS — Z66 Do not resuscitate: Secondary | ICD-10-CM | POA: Diagnosis present

## 2022-06-29 DIAGNOSIS — J4489 Other specified chronic obstructive pulmonary disease: Secondary | ICD-10-CM | POA: Diagnosis present

## 2022-06-29 DIAGNOSIS — K219 Gastro-esophageal reflux disease without esophagitis: Secondary | ICD-10-CM | POA: Diagnosis present

## 2022-06-29 DIAGNOSIS — E860 Dehydration: Secondary | ICD-10-CM | POA: Diagnosis present

## 2022-06-29 DIAGNOSIS — Z8249 Family history of ischemic heart disease and other diseases of the circulatory system: Secondary | ICD-10-CM

## 2022-06-29 DIAGNOSIS — R7989 Other specified abnormal findings of blood chemistry: Secondary | ICD-10-CM | POA: Insufficient documentation

## 2022-06-29 DIAGNOSIS — E871 Hypo-osmolality and hyponatremia: Secondary | ICD-10-CM | POA: Diagnosis present

## 2022-06-29 DIAGNOSIS — I10 Essential (primary) hypertension: Secondary | ICD-10-CM | POA: Diagnosis present

## 2022-06-29 DIAGNOSIS — M419 Scoliosis, unspecified: Secondary | ICD-10-CM | POA: Diagnosis present

## 2022-06-29 DIAGNOSIS — J4520 Mild intermittent asthma, uncomplicated: Secondary | ICD-10-CM | POA: Diagnosis present

## 2022-06-29 DIAGNOSIS — Z880 Allergy status to penicillin: Secondary | ICD-10-CM

## 2022-06-29 LAB — LACTIC ACID, PLASMA: Lactic Acid, Venous: 1.7 mmol/L (ref 0.5–1.9)

## 2022-06-29 LAB — CBC WITH DIFFERENTIAL/PLATELET
Abs Immature Granulocytes: 0.02 10*3/uL (ref 0.00–0.07)
Basophils Absolute: 0 10*3/uL (ref 0.0–0.1)
Basophils Relative: 0 %
Eosinophils Absolute: 0 10*3/uL (ref 0.0–0.5)
Eosinophils Relative: 0 %
HCT: 41.1 % (ref 36.0–46.0)
Hemoglobin: 13.8 g/dL (ref 12.0–15.0)
Immature Granulocytes: 0 %
Lymphocytes Relative: 8 %
Lymphs Abs: 0.6 10*3/uL — ABNORMAL LOW (ref 0.7–4.0)
MCH: 33.3 pg (ref 26.0–34.0)
MCHC: 33.6 g/dL (ref 30.0–36.0)
MCV: 99 fL (ref 80.0–100.0)
Monocytes Absolute: 1.1 10*3/uL — ABNORMAL HIGH (ref 0.1–1.0)
Monocytes Relative: 15 %
Neutro Abs: 5.7 10*3/uL (ref 1.7–7.7)
Neutrophils Relative %: 77 %
Platelets: 203 10*3/uL (ref 150–400)
RBC: 4.15 MIL/uL (ref 3.87–5.11)
RDW: 12.7 % (ref 11.5–15.5)
WBC: 7.4 10*3/uL (ref 4.0–10.5)
nRBC: 0 % (ref 0.0–0.2)

## 2022-06-29 LAB — URINALYSIS, ROUTINE W REFLEX MICROSCOPIC
Bacteria, UA: NONE SEEN
Bilirubin Urine: NEGATIVE
Glucose, UA: 50 mg/dL — AB
Ketones, ur: 5 mg/dL — AB
Leukocytes,Ua: NEGATIVE
Nitrite: NEGATIVE
Protein, ur: 100 mg/dL — AB
Specific Gravity, Urine: 1.012 (ref 1.005–1.030)
pH: 6 (ref 5.0–8.0)

## 2022-06-29 LAB — COMPREHENSIVE METABOLIC PANEL
ALT: 13 U/L (ref 0–44)
AST: 23 U/L (ref 15–41)
Albumin: 4 g/dL (ref 3.5–5.0)
Alkaline Phosphatase: 45 U/L (ref 38–126)
Anion gap: 12 (ref 5–15)
BUN: 16 mg/dL (ref 8–23)
CO2: 27 mmol/L (ref 22–32)
Calcium: 9 mg/dL (ref 8.9–10.3)
Chloride: 92 mmol/L — ABNORMAL LOW (ref 98–111)
Creatinine, Ser: 0.65 mg/dL (ref 0.44–1.00)
GFR, Estimated: >60 mL/min (ref >60)
Glucose, Bld: 134 mg/dL — ABNORMAL HIGH (ref 70–99)
Potassium: 3.4 mmol/L — ABNORMAL LOW (ref 3.5–5.1)
Sodium: 131 mmol/L — ABNORMAL LOW (ref 135–145)
Total Bilirubin: 0.7 mg/dL (ref 0.3–1.2)
Total Protein: 6.8 g/dL (ref 6.5–8.1)

## 2022-06-29 LAB — RESP PANEL BY RT-PCR (RSV, FLU A&B, COVID)  RVPGX2
Influenza A by PCR: NEGATIVE
Influenza B by PCR: NEGATIVE
Resp Syncytial Virus by PCR: NEGATIVE
SARS Coronavirus 2 by RT PCR: POSITIVE — AB

## 2022-06-29 LAB — CBG MONITORING, ED: Glucose-Capillary: 105 mg/dL — ABNORMAL HIGH (ref 70–99)

## 2022-06-29 LAB — TROPONIN I (HIGH SENSITIVITY): Troponin I (High Sensitivity): 29 ng/L — ABNORMAL HIGH (ref <18)

## 2022-06-29 MED ORDER — SODIUM CHLORIDE 0.9 % IV BOLUS
500.0000 mL | Freq: Once | INTRAVENOUS | Status: AC
  Administered 2022-06-29: 500 mL via INTRAVENOUS

## 2022-06-29 MED ORDER — ONDANSETRON HCL 4 MG/2ML IJ SOLN
4.0000 mg | Freq: Once | INTRAMUSCULAR | Status: AC
  Administered 2022-06-29: 4 mg via INTRAVENOUS
  Filled 2022-06-29: qty 2

## 2022-06-29 NOTE — ED Notes (Signed)
This RN spoke to patient's daughter, Mrs. Claiborne Billings and gave update on mother.

## 2022-06-29 NOTE — ED Provider Notes (Signed)
Michele [name]   CSN: [id number] Arrival date & time: 06/29/22  2043     History  Chief Complaint  Patient presents with   AMS/fatigue/UTI?    Michele [name] is a 89 y.o. female.  Pt is a 89 yo with a pmhx significant for osteoporosis, cystocele, melanoma, htn, frequent utis, asthma, oa, gerd, copd, scoliosis, and aortic atherosclerosis.  Pt's daughter called EMS because pt has been weak.  She's not had an appetite.  She has been mildly confused.         Home Medications Prior to Admission medications   Medication Sig Start Date End Date Taking? Authorizing Provider  acetaminophen (TYLENOL) 325 MG tablet Take 650 mg by mouth in the morning and at bedtime.    [provider]  feeding supplement (BOOST HIGH PROTEIN) LIQD Take 1 Container by mouth 2 (two) times daily between meals.    [provider]  lidocaine (HM LIDOCAINE PATCH) 4 % Place onto the skin daily.    [provider]  losartan (COZAAR) 50 MG tablet Take 50 mg by mouth 2 (two) times daily.  10/13/14   [provider]  Melatonin 5 MG TABS Take 5 mg by mouth at bedtime as needed (sleep).    [provider]  montelukast (SINGULAIR) 10 MG tablet Take 10 mg by mouth daily.  12/15/14   [provider]  Multiple Vitamin (MULTIVITAMIN WITH MINERALS) TABS Take 1 tablet by mouth daily.    [provider]  ondansetron (ZOFRAN) 4 MG tablet Take 4 mg by mouth every 8 (eight) hours as needed for nausea or vomiting.    [provider]  pantoprazole (PROTONIX) 40 MG tablet Take 40 mg by mouth 2 (two) times daily before a meal.    [provider]      Allergies    Penicillins and Pneumococcal vaccines    Review of Systems   Review of Systems  Neurological:  Positive for weakness.  All other systems reviewed and are negative.   Physical Exam Updated Vital Signs BP (!) 162/102 (BP Location:  Right Arm)   Pulse (!) 42   Temp 98.9 F (37.2 C) (Oral)   Resp 19   LMP 05/26/1997   SpO2 (!) 79%  Physical Exam Vitals and nursing [name] reviewed.  Constitutional:      Appearance: Normal appearance.  HENT:     Head: Normocephalic and atraumatic.     Right Ear: External ear normal.     Left Ear: External ear normal.     Nose: Nose normal.     Mouth/Throat:     Mouth: Mucous membranes are dry.  Eyes:     Extraocular Movements: Extraocular movements intact.     Conjunctiva/sclera: Conjunctivae normal.     Pupils: Pupils are equal, round, and reactive to light.  Cardiovascular:     Rate and Rhythm: Regular rhythm. Tachycardia present.     Pulses: Normal pulses.     Heart sounds: Normal heart sounds.  Pulmonary:     Effort: Pulmonary effort is normal.     Breath sounds: Normal breath sounds.  Abdominal:     General: Abdomen is flat. Bowel sounds are normal.     Palpations: Abdomen is soft.  Musculoskeletal:        General: Normal range of motion.     Cervical back: Normal range of motion and neck supple.  Skin:    General: Skin is warm.  Capillary Refill: Capillary refill takes less than 2 seconds.  Neurological:     General: No focal deficit present.     Mental Status: She is alert and oriented to person, place, and time.  Psychiatric:        Mood and Affect: Mood normal.        Behavior: Behavior normal.     ED Results / Procedures / Treatments   Labs (all labs ordered are listed, but only abnormal results are displayed) Labs Reviewed  RESP PANEL BY RT-PCR (RSV, FLU A&B, COVID)  [id number] - Abnormal; Notable for the following components:      Result Value   SARS Coronavirus 2 by RT PCR POSITIVE (*)    All other components within normal limits  CBC WITH DIFFERENTIAL/PLATELET - Abnormal; Notable for the following components:   Lymphs Abs 0.6 (*)    Monocytes Absolute 1.1 (*)    All other components within normal limits  COMPREHENSIVE METABOLIC PANEL - Abnormal;  Notable for the following components:   Sodium 131 (*)    Potassium 3.4 (*)    Chloride 92 (*)    Glucose, Bld 134 (*)    All other components within normal limits  URINALYSIS, ROUTINE W REFLEX MICROSCOPIC - Abnormal; Notable for the following components:   Glucose, UA 50 (*)    Hgb urine dipstick SMALL (*)    Ketones, ur 5 (*)    Protein, ur 100 (*)    All other components within normal limits  CBG MONITORING, ED - Abnormal; Notable for the following components:   Glucose-Capillary 105 (*)    All other components within normal limits  TROPONIN I (HIGH SENSITIVITY) - Abnormal; Notable for the following components:   Troponin I (High Sensitivity) 29 (*)    All other components within normal limits  LACTIC ACID, PLASMA  LACTIC ACID, PLASMA  TROPONIN I (HIGH SENSITIVITY)    EKG None  Radiology DG Chest Portable 1 View  Result Date: 06/29/2022 CLINICAL DATA:  Shortness of breath EXAM: PORTABLE CHEST 1 VIEW COMPARISON:  06/01/2021 FINDINGS: Heart and mediastinal contours within normal limits. Large hiatal hernia. No confluent airspace opacities or effusions. Thoracolumbar scoliosis and degenerative changes. No acute bony abnormality. IMPRESSION: No active cardiopulmonary disease. Large hiatal hernia. Electronically Signed   By: Rolm Baptise M.D.   On: 06/29/2022 21:14    Procedures Procedures    Medications Ordered in ED Medications  sodium chloride 0.9 % bolus 500 mL (500 mLs Intravenous New Bag/Given 06/29/22 2153)  ondansetron [facility]) injection 4 mg (4 mg Intravenous Given 06/29/22 2153)    ED Course/ Medical Decision Making/ A&P                             Medical Decision Making Amount and/or Complexity of Data Reviewed Labs: ordered. Radiology: ordered.  Risk Prescription drug management. Decision regarding hospitalization.   This patient presents to the ED for concern of ams, this involves an extensive number of treatment options, and is a complaint that carries  with it a high risk of complications and morbidity.  The differential diagnosis includes infection, electrolyte infection, anemia   Co morbidities that complicate the patient evaluation  osteoporosis, cystocele, melanoma, htn, frequent utis, asthma, oa, gerd, copd, scoliosis, and aortic atherosclerosis   Additional history obtained:  Additional history obtained from epic chart review External records from outside source obtained and reviewed including EMS report   Lab Tests:  I  Ordered, and personally interpreted labs.  The pertinent results include:  + covid, cbc nl, cmp nl, trop 29, lactic 1.7; ua nl   Imaging Studies ordered:  I ordered imaging studies including cxr and ct chest I independently visualized and interpreted imaging which showed  No active cardiopulmonary disease.    Large hiatal hernia.   I agree with the radiologist interpretation   Cardiac Monitoring:  The patient was maintained on a cardiac monitor.  I personally viewed and interpreted the cardiac monitored which showed an underlying rhythm of: irreg rhythm (afib vs st with pacs)   Medicines ordered and prescription drug management:  I ordered medication including ivfs and zofran  for dehydration and nausea  Reevaluation of the patient after these medicines showed that the patient improved I have reviewed the patients home medicines and have made adjustments as needed   Critical Interventions:  Oxygen   Consultations Obtained:  I requested consultation with the hospitalist (Dr. Marlowe Sax),  and discussed lab and imaging findings as well as pertinent plan - she requests a ct chest prior to admitting.   Problem List / ED Course:  Covid:  pt's O2 dropped to 88%, so she was put on 2L. Irregular hr:  pt is tremulous.  No hx of afib.  Pt will be continued on the monitor.   Reevaluation:  After the interventions noted above, I reevaluated the patient and found that they have :improved   Social  Determinants of Health:  Lives in [place]:  After consideration of the diagnostic results and the patients response to treatment, I feel that the patent would benefit from admission.    CRITICAL CARE Performed by: Isla Pence   Total critical care time: 30 minutes  Critical care time was exclusive of separately billable procedures and treating other patients.  Critical care was necessary to treat or prevent imminent or life-threatening deterioration.  Critical care was time spent personally by me on the following activities: development of treatment plan with patient and/or surrogate as well as nursing, discussions with consultants, evaluation of patient's response to treatment, examination of patient, obtaining history from patient or surrogate, ordering and performing treatments and interventions, ordering and review of laboratory studies, ordering and review of radiographic studies, pulse oximetry and re-evaluation of patient's condition.   Michele [name] was evaluated in Emergency Department on 06/29/2022 for the symptoms described in the history of present illness. She was evaluated in the context of the global COVID-19 pandemic, which necessitated consideration that the patient might be at risk for infection with the SARS-CoV-2 virus that causes COVID-19. Institutional protocols and algorithms that pertain to the evaluation of patients at risk for COVID-19 are in a state of rapid change based on information released by regulatory bodies including the CDC and federal and state organizations. These policies and algorithms were followed during the patient's care in the ED.         Final Clinical Impression(s) / ED Diagnoses Final diagnoses:  COVID-19  Acute respiratory failure with hypoxia [facility])    Rx / DC Orders ED Discharge Orders     None         Isla Pence, MD 06/29/22 2325

## 2022-06-29 NOTE — ED Triage Notes (Signed)
Patient arrives with [facility] from [place]. Pt's daughter reports patient has been mildly confused, has a decreased appetite, is fatigued and weak. Pt has hx of UTI and hx of hyponatremia. Pt is a DNR, paperwork at bedside.  EMS vitals:  132/98 HR 80-140 irregular 20 RR 98% O2 on RA CBG 208

## 2022-06-30 ENCOUNTER — Encounter (HOSPITAL_COMMUNITY): Payer: Self-pay

## 2022-06-30 ENCOUNTER — Emergency Department (HOSPITAL_COMMUNITY): Payer: Medicare Other

## 2022-06-30 DIAGNOSIS — Z8249 Family history of ischemic heart disease and other diseases of the circulatory system: Secondary | ICD-10-CM | POA: Diagnosis not present

## 2022-06-30 DIAGNOSIS — M419 Scoliosis, unspecified: Secondary | ICD-10-CM | POA: Diagnosis present

## 2022-06-30 DIAGNOSIS — E876 Hypokalemia: Secondary | ICD-10-CM | POA: Diagnosis present

## 2022-06-30 DIAGNOSIS — J4520 Mild intermittent asthma, uncomplicated: Secondary | ICD-10-CM | POA: Diagnosis present

## 2022-06-30 DIAGNOSIS — Z85820 Personal history of malignant melanoma of skin: Secondary | ICD-10-CM | POA: Diagnosis not present

## 2022-06-30 DIAGNOSIS — Z880 Allergy status to penicillin: Secondary | ICD-10-CM | POA: Diagnosis not present

## 2022-06-30 DIAGNOSIS — J4489 Other specified chronic obstructive pulmonary disease: Secondary | ICD-10-CM | POA: Diagnosis present

## 2022-06-30 DIAGNOSIS — Z887 Allergy status to serum and vaccine status: Secondary | ICD-10-CM | POA: Diagnosis not present

## 2022-06-30 DIAGNOSIS — J9601 Acute respiratory failure with hypoxia: Secondary | ICD-10-CM | POA: Diagnosis present

## 2022-06-30 DIAGNOSIS — U071 COVID-19: Secondary | ICD-10-CM | POA: Diagnosis present

## 2022-06-30 DIAGNOSIS — E871 Hypo-osmolality and hyponatremia: Secondary | ICD-10-CM | POA: Diagnosis present

## 2022-06-30 DIAGNOSIS — K449 Diaphragmatic hernia without obstruction or gangrene: Secondary | ICD-10-CM | POA: Diagnosis present

## 2022-06-30 DIAGNOSIS — K219 Gastro-esophageal reflux disease without esophagitis: Secondary | ICD-10-CM | POA: Diagnosis present

## 2022-06-30 DIAGNOSIS — I10 Essential (primary) hypertension: Secondary | ICD-10-CM | POA: Diagnosis present

## 2022-06-30 DIAGNOSIS — R410 Disorientation, unspecified: Secondary | ICD-10-CM

## 2022-06-30 DIAGNOSIS — I700 Atherosclerosis of aorta: Secondary | ICD-10-CM | POA: Diagnosis present

## 2022-06-30 DIAGNOSIS — E860 Dehydration: Secondary | ICD-10-CM | POA: Diagnosis present

## 2022-06-30 DIAGNOSIS — R0902 Hypoxemia: Secondary | ICD-10-CM | POA: Diagnosis present

## 2022-06-30 DIAGNOSIS — Z66 Do not resuscitate: Secondary | ICD-10-CM | POA: Diagnosis present

## 2022-06-30 LAB — TROPONIN I (HIGH SENSITIVITY): Troponin I (High Sensitivity): 25 ng/L — ABNORMAL HIGH (ref <18)

## 2022-06-30 LAB — BASIC METABOLIC PANEL
Anion gap: 10 (ref 5–15)
BUN: 15 mg/dL (ref 8–23)
CO2: 28 mmol/L (ref 22–32)
Calcium: 8.8 mg/dL — ABNORMAL LOW (ref 8.9–10.3)
Chloride: 95 mmol/L — ABNORMAL LOW (ref 98–111)
Creatinine, Ser: 0.83 mg/dL (ref 0.44–1.00)
GFR, Estimated: >60 mL/min (ref >60)
Glucose, Bld: 97 mg/dL (ref 70–99)
Potassium: 3.6 mmol/L (ref 3.5–5.1)
Sodium: 133 mmol/L — ABNORMAL LOW (ref 135–145)

## 2022-06-30 LAB — MAGNESIUM: Magnesium: 2.1 mg/dL (ref 1.7–2.4)

## 2022-06-30 MED ORDER — DEXAMETHASONE SODIUM PHOSPHATE 10 MG/ML IJ SOLN
6.0000 mg | INTRAMUSCULAR | Status: DC
  Administered 2022-06-30 – 2022-07-01 (×2): 6 mg via INTRAVENOUS
  Filled 2022-06-30 (×2): qty 1

## 2022-06-30 MED ORDER — NIRMATRELVIR/RITONAVIR (PAXLOVID)TABLET
3.0000 | ORAL_TABLET | Freq: Two times a day (BID) | ORAL | Status: DC
  Administered 2022-06-30 – 2022-07-01 (×3): 3 via ORAL
  Filled 2022-06-30: qty 30

## 2022-06-30 MED ORDER — SODIUM CHLORIDE 0.9 % IV SOLN: INTRAVENOUS | Status: AC

## 2022-06-30 MED ORDER — POTASSIUM CHLORIDE CRYS ER 20 MEQ PO TBCR
40.0000 meq | EXTENDED_RELEASE_TABLET | Freq: Once | ORAL | Status: AC
  Administered 2022-06-30: 40 meq via ORAL
  Filled 2022-06-30: qty 2

## 2022-06-30 MED ORDER — IOHEXOL 350 MG/ML SOLN
75.0000 mL | Freq: Once | INTRAVENOUS | Status: AC | PRN
  Administered 2022-06-30: 75 mL via INTRAVENOUS

## 2022-06-30 MED ORDER — LOSARTAN POTASSIUM 50 MG PO TABS
50.0000 mg | ORAL_TABLET | Freq: Two times a day (BID) | ORAL | Status: DC
  Administered 2022-06-30 – 2022-07-01 (×2): 50 mg via ORAL
  Filled 2022-06-30 (×2): qty 1

## 2022-06-30 MED ORDER — ENOXAPARIN SODIUM 30 MG/0.3ML IJ SOSY: 30.0000 mg | PREFILLED_SYRINGE | INTRAMUSCULAR | Status: DC

## 2022-06-30 MED ORDER — ENOXAPARIN SODIUM 30 MG/0.3ML IJ SOSY
30.0000 mg | PREFILLED_SYRINGE | INTRAMUSCULAR | Status: DC
  Administered 2022-06-30: 30 mg via SUBCUTANEOUS
  Filled 2022-06-30: qty 0.3

## 2022-06-30 MED ORDER — GUAIFENESIN-DM 100-10 MG/5ML PO SYRP: 10.0000 mL | ORAL_SOLUTION | ORAL | Status: DC | PRN

## 2022-06-30 MED ORDER — MONTELUKAST SODIUM 10 MG PO TABS
10.0000 mg | ORAL_TABLET | Freq: Every day | ORAL | Status: DC
  Administered 2022-06-30: 10 mg via ORAL
  Filled 2022-06-30: qty 1

## 2022-06-30 MED ORDER — PANTOPRAZOLE SODIUM 40 MG PO TBEC
40.0000 mg | DELAYED_RELEASE_TABLET | Freq: Two times a day (BID) | ORAL | Status: DC
  Administered 2022-06-30 – 2022-07-01 (×2): 40 mg via ORAL
  Filled 2022-06-30 (×2): qty 1

## 2022-06-30 MED ORDER — NIRMATRELVIR/RITONAVIR (PAXLOVID)TABLET: 3.0000 | ORAL_TABLET | Freq: Two times a day (BID) | ORAL | Status: DC

## 2022-06-30 MED ORDER — ACETAMINOPHEN 325 MG PO TABS
650.0000 mg | ORAL_TABLET | Freq: Four times a day (QID) | ORAL | Status: DC | PRN
  Administered 2022-06-30: 650 mg via ORAL
  Filled 2022-06-30: qty 2

## 2022-06-30 MED ORDER — ALBUTEROL SULFATE (2.5 MG/3ML) 0.083% IN NEBU: 2.5000 mg | INHALATION_SOLUTION | Freq: Four times a day (QID) | RESPIRATORY_TRACT | Status: DC | PRN

## 2022-06-30 NOTE — ED Notes (Signed)
Patient refusing to go to CT. Notified patient's daughter Caro Laroche and ED provider Dr. Florina Ou.

## 2022-06-30 NOTE — Hospital Course (Addendum)
Michele [name] is a 89 y.o. female with a history of mild intermittent asthma, hypertension, melanoma, GERD, osteoporosis, osteoarthritis, chronic hyponatremia. Patient presented secondary to confusion and found to have COVID-19 infection with hypoxia and without evidence of pneumonia on chest imaging. Paxlovid and steroids started. Supplemental oxygen provided.

## 2022-06-30 NOTE — Progress Notes (Signed)
90- Patient is refusing CT scan, wants to go home. RN Percell Locus aware.

## 2022-06-30 NOTE — Evaluation (Signed)
Physical Therapy Evaluation Patient Details Name: Michele [name] MRN: [id number] DOB: 05/31/1926 Today's Date: 06/30/2022  History of Present Illness  Michele [name] is a 89 y.o. female with medical history significant of mild intermittent asthma, hypertension, melanoma, GERD, osteoporosis, osteoarthritis, chronic hyponatremia presents to the ED from [facility] for evaluation of mild confusion, decreased appetite, and generalized weakness, Positive for Covid, tachycardic.  Clinical Impression  Pt admitted with above diagnosis.  Pt currently with functional limitations due to the deficits listed below (see PT Problem List). Pt will benefit from skilled PT to increase their independence and safety with mobility to allow discharge to the venue listed below.     The patient  found resting in bed, somewhat sluggish to respond, had difficulty with placing HA, O@ out of [place]. Daughter present to provide information.  Patient was  independent at [facility]. Usually did not use AD.  Patient required frequent cueing for safety  during ambulation and toileting.  SPO2 on 2 LPM pre -96 %.  Difficulty getting a reading  while on RA with mobility. Finally  able to get reading but back on 2 LPM at 96%. Per daughter, plans are to return to [place] at [place] and have 24/7 caregivers.      Recommendations for follow up therapy are one component of a multi-disciplinary discharge planning process, led by the attending physician.  Recommendations may be updated based on patient status, additional functional criteria and insurance authorization.  Follow Up Recommendations Home health PT      Assistance Recommended at Discharge Frequent or constant Supervision/Assistance  Patient can return home with the following  A little help with walking and/or transfers;A little help with bathing/dressing/bathroom;Assistance with cooking/housework;Assist for transportation;Help with stairs or ramp for entrance     Equipment Recommendations None recommended by PT  Recommendations for Other Services       Functional Status Assessment Patient has had a recent decline in their functional status and demonstrates the ability to make significant improvements in function in a reasonable and predictable amount of time.     Precautions / Restrictions Precautions Precautions: Fall      Mobility  Bed Mobility Overal bed mobility: Needs Assistance Bed Mobility: Supine to Sit     Supine to sit: Min guard     General bed mobility comments: multimodal cues to  to initiate task    Transfers Overall transfer level: Needs assistance Equipment used: Rolling walker (2 wheels) Transfers: Sit to/from Stand Sit to Stand: Min assist           General transfer comment: from bed and toilet , steady assist to rise decreased control of descent.    Ambulation/Gait Ambulation/Gait assistance: Min assist Gait Distance (Feet): 20 Feet (x 2) Assistive device: Rolling walker (2 wheels) Gait Pattern/deviations: Step-through pattern, Drifts right/left Gait velocity: decr     General Gait Details: multimodal cues  and min assist for RW to  avoid objects, turn around  [employer]            Wheelchair Mobility    Modified Rankin (Stroke Patients Only)       Balance Overall balance assessment: Needs assistance Sitting-balance support: Bilateral upper extremity supported, Feet supported Sitting balance-Leahy Scale: Fair     Standing balance support: Single extremity supported, During functional activity, Reliant on assistive device for balance Standing balance-Leahy Scale: Poor  Pertinent Vitals/Pain Pain Assessment Pain Assessment: No/denies pain    Home Living Family/patient expects to be discharged to::  (independnent living at [facility]) Living Arrangements: Alone Available Help at Discharge: Family;Personal care attendant Type of Home:  Apartment Home Access: Level entry       Home Layout: One level Home Equipment: Conservation officer, nature (2 wheels);Cane - single point Additional Comments: dtr present and provided information    Prior Function Prior Level of Function : Independent/Modified Independent             Mobility Comments: uses a cane or RW       Hand Dominance   Dominant Hand: Right    Extremity/Trunk Assessment        Lower Extremity Assessment Lower Extremity Assessment: Generalized weakness    Cervical / Trunk Assessment Cervical / Trunk Assessment: Kyphotic  Communication   Communication: HOH  Cognition Arousal/Alertness: Awake/alert Behavior During Therapy: WFL for tasks assessed/performed Overall Cognitive Status: Difficult to assess Area of Impairment: Awareness                       Following Commands: Follows one step commands inconsistently Safety/Judgement: Decreased awareness of deficits Awareness: Emergent   General Comments: patient somewaht catatonic, holding HA then dropping it, frequent cues to arouse and stay on task, did not  get close to toilet or recliner before attempting to sit down, even with cues(? not hearing instruction) repeatedly stating that she was going home tomorrow        General Comments      Exercises     Assessment/Plan    PT Assessment Patient needs continued PT services  PT Problem List Decreased strength;Decreased activity tolerance;Decreased mobility;Decreased cognition;Decreased safety awareness;Decreased balance;Decreased knowledge of use of DME;Decreased knowledge of precautions       PT Treatment Interventions DME instruction;Therapeutic activities;Balance training;Cognitive remediation;Gait training;Functional mobility training;Therapeutic exercise;Patient/family education    PT Goals (Current goals can be found in the Care Plan section)  Acute Rehab PT Goals Patient Stated Goal: go home tomorrow PT Goal Formulation: With  patient/family Time For Goal Achievement: 07/14/22 Potential to Achieve Goals: Fair    Frequency Min 3X/week     Co-evaluation PT/OT/SLP Co-Evaluation/Treatment: Yes Reason for Co-Treatment: To address functional/ADL transfers PT goals addressed during session: Mobility/safety with mobility OT goals addressed during session: ADL's and self-care       AM-PAC PT "6 Clicks" Mobility  Outcome Measure Help needed turning from your back to your side while in a flat bed without using bedrails?: None Help needed moving from lying on your back to sitting on the side of a flat bed without using bedrails?: None Help needed moving to and from a bed to a chair (including a wheelchair)?: A Little Help needed standing up from a chair using your arms (e.g., wheelchair or bedside chair)?: A Little Help needed to walk in hospital room?: A Little Help needed climbing 3-5 steps with a railing? : A Lot 6 Click Score: 19    End of Session   Activity Tolerance: Patient tolerated treatment well Patient left: in chair;with chair alarm set;with family/visitor present;with call bell/phone within reach Nurse Communication: Mobility status PT Visit Diagnosis: Unsteadiness on feet (R26.81);Difficulty in walking, not elsewhere classified (R26.2)    Time: 1030-1105 PT Time Calculation (min) (ACUTE ONLY): 35 min   Charges:   PT Evaluation $PT Eval Low Complexity: 1 Low          Tresa Endo PT Acute Rehabilitation Services  Office [phone] Weekend [phone]   Claretha Cooper 06/30/2022, 12:00 PM

## 2022-06-30 NOTE — ED Provider Notes (Signed)
Nursing notes and vitals signs, including pulse oximetry, reviewed.  Summary of this visit's results, reviewed by myself:  EKG:  EKG Interpretation  Date/Time:  Sunday June 29 2022 21:23:51 EST Ventricular Rate:  111 PR Interval:    QRS Duration: 73 QT Interval:  364 QTC Calculation: 441 R Axis:   -48 Text Interpretation: st with pacs vs afib Ventricular premature complex Inferior infarct, old Anterior infarct, old pt is tremulous, so baseline is not ideal. Confirmed by Isla Pence [phone]) on 06/29/2022 11:23:27 PM        Labs:  Results for orders placed or performed during the hospital encounter of 06/29/22 (from the past 24 hour(s))  Urinalysis, Routine w reflex microscopic -Urine, Clean Catch     Status: Abnormal   Collection Time: 06/29/22  9:18 PM  Result Value Ref Range   Color, Urine YELLOW YELLOW   APPearance CLEAR CLEAR   Specific Gravity, Urine 1.012 1.005 - 1.030   pH 6.0 5.0 - 8.0   Glucose, UA 50 (A) NEGATIVE mg/dL   Hgb urine dipstick SMALL (A) NEGATIVE   Bilirubin Urine NEGATIVE NEGATIVE   Ketones, ur 5 (A) NEGATIVE mg/dL   Protein, ur 100 (A) NEGATIVE mg/dL   Nitrite NEGATIVE NEGATIVE   Leukocytes,Ua NEGATIVE NEGATIVE   RBC / HPF 21-50 0 - 5 RBC/hpf   WBC, UA 0-5 0 - 5 WBC/hpf   Bacteria, UA NONE SEEN NONE SEEN   Squamous Epithelial / HPF 0-5 0 - 5 /HPF   Mucus PRESENT    Hyaline Casts, UA PRESENT   Resp panel by RT-PCR (RSV, Flu A&B, Covid) Anterior Nasal Swab     Status: Abnormal   Collection Time: 06/29/22  9:18 PM   Specimen: Anterior Nasal Swab  Result Value Ref Range   SARS Coronavirus 2 by RT PCR POSITIVE (A) NEGATIVE   Influenza A by PCR NEGATIVE NEGATIVE   Influenza B by PCR NEGATIVE NEGATIVE   Resp Syncytial Virus by PCR NEGATIVE NEGATIVE  CBC with Differential     Status: Abnormal   Collection Time: 06/29/22  9:30 PM  Result Value Ref Range   WBC 7.4 4.0 - 10.5 K/uL   RBC 4.15 3.87 - 5.11 MIL/uL   Hemoglobin 13.8 12.0 - 15.0 g/dL    HCT 41.1 36.0 - 46.0 %   MCV 99.0 80.0 - 100.0 fL   MCH 33.3 26.0 - 34.0 pg   MCHC 33.6 30.0 - 36.0 g/dL   RDW 12.7 11.5 - 15.5 %   Platelets 203 150 - 400 K/uL   nRBC 0.0 0.0 - 0.2 %   Neutrophils Relative % 77 %   Neutro Abs 5.7 1.7 - 7.7 K/uL   Lymphocytes Relative 8 %   Lymphs Abs 0.6 (L) 0.7 - 4.0 K/uL   Monocytes Relative 15 %   Monocytes Absolute 1.1 (H) 0.1 - 1.0 K/uL   Eosinophils Relative 0 %   Eosinophils Absolute 0.0 0.0 - 0.5 K/uL   Basophils Relative 0 %   Basophils Absolute 0.0 0.0 - 0.1 K/uL   Immature Granulocytes 0 %   Abs Immature Granulocytes 0.02 0.00 - 0.07 K/uL  Comprehensive metabolic panel     Status: Abnormal   Collection Time: 06/29/22  9:30 PM  Result Value Ref Range   Sodium 131 (L) 135 - 145 mmol/L   Potassium 3.4 (L) 3.5 - 5.1 mmol/L   Chloride 92 (L) 98 - 111 mmol/L   CO2 27 22 - 32 mmol/L   Glucose,  Bld 134 (H) 70 - 99 mg/dL   BUN 16 8 - 23 mg/dL   Creatinine, Ser 0.65 0.44 - 1.00 mg/dL   Calcium 9.0 8.9 - 10.3 mg/dL   Total Protein 6.8 6.5 - 8.1 g/dL   Albumin 4.0 3.5 - 5.0 g/dL   AST 23 15 - 41 U/L   ALT 13 0 - 44 U/L   Alkaline Phosphatase 45 38 - 126 U/L   Total Bilirubin 0.7 0.3 - 1.2 mg/dL   GFR, Estimated >60 >60 mL/min   Anion gap 12 5 - 15  Troponin I (High Sensitivity)     Status: Abnormal   Collection Time: 06/29/22  9:30 PM  Result Value Ref Range   Troponin I (High Sensitivity) 29 (H) <18 ng/L  Lactic acid, plasma     Status: None   Collection Time: 06/29/22  9:30 PM  Result Value Ref Range   Lactic Acid, Venous 1.7 0.5 - 1.9 mmol/L  CBG monitoring, ED     Status: Abnormal   Collection Time: 06/29/22 10:38 PM  Result Value Ref Range   Glucose-Capillary 105 (H) 70 - 99 mg/dL    Imaging Studies: CT Angio Chest PE W and/or Wo Contrast  Result Date: 06/30/2022 CLINICAL DATA:  Pulmonary embolism (PE) suspected, high prob. Weakness, fatigue, hypoxia, COVID positive EXAM: CT ANGIOGRAPHY CHEST WITH CONTRAST TECHNIQUE:  Multidetector CT imaging of the chest was performed using the standard protocol during bolus administration of intravenous contrast. Multiplanar CT image reconstructions and MIPs were obtained to evaluate the vascular anatomy. RADIATION DOSE REDUCTION: This exam was performed according to the departmental dose-optimization program which includes automated exposure control, adjustment of the mA and/or kV according to patient size and/or use of iterative reconstruction technique. CONTRAST:  69m OMNIPAQUE IOHEXOL 350 MG/ML SOLN COMPARISON:  04/08/2016 FINDINGS: Cardiovascular: No filling defects in the pulmonary arteries to suggest pulmonary emboli. Heart is normal size. Aorta is normal caliber. Mediastinum/Nodes: No mediastinal, hilar, or axillary adenopathy. Trachea and esophagus are unremarkable. Large hiatal hernia. Thyroid unremarkable. Lungs/Pleura: No confluent airspace opacities or effusions. Upper Abdomen: No acute findings Musculoskeletal: Chest wall soft tissues are unremarkable. No acute bony abnormality. Review of the MIP images confirms the above findings. IMPRESSION: No evidence of pulmonary embolus. Large hiatal hernia. No acute cardiopulmonary disease Electronically Signed   By: KRolm BaptiseM.D.   On: 06/30/2022 03:13   DG Chest Portable 1 View  Result Date: 06/29/2022 CLINICAL DATA:  Shortness of breath EXAM: PORTABLE CHEST 1 VIEW COMPARISON:  06/01/2021 FINDINGS: Heart and mediastinal contours within normal limits. Large hiatal hernia. No confluent airspace opacities or effusions. Thoracolumbar scoliosis and degenerative changes. No acute bony abnormality. IMPRESSION: No active cardiopulmonary disease. Large hiatal hernia. Electronically Signed   By: KRolm BaptiseM.D.   On: 06/29/2022 21:14      Hien Cunliffe, JJenny Reichmann MD 06/30/22 [phone]

## 2022-06-30 NOTE — Progress Notes (Signed)
PROGRESS NOTE    Michele [name]  YQM:[id number] DOB: Oct 21, 1926 DOA: 06/29/2022 PCP: Burnard Bunting, MD   Brief Narrative: Michele [name] is a 89 y.o. female with a history of mild intermittent asthma, hypertension, melanoma, GERD, osteoporosis, osteoarthritis, chronic hyponatremia. Patient presented secondary to confusion and found to have COVID-19 infection with hypoxia and without evidence of pneumonia on chest imaging. Paxlovid and steroids started. Supplemental oxygen provided.   Assessment and Plan:  Respiratory failure with hypoxia Secondary to COVID-19 infection, although no evidence of pneumonia on imaging. Patient requiring up to 2 L/min of oxygen. -Continue supplemental oxygen -Wean to room air; goal SpO2 > 92%  COVID-19 infection Noted on admission. Complicated by hypoxia. Patient started on Paxlovid and dexamethasone for management. -Continue Paxlovid and dexamethasone  Confusion Mild. Present on admission. Likely related to hypoxia. Resolved.  Chronic hyponatremia Sodium stable.  Elevated troponin Mild and flat trend. Not consistent with ACS.  Hypokalemia Potassium of 3.4 on admission. Resolved with repletion.  Generalized weakness Secondary to acute illness. PT recommending home health PT  Mild intermittent asthma Stable. -Resume home Singulair  Large hiatal hernia Noted on CT imaging. Currently asymptomatic. -Continue Protonix  Primary hypertension -Resume home losartan  GERD -Resume home Protonix   DVT prophylaxis: Lovenox Code Status:   Code Status: DNR Family Communication: Daughter on telephone Disposition Plan: Discharge back to ILF likely in 24 hours hopefully without need for oxygen   Consultants:  None  Procedures:  None  Antimicrobials: Paxlovid    Subjective: Patient reports no dyspnea or chest pain. No concerns today. Hoping to go home in AM.  Objective: BP (!) 135/94 (BP Location: Right Arm)   Pulse 75    Temp (!) 97.5 F (36.4 C) (Oral)   Resp (!) 22   Ht 5' (1.524 m)   Wt 47 kg   LMP 05/26/1997   SpO2 100%   BMI 20.24 kg/m   Examination:  General exam: Appears calm and comfortable Respiratory system: Respiratory effort normal. Gastrointestinal system: Abdomen is non-distended, soft and nontender. Central nervous system: Alert and oriented. Psychiatry: Judgement and insight appear normal. Mood & affect appropriate.    Data Reviewed: I have personally reviewed following labs and imaging studies  CBC Lab Results  Component Value Date   WBC 7.4 06/29/2022   RBC 4.15 06/29/2022   HGB 13.8 06/29/2022   HCT 41.1 06/29/2022   MCV 99.0 06/29/2022   MCH 33.3 06/29/2022   PLT 203 06/29/2022   MCHC 33.6 06/29/2022   RDW 12.7 06/29/2022   LYMPHSABS 0.6 (L) 06/29/2022   MONOABS 1.1 (H) 06/29/2022   EOSABS 0.0 06/29/2022   BASOSABS 0.0 88/91/6945     Last metabolic panel Lab Results  Component Value Date   NA 133 (L) 06/30/2022   K 3.6 06/30/2022   CL 95 (L) 06/30/2022   CO2 28 06/30/2022   BUN 15 06/30/2022   CREATININE 0.83 06/30/2022   GLUCOSE 97 06/30/2022   GFRNONAA >60 06/30/2022   GFRAA >60 08/14/2019   CALCIUM 8.8 (L) 06/30/2022   PHOS 2.3 (L) 06/01/2021   PROT 6.8 06/29/2022   ALBUMIN 4.0 06/29/2022   BILITOT 0.7 06/29/2022   ALKPHOS 45 06/29/2022   AST 23 06/29/2022   ALT 13 06/29/2022   ANIONGAP 10 06/30/2022    GFR: Estimated Creatinine Clearance: 30.4 mL/min (by C-G formula based on SCr of 0.83 mg/dL).  Recent Results (from the past 240 hour(s))  Resp panel by RT-PCR (RSV, Flu A&B, Covid)  Anterior Nasal Swab     Status: Abnormal   Collection Time: 06/29/22  9:18 PM   Specimen: Anterior Nasal Swab  Result Value Ref Range Status   SARS Coronavirus 2 by RT PCR POSITIVE (A) NEGATIVE Final    Comment: (NOTE) SARS-CoV-2 target nucleic acids are DETECTED.  The SARS-CoV-2 RNA is generally detectable in upper respiratory specimens during the acute  phase of infection. Positive results are indicative of the presence of the identified virus, but do not rule out bacterial infection or co-infection with other pathogens not detected by the test. Clinical correlation with patient history and other diagnostic information is necessary to determine patient infection status. The expected result is Negative.  Fact Sheet for Patients: EntrepreneurPulse.com.au  Fact Sheet for Healthcare Providers: IncredibleEmployment.be  This test is not yet approved or cleared by the [place] FDA and  has been authorized for detection and/or diagnosis of SARS-CoV-2 by FDA under an Emergency Use Authorization (EUA).  This EUA will remain in effect (meaning this test can be used) for the duration of  the COVID-19 declaration under Section 564(b)(1) of the A ct, 21 U.S.C. section 360bbb-3(b)(1), unless the authorization is terminated or revoked sooner.     Influenza A by PCR NEGATIVE NEGATIVE Final   Influenza B by PCR NEGATIVE NEGATIVE Final    Comment: (NOTE) The Xpert Xpress SARS-CoV-2/FLU/RSV plus assay is intended as an aid in the diagnosis of influenza from Nasopharyngeal swab specimens and should not be used as a sole basis for treatment. Nasal washings and aspirates are unacceptable for Xpert Xpress SARS-CoV-2/FLU/RSV testing.  Fact Sheet for Patients: EntrepreneurPulse.com.au  Fact Sheet for Healthcare Providers: IncredibleEmployment.be  This test is not yet approved or cleared by the [place] FDA and has been authorized for detection and/or diagnosis of SARS-CoV-2 by FDA under an Emergency Use Authorization (EUA). This EUA will remain in effect (meaning this test can be used) for the duration of the COVID-19 declaration under Section 564(b)(1) of the Act, 21 U.S.C. section 360bbb-3(b)(1), unless the authorization is terminated or revoked.     Resp Syncytial  Virus by PCR NEGATIVE NEGATIVE Final    Comment: (NOTE) Fact Sheet for Patients: EntrepreneurPulse.com.au  Fact Sheet for Healthcare Providers: IncredibleEmployment.be  This test is not yet approved or cleared by the [place] FDA and has been authorized for detection and/or diagnosis of SARS-CoV-2 by FDA under an Emergency Use Authorization (EUA). This EUA will remain in effect (meaning this test can be used) for the duration of the COVID-19 declaration under Section 564(b)(1) of the Act, 21 U.S.C. section 360bbb-3(b)(1), unless the authorization is terminated or revoked.  Performed at [facility], [place] [address], [place], [place] [address]       Radiology Studies: CT Angio Chest PE W and/or Wo Contrast  Result Date: 06/30/2022 CLINICAL DATA:  Pulmonary embolism (PE) suspected, high prob. Weakness, fatigue, hypoxia, COVID positive EXAM: CT ANGIOGRAPHY CHEST WITH CONTRAST TECHNIQUE: Multidetector CT imaging of the chest was performed using the standard protocol during bolus administration of intravenous contrast. Multiplanar CT image reconstructions and MIPs were obtained to evaluate the vascular anatomy. RADIATION DOSE REDUCTION: This exam was performed according to the departmental dose-optimization program which includes automated exposure control, adjustment of the mA and/or kV according to patient size and/or use of iterative reconstruction technique. CONTRAST:  66m OMNIPAQUE IOHEXOL 350 MG/ML SOLN COMPARISON:  04/08/2016 FINDINGS: Cardiovascular: No filling defects in the pulmonary arteries to suggest pulmonary emboli. Heart is normal size. Aorta  is normal caliber. Mediastinum/Nodes: No mediastinal, hilar, or axillary adenopathy. Trachea and esophagus are unremarkable. Large hiatal hernia. Thyroid unremarkable. Lungs/Pleura: No confluent airspace opacities or effusions. Upper Abdomen: No acute findings Musculoskeletal: Chest  wall soft tissues are unremarkable. No acute bony abnormality. Review of the MIP images confirms the above findings. IMPRESSION: No evidence of pulmonary embolus. Large hiatal hernia. No acute cardiopulmonary disease Electronically Signed   By: Rolm Baptise M.D.   On: 06/30/2022 03:13   DG Chest Portable 1 View  Result Date: 06/29/2022 CLINICAL DATA:  Shortness of breath EXAM: PORTABLE CHEST 1 VIEW COMPARISON:  06/01/2021 FINDINGS: Heart and mediastinal contours within normal limits. Large hiatal hernia. No confluent airspace opacities or effusions. Thoracolumbar scoliosis and degenerative changes. No acute bony abnormality. IMPRESSION: No active cardiopulmonary disease. Large hiatal hernia. Electronically Signed   By: Rolm Baptise M.D.   On: 06/29/2022 21:14      LOS: 0 days    Cordelia Poche, MD [facility] 06/30/2022, 3:44 PM   If 7PM-7AM, please contact night-coverage [email or web address]

## 2022-06-30 NOTE — Evaluation (Signed)
Occupational Therapy Evaluation Patient Details Name: Michele [name] MRN: [id number] DOB: 08-15-1926 Today's Date: 06/30/2022   History of Present Illness Michele [name] is a 89 y.o. female with medical history significant of mild intermittent asthma, hypertension, melanoma, GERD, osteoporosis, osteoarthritis, chronic hyponatremia presents to the ED from [facility] for evaluation of mild confusion, decreased appetite, and generalized weakness, Positive for Covid, tachycardic.   Clinical Impression   Patient is a 89 year old female who was admitted for above. Patient was living at [place] with family support. Patient currently is min A for transfers with RW with physical A to ensure completion of transfers onto commode/recliner with patient attempting to sit prior to completion. Patients daughter in room educated on need for 24/7 supervision at home for safety. Patient's daughter reported patient would have whatever level of caregiver assist she needed at time of d/c. Patient was noted to have decreased functional activity tolerance, decreased endurance, decreased standing balance, decreased safety awareness, and decreased knowledge of AD/AE impacting participation in ADLs. Patient would continue to benefit from skilled OT services at this time while admitted and after d/c to address noted deficits in order to improve overall safety and independence in ADLs.        Recommendations for follow up therapy are one component of a multi-disciplinary discharge planning process, led by the attending physician.  Recommendations may be updated based on patient status, additional functional criteria and insurance authorization.   Follow Up Recommendations  Home health OT     Assistance Recommended at Discharge Frequent or constant Supervision/Assistance  Patient can return home with the following A little help with walking and/or transfers;Assistance with cooking/housework;Direct supervision/assist for  medications management;Assist for transportation;Help with stairs or ramp for entrance;A little help with bathing/dressing/bathroom;Direct supervision/assist for financial management    Functional Status Assessment  Patient has had a recent decline in their functional status and demonstrates the ability to make significant improvements in function in a reasonable and predictable amount of time.  Equipment Recommendations  None recommended by OT       Precautions / Restrictions Precautions Precautions: Fall Precaution Comments: monitor O2 Restrictions Weight Bearing Restrictions: No      Mobility Bed Mobility Overal bed mobility: Needs Assistance Bed Mobility: Supine to Sit     Supine to sit: Min guard     General bed mobility comments: multimodal cues to  to initiate task    Transfers Overall transfer level: Needs assistance Equipment used: Rolling walker (2 wheels) Transfers: Sit to/from Stand Sit to Stand: Min assist           General transfer comment: from bed and toilet , steady assist to rise decreased control of descent.      Balance Overall balance assessment: Needs assistance Sitting-balance support: Bilateral upper extremity supported, Feet supported Sitting balance-Leahy Scale: Fair     Standing balance support: Single extremity supported, During functional activity, Reliant on assistive device for balance Standing balance-Leahy Scale: Poor         ADL either performed or assessed with clinical judgement   ADL Overall ADL's : Needs assistance/impaired Eating/Feeding: Set up;Sitting   Grooming: Wash/dry face;Set up;Sitting   Upper Body Bathing: Set up;Sitting   Lower Body Bathing: Minimal assistance;Sit to/from stand;Sitting/lateral leans Lower Body Bathing Details (indicate cue type and reason): was able to complete figure four method Upper Body Dressing : Min guard;Sitting   Lower Body Dressing: Minimal assistance;Sitting/lateral leans;Sit  to/from stand Lower Body Dressing Details (indicate cue type and reason):  patients standing balance impacted ability to pull up underwear Toilet Transfer: Ambulation;Rolling walker (2 wheels);Moderate assistance Toilet Transfer Details (indicate cue type and reason): patient needed physical A to ensure landing on commode and recliner with transfers. patient did nto attend to (or hear?) the cues provided to stand infront of toilet prior to attempting to sit down with patient attempting to sit two steps away from commode and recliner require TD to complete task Toileting- Clothing Manipulation and Hygiene: Sit to/from stand;Min guard Toileting - Clothing Manipulation Details (indicate cue type and reason): with RW              Pertinent Vitals/Pain Pain Assessment Pain Assessment: No/denies pain     Hand Dominance Right   Extremity/Trunk Assessment Upper Extremity Assessment Upper Extremity Assessment: LUE deficits/detail;RUE deficits/detail RUE Coordination: decreased fine motor LUE Deficits / Details: patient was noted to have ROM WFL with patient noted to have poor [facility] with hearing aids. patient was unable to place them in her ears herself but reported she lives at home alone. patient was noted to drop hearing aids x2 with no awareness that they were no longer in between fingers. LUE Coordination: decreased fine motor   Lower Extremity Assessment Lower Extremity Assessment: Defer to PT evaluation   Cervical / Trunk Assessment Cervical / Trunk Assessment: Kyphotic   Communication Communication Communication: HOH   Cognition Arousal/Alertness: Awake/alert Behavior During Therapy: WFL for tasks assessed/performed Overall Cognitive Status: Difficult to assess Area of Impairment: Awareness         Following Commands: Follows one step commands inconsistently Safety/Judgement: Decreased awareness of deficits, Decreased awareness of safety Awareness: Emergent   General  Comments: patient somewaht catatonic, holding HA then dropping it, frequent cues to arouse and stay on task, did not  get close to toilet or recliner before attempting to sit down, even with cues(? not hearing instruction) repeatedly stating that she was going home tomorrow                Home Living Family/patient expects to be discharged to:: Other (Comment) (ILF) Living Arrangements: Alone Available Help at Discharge: Family;Personal care attendant Type of Home: Apartment Home Access: Level entry     Home Layout: One level     Bathroom Shower/Tub: Occupational psychologist: Handicapped height     Home Equipment: Conservation officer, nature (2 wheels);Cane - single point   Additional Comments: dtr present and provided information, daughter reported that they can provide 24/7 caregiver support if needed at time of d/c.      Prior Functioning/Environment Prior Level of Function : Independent/Modified Independent             Mobility Comments: uses a cane or RW          OT Problem List: Decreased activity tolerance;Impaired balance (sitting and/or standing);Decreased coordination;Decreased cognition;Decreased knowledge of precautions;Decreased safety awareness      OT Treatment/Interventions: Self-care/ADL training;Energy conservation;Therapeutic exercise;DME and/or AE instruction;Therapeutic activities;Patient/family education;Balance training    OT Goals(Current goals can be found in the care plan section) Acute Rehab OT Goals Patient Stated Goal: to go home today OT Goal Formulation: With patient/family Time For Goal Achievement: 07/14/22 Potential to Achieve Goals: Fair  OT Frequency: Min 2X/week    Co-evaluation PT/OT/SLP Co-Evaluation/Treatment: Yes Reason for Co-Treatment: To address functional/ADL transfers PT goals addressed during session: Mobility/safety with mobility OT goals addressed during session: ADL's and self-care      AM-PAC OT "6 Clicks"  Daily Activity  Outcome Measure Help from another person eating meals?: None Help from another person taking care of personal grooming?: A Little Help from another person toileting, which includes using toliet, bedpan, or urinal?: A Little Help from another person bathing (including washing, rinsing, drying)?: A Little Help from another person to put on and taking off regular upper body clothing?: A Little Help from another person to put on and taking off regular lower body clothing?: A Little 6 Click Score: 19   End of Session Equipment Utilized During Treatment: Gait belt;Rolling walker (2 wheels) Nurse Communication: Other (comment) (O2 monitor change to earlobe)  Activity Tolerance: Patient tolerated treatment well Patient left: in chair;with call bell/phone within reach;with chair alarm set;with family/visitor present  OT Visit Diagnosis: Unsteadiness on feet (R26.81);Other abnormalities of gait and mobility (R26.89);Muscle weakness (generalized) (M62.81)                Time: 1030-1106 OT Time Calculation (min): 36 min Charges:  OT General Charges $OT Visit: 1 Visit OT Evaluation $OT Eval Moderate Complexity: 1 Mod  Quantel Mcinturff OTR/L, MS Acute Rehabilitation Department Office# [phone]   Willa Rough 06/30/2022, 4:39 PM

## 2022-06-30 NOTE — ED Notes (Signed)
Patient agreeable to getting CTA and is currently being transported to CT.

## 2022-06-30 NOTE — Clinical Note (Incomplete)
89 year old fever with past medical history of asthma/COPD, hypertension, melanoma, GERD, osteoporosis, osteoarthritis, chronic hyponatremia.

## 2022-06-30 NOTE — H&P (Signed)
History and Physical    Michele [name]:[id number] DOB: Jul 15, 1926 DOA: 06/29/2022  PCP: Burnard Bunting, MD  Patient coming from: Home  Chief Complaint: Confusion  HPI: Michele [name] is a 89 y.o. female with medical history significant of mild intermittent asthma, hypertension, melanoma, GERD, osteoporosis, osteoarthritis, chronic hyponatremia presents to the ED from [facility] for evaluation of mild confusion, decreased appetite, and generalized weakness.  In the ED, patient noted to be tachycardic.  Oxygen saturation in the upper 80s on room air and placed on 2 L [place].  Afebrile.  Labs showing no leukocytosis or anemia, sodium 131, potassium 3.4, chloride 92, SARS-CoV-2 PCR positive, high-sensitivity troponin 25> 29, lactic acid 1.7.  UA not suggestive of infection.  CT angiogram chest negative for PE or pneumonia.  Showing large hiatal hernia. Patient was given Zofran and 500 cc normal saline bolus.   Patient is oriented to person and place.  Knows the month is February but does not remember the year.  States she lives in an apartment at [place] and came into the ED as she is not eating and sleeping all the time.  Reports generalized weakness.  Reports chronic dyspnea on exertion and cough due to history of asthma.  Denies fever, chest pain, nausea, vomiting, abdominal pain, diarrhea, or any urinary symptoms.  Patient told me she has daughters and that her husband passed away many years ago.  Told me she traveled a lot when she was younger.  Review of Systems:  Review of Systems  All other systems reviewed and are negative.   Past Medical History:  Diagnosis Date   Aortic atherosclerosis ([place]) 08/13/2019   Asthma    COPD (chronic obstructive pulmonary disease) ([place])    Cystocele 08/2011   Diverticulosis    Fractured rib 2017   GERD (gastroesophageal reflux disease)    Hematuria    GU workup   Hiatal hernia    Hypertension    Melanoma ([place]) 11/2001   Osteoarthritis     Osteoporosis    Scoliosis    UTI (urinary tract infection)    septic    Past Surgical History:  Procedure Laterality Date   HYSTEROSCOPY  06/1998   D&C-Benign   MELANOMA EXCISION       reports that she has never smoked. She has never used smokeless tobacco. She reports that she does not drink alcohol and does not use drugs.  Allergies  Allergen Reactions   Penicillins Other (See Comments)    Reaction unknown Has patient had a PCN reaction causing immediate rash, facial/tongue/throat swelling, SOB or lightheadedness with hypotension: n/a Has patient had a PCN reaction causing severe rash involving mucus membranes or skin necrosis: n/a Has patient had a PCN reaction that required hospitalization: n/a Has patient had a PCN reaction occurring within the last 10 years: n/a If all of the above answers are "NO", then may proceed with Cephalosporin use.    Pneumococcal Vaccines Other (See Comments)    Reaction unknown    Family History  Problem Relation Age of Onset   Hypertension Father    Heart attack Father    Colon cancer Neg Hx     Prior to Admission medications   Medication Sig Start Date End Date Taking? Authorizing Provider  acetaminophen (TYLENOL) 325 MG tablet Take 650 mg by mouth in the morning and at bedtime.   Yes [provider]  feeding supplement (BOOST HIGH PROTEIN) LIQD Take 1 Container by mouth 2 (two) times daily between meals.  Yes [provider]  lidocaine (HM LIDOCAINE PATCH) 4 % Place onto the skin daily.   Yes [provider]  losartan (COZAAR) 50 MG tablet Take 50 mg by mouth 2 (two) times daily.  10/13/14  Yes [provider]  Melatonin 5 MG TABS Take 5 mg by mouth at bedtime as needed (sleep).   Yes [provider]  montelukast (SINGULAIR) 10 MG tablet Take 10 mg by mouth daily.  12/15/14  Yes [provider]  Multiple Vitamin (MULTIVITAMIN WITH MINERALS) TABS Take 1 tablet by mouth daily.   Yes  [provider]  ondansetron (ZOFRAN) 4 MG tablet Take 4 mg by mouth every 8 (eight) hours as needed for nausea or vomiting.   Yes [provider]  pantoprazole (PROTONIX) 40 MG tablet Take 40 mg by mouth 2 (two) times daily before a meal.   Yes [provider]    Physical Exam: Vitals:   06/30/22 0310 06/30/22 0330 06/30/22 0400 06/30/22 0500  BP: (!) 158/108 (!) 152/100 (!) 130/93 (!) 153/97  Pulse: 77 89 77 77  Resp: (!) '23 17 18 '$ (!) 21  Temp:      TempSrc:      SpO2: 97% 91% 93% 97%    Physical Exam Vitals reviewed.  Constitutional:      General: She is not in acute distress. HENT:     Head: Normocephalic and atraumatic.     Mouth/Throat:     Mouth: Mucous membranes are dry.  Eyes:     Extraocular Movements: Extraocular movements intact.     Pupils: Pupils are equal, round, and reactive to light.  Cardiovascular:     Rate and Rhythm: Normal rate and regular rhythm.     Pulses: Normal pulses.  Pulmonary:     Effort: Pulmonary effort is normal. No respiratory distress.     Breath sounds: Normal breath sounds. No wheezing or rales.  Abdominal:     General: Bowel sounds are normal. There is no distension.     Palpations: Abdomen is soft.     Tenderness: There is no abdominal tenderness.  Musculoskeletal:     Cervical back: Normal range of motion.     Right lower leg: No edema.     Left lower leg: No edema.  Skin:    General: Skin is warm and dry.  Neurological:     General: No focal deficit present.     Mental Status: She is alert.     Cranial Nerves: No cranial nerve deficit.     Sensory: No sensory deficit.     Motor: No weakness.     Labs on Admission: I have personally reviewed following labs and imaging studies  CBC: Recent Labs  Lab 06/29/22 2130  WBC 7.4  NEUTROABS 5.7  HGB 13.8  HCT 41.1  MCV 99.0  PLT 102   Basic Metabolic Panel: Recent Labs  Lab 06/29/22 2130  NA 131*  K 3.4*  CL 92*  CO2 27  GLUCOSE 134*   BUN 16  CREATININE 0.65  CALCIUM 9.0   GFR: Estimated Creatinine Clearance: 31.6 mL/min (by C-G formula based on SCr of 0.65 mg/dL). Liver Function Tests: Recent Labs  Lab 06/29/22 2130  AST 23  ALT 13  ALKPHOS 45  BILITOT 0.7  PROT 6.8  ALBUMIN 4.0   No results for input(s): "LIPASE", "AMYLASE" in the last 168 hours. No results for input(s): "AMMONIA" in the last 168 hours. Coagulation Profile: No results for input(s): "INR", "  PROTIME" in the last 168 hours. Cardiac Enzymes: No results for input(s): "CKTOTAL", "CKMB", "CKMBINDEX", "TROPONINI" in the last 168 hours. BNP (last 3 results) No results for input(s): "PROBNP" in the last 8760 hours. HbA1C: No results for input(s): "HGBA1C" in the last 72 hours. CBG: Recent Labs  Lab 06/29/22 2238  GLUCAP 105*   Lipid Profile: No results for input(s): "CHOL", "HDL", "LDLCALC", "TRIG", "CHOLHDL", "LDLDIRECT" in the last 72 hours. Thyroid Function Tests: No results for input(s): "TSH", "T4TOTAL", "FREET4", "T3FREE", "THYROIDAB" in the last 72 hours. Anemia Panel: No results for input(s): "VITAMINB12", "FOLATE", "FERRITIN", "TIBC", "IRON", "RETICCTPCT" in the last 72 hours. Urine analysis:    Component Value Date/Time   COLORURINE YELLOW 06/29/2022 2118   APPEARANCEUR CLEAR 06/29/2022 2118   LABSPEC 1.012 06/29/2022 2118   PHURINE 6.0 06/29/2022 2118   GLUCOSEU 50 (A) 06/29/2022 2118   HGBUR SMALL (A) 06/29/2022 2118   BILIRUBINUR NEGATIVE 06/29/2022 2118   BILIRUBINUR n 08/18/2012 1515   KETONESUR 5 (A) 06/29/2022 2118   PROTEINUR 100 (A) 06/29/2022 2118   UROBILINOGEN 0.2 10/13/2014 0334   NITRITE NEGATIVE 06/29/2022 2118   LEUKOCYTESUR NEGATIVE 06/29/2022 2118    Radiological Exams on Admission: CT Angio Chest PE W and/or Wo Contrast  Result Date: 06/30/2022 CLINICAL DATA:  Pulmonary embolism (PE) suspected, high prob. Weakness, fatigue, hypoxia, COVID positive EXAM: CT ANGIOGRAPHY CHEST WITH CONTRAST TECHNIQUE:  Multidetector CT imaging of the chest was performed using the standard protocol during bolus administration of intravenous contrast. Multiplanar CT image reconstructions and MIPs were obtained to evaluate the vascular anatomy. RADIATION DOSE REDUCTION: This exam was performed according to the departmental dose-optimization program which includes automated exposure control, adjustment of the mA and/or kV according to patient size and/or use of iterative reconstruction technique. CONTRAST:  67m OMNIPAQUE IOHEXOL 350 MG/ML SOLN COMPARISON:  04/08/2016 FINDINGS: Cardiovascular: No filling defects in the pulmonary arteries to suggest pulmonary emboli. Heart is normal size. Aorta is normal caliber. Mediastinum/Nodes: No mediastinal, hilar, or axillary adenopathy. Trachea and esophagus are unremarkable. Large hiatal hernia. Thyroid unremarkable. Lungs/Pleura: No confluent airspace opacities or effusions. Upper Abdomen: No acute findings Musculoskeletal: Chest wall soft tissues are unremarkable. No acute bony abnormality. Review of the MIP images confirms the above findings. IMPRESSION: No evidence of pulmonary embolus. Large hiatal hernia. No acute cardiopulmonary disease Electronically Signed   By: KRolm BaptiseM.D.   On: 06/30/2022 03:13   DG Chest Portable 1 View  Result Date: 06/29/2022 CLINICAL DATA:  Shortness of breath EXAM: PORTABLE CHEST 1 VIEW COMPARISON:  06/01/2021 FINDINGS: Heart and mediastinal contours within normal limits. Large hiatal hernia. No confluent airspace opacities or effusions. Thoracolumbar scoliosis and degenerative changes. No acute bony abnormality. IMPRESSION: No active cardiopulmonary disease. Large hiatal hernia. Electronically Signed   By: KRolm BaptiseM.D.   On: 06/29/2022 21:14    EKG: Independently reviewed. Sinus tachycardia, PACs.  No acute ischemic changes.  Assessment and Plan  Acute hypoxemic respiratory failure COVID-19 infection Tachycardia has resolved and does not  meet any SIRS criteria at this time SARS-CoV-2 PCR positive.  Oxygen saturation in the upper 80s on room air, currently stable on 2 L.  No wheezing or bronchospasm.  CT angiogram chest negative for PE or pneumonia. -Start Paxlovid and Decadron -Antitussive as needed -Airborne and contact precautions -Incentive spirometry, flutter valve -Encourage prone positioning -Continuous pulse ox, supplemental oxygen as needed   Mild confusion Patient is only mildly confused in the setting of acute illness.  Does not  remember the year but otherwise answering questions very appropriately and following commands.  No focal neurodeficit on exam.  Do not think brain imaging is indicated at this time. -Continue to monitor and pursue further workup if her mental status declines  Chronic hyponatremia Sodium 131, at baseline. -Continue gentle IV fluid hydration given dehydration/poor p.o. intake -Monitor BMP  Mild troponin elevation Troponin 25> 29.  ACS is less likely as patient is not endorsing chest pain and EKG is not showing acute ischemic changes. -Cardiac monitoring  Mild hypokalemia -Monitor potassium and magnesium levels, continue to replace as needed  Generalized weakness/physical deconditioning -PT/OT eval, fall precautions  Mild intermittent asthma Stable, no wheezing or bronchospasm. -Albuterol as needed -Continue Singulair after pharmacy med rec is done.  Large hiatal hernia Seen on CT.  Patient is asymptomatic. -Continue PPI after pharmacy med rec is done.  Hypertension GERD -Pharmacy med rec pending  DVT prophylaxis: SCDs Code Status: DNR/DNI (discussed with the patient) Family Communication: No family available at this time. Level of care: Telemetry bed Admission status: It is my clinical opinion that referral for OBSERVATION is reasonable and necessary in this patient based on the above information provided. The aforementioned taken together are felt to place the patient at  high risk for further clinical deterioration. However, it is anticipated that the patient may be medically stable for discharge from the hospital within 24 to 48 hours.   Shela Leff MD [facility]  If 7PM-7AM, please contact night-coverage [email or web address]  06/30/2022, 5:06 AM

## 2022-07-01 DIAGNOSIS — U071 COVID-19: Secondary | ICD-10-CM | POA: Diagnosis not present

## 2022-07-01 MED ORDER — NIRMATRELVIR/RITONAVIR (PAXLOVID)TABLET: 3.0000 | ORAL_TABLET | Freq: Two times a day (BID) | ORAL | 0 refills | Status: DC

## 2022-07-01 MED ORDER — PREDNISONE 20 MG PO TABS: 40.0000 mg | ORAL_TABLET | Freq: Every day | ORAL | 0 refills | Status: DC

## 2022-07-01 NOTE — NC FL2 (Addendum)
[place] LEVEL OF CARE FORM     IDENTIFICATION  Patient Name: Michele [name] Birthdate: 08/04/1926 Sex: female Admission Date (Current Location): 06/29/2022  [facility] and [place] Number:  Herbalist and Address:  [facility],  [address] [place], [place]      Provider Number: [id number]  Attending Physician Name and Address:  Mariel Aloe, MD  Relative Name and Phone Number:  Nakyra, Bourn (Daughter) [phone] [facility])    Current Level of Care: Hospital Recommended Level of Care: [place] Prior Approval Number:    Date Approved/Denied:   PASRR Number: [id number]  Discharge Plan: SNF    Current Diagnoses: Patient Active Problem List   Diagnosis Date Noted   COVID-19 virus infection 06/30/2022   Acute hypoxemic respiratory failure ([place]) 06/30/2022   Confusion 06/30/2022   Hiatal hernia 06/30/2022   Nausea and vomiting 08/13/2019   Elevated lipase 08/13/2019   Dilated cbd, acquired 08/13/2019   Gallbladder hydrops 08/13/2019   Hyponatremia 08/13/2019   Aortic atherosclerosis ([place]) 08/13/2019   Nausea & vomiting 08/13/2019   Constipation 04/28/2016   GERD (gastroesophageal reflux disease) 04/28/2016   Multiple closed fractures of ribs of right side    Hemothorax on right    Leukocytosis    Ribs, multiple fractures 04/08/2016   Cystocele 08/18/2012   Sepsis secondary to UTI ([place]) 04/27/2012   E coli bacteremia 04/27/2012   Elevated troponin 04/27/2012   Essential hypertension 04/27/2012   Asthma 04/27/2012   Nausea 04/27/2012    Orientation RESPIRATION BLADDER Height & Weight     Self, Time, Situation, Place  O2 1L Incontinent Weight: 103 lb 9.9 oz (47 kg) Height:  5' (152.4 cm)  BEHAVIORAL SYMPTOMS/MOOD NEUROLOGICAL BOWEL NUTRITION STATUS      Continent Diet (Heart Healthy)  AMBULATORY STATUS COMMUNICATION OF NEEDS Skin   Limited Assist Verbally Normal                        Personal Care Assistance Level of Assistance  Bathing, Feeding, Dressing Bathing Assistance: Limited assistance Feeding assistance: Independent Dressing Assistance: Limited assistance     Functional Limitations Info  Sight, Hearing, Speech Sight Info: Adequate Hearing Info: Adequate Speech Info: Adequate    SPECIAL CARE FACTORS FREQUENCY  PT (By licensed PT), OT (By licensed OT)     PT Frequency: 5 x a week OT Frequency: 5 x a week            Contractures Contractures Info: Not present    Additional Factors Info  Code Status, Allergies Code Status Info: DNR Allergies Info: Penicillins,Pneumococcal Vaccines           Current Medications (07/01/2022):  This is the current hospital active medication list Current Facility-Administered Medications  Medication Dose Route Frequency Provider Last Rate Last Admin   acetaminophen (TYLENOL) tablet 650 mg  650 mg Oral Q6H PRN Shela Leff, MD   650 mg at 06/30/22 1728   albuterol (PROVENTIL) (2.5 MG/3ML) 0.083% nebulizer solution 2.5 mg  2.5 mg Nebulization Q6H PRN Shela Leff, MD       dexamethasone (DECADRON) injection 6 mg  6 mg Intravenous Q24H Shela Leff, MD   6 mg at 07/01/22 0929   enoxaparin (LOVENOX) injection 30 mg  30 mg Subcutaneous Q24H Mariel Aloe, MD   30 mg at 06/30/22 1721   guaiFENesin-dextromethorphan (ROBITUSSIN DM) 100-10 MG/5ML syrup 10 mL  10 mL Oral Q4H PRN Shela Leff, MD  losartan (COZAAR) tablet 50 mg  50 mg Oral BID Mariel Aloe, MD   50 mg at 07/01/22 0929   montelukast (SINGULAIR) tablet 10 mg  10 mg Oral QHS Mariel Aloe, MD   10 mg at 06/30/22 2056   nirmatrelvir/ritonavir (PAXLOVID) 3 tablet  3 tablet Oral BID Shela Leff, MD   3 tablet at 07/01/22 0931   pantoprazole (PROTONIX) EC tablet 40 mg  40 mg Oral BID AC Mariel Aloe, MD   40 mg at 07/01/22 7530     Discharge Medications: Please see discharge summary for a list of discharge  medications.  Relevant Imaging Results:  Relevant Lab Results:   Additional Information SSN [id number]  [place], LCSW

## 2022-07-01 NOTE — Discharge Instructions (Signed)
Michele [name],  You were in the hospital secondary to COVID-19 infection with associated low oxygen. Thankfully, you have improved with supportive care and some treatment. Please follow-up with your PCP.

## 2022-07-01 NOTE — TOC Transition Note (Addendum)
Transition of Care [facility]) - CM/SW Discharge Note   Patient Details  Name: Michele [name] MRN: [id number] Date of Birth: 03-03-1927  Transition of Care [facility] CM/SW Contact:  Illene Regulus, LCSW Phone Number: 07/01/2022, 11:39 AM   Clinical Narrative:    Pt to return to [facility] for SNF , facility is requesting FL2 and d/c summary. Pt's daughter [name] will transport pt to facility upon d/c.    [place]  1:05pm CSW  spoke with pt's daughter to inform her pt will be needing O2 at d/c. CSW spoke with donna from [facility] , she reported they can provide O2 when she arrives. Pt will need to be transported via [facility] as the facility will provide pt's O2.    Final next level of care: Skilled Nursing Facility Barriers to Discharge: No Barriers Identified   Patient Goals and CMS Choice CMS Medicare.gov Compare Post Acute Care list provided to:: Patient Choice offered to / list presented to : Patient  Discharge Placement                  Patient to be transferred to facility by: susan kelly Name of family member notified: Zaydee, Aina (Daughter) [phone] (Mobile) Patient and family notified of of transfer: 07/01/22  Discharge Plan and Services Additional resources added to the After Visit Summary for                                       Social Determinants of Health (SDOH) Interventions SDOH Screenings   Food Insecurity: No Food Insecurity (07/01/2022)  Housing: Low Risk  (07/01/2022)  Transportation Needs: No Transportation Needs (07/01/2022)  Utilities: Not At Risk (07/01/2022)  Tobacco Use: Low Risk  (06/30/2022)     Readmission Risk Interventions     No data to display

## 2022-07-01 NOTE — Plan of Care (Signed)
  Problem: Education: Goal: Knowledge of risk factors and measures for prevention of condition will improve Outcome: Progressing   Problem: Coping: Goal: Psychosocial and spiritual needs will be supported Outcome: Progressing   Problem: Respiratory: Goal: Will maintain a patent airway Outcome: Progressing   

## 2022-07-01 NOTE — Progress Notes (Addendum)
SATURATION QUALIFICATIONS: (This note is used to comply with regulatory documentation for home oxygen)  Patient Saturations on Room Air at Rest = 96%  Patient Saturations on Room Air while Ambulating = 87%  Patient Saturations on 0 Liters of oxygen while Ambulating = 86%  Please briefly explain why patient needs home oxygen:  Pt recovered to 92% on room 1L while sitting. Pt walked 50 ft.  Michele [name]

## 2022-07-01 NOTE — Discharge Summary (Signed)
Physician Discharge Summary   Patient: Michele [name] MRN: [id number] DOB: 08-10-1926  Admit date:     06/29/2022  Discharge date: 07/01/22  Discharge Physician: Cordelia Poche, MD   PCP: Burnard Bunting, MD   Recommendations at discharge:  PCP follow-up Wean oxygen to room air while ambulating. Currently requiring 1 L/min with ambulation  Discharge Diagnoses: Principal Problem:   COVID-19 virus infection Active Problems:   Elevated troponin   Essential hypertension   Asthma   GERD (gastroesophageal reflux disease)   Hyponatremia   Acute hypoxemic respiratory failure (HCC)   Confusion   Hiatal hernia  Resolved Problems:   * No resolved hospital problems. [facility] Course: Michele [name] is a 89 y.o. female with a history of mild intermittent asthma, hypertension, melanoma, GERD, osteoporosis, osteoarthritis, chronic hyponatremia. Patient presented secondary to confusion and found to have COVID-19 infection with hypoxia and without evidence of pneumonia on chest imaging. Paxlovid and steroids started. Supplemental oxygen provided.  Assessment and Plan:  Respiratory failure with hypoxia Secondary to COVID-19 infection, although no evidence of pneumonia on imaging. Patient requiring up to 2 L/min of oxygen and weaned to room air prior to discharge, but required 1 L/min of oxygen with ambulation.   COVID-19 infection Noted on admission. Complicated by hypoxia. Patient started on Paxlovid and dexamethasone for management. Continue Paxlovid and discharge on Prednisone.   Confusion Mild. Present on admission. Likely related to hypoxia. Resolved.   Chronic hyponatremia Sodium stable.   Elevated troponin Mild and flat trend. Not consistent with ACS.   Hypokalemia Potassium of 3.4 on admission. Resolved with repletion.   Generalized weakness Secondary to acute illness. PT recommending home health PT   Mild intermittent asthma Stable. -Resume home Singulair    Large hiatal hernia Noted on CT imaging. Currently asymptomatic. -Continue Protonix   Primary hypertension -Resume home losartan   GERD -Resume home Protonix  Consultants: None Procedures performed: None  Disposition: Skilled nursing facility Diet recommendation: Regular diet   DISCHARGE MEDICATION: Allergies as of 07/01/2022       Reactions   Penicillins Other (See Comments)   Reaction unknown Has patient had a PCN reaction causing immediate rash, facial/tongue/throat swelling, SOB or lightheadedness with hypotension: n/a Has patient had a PCN reaction causing severe rash involving mucus membranes or skin necrosis: n/a Has patient had a PCN reaction that required hospitalization: n/a Has patient had a PCN reaction occurring within the last 10 years: n/a If all of the above answers are "NO", then may proceed with Cephalosporin use.   Pneumococcal Vaccines Other (See Comments)   Reaction unknown        Medication List     STOP taking these medications    HM Lidocaine Patch 4 % Generic drug: lidocaine       TAKE these medications    acetaminophen 500 MG tablet Commonly known as: TYLENOL Take 1,000 mg by mouth in the morning and at bedtime.   feeding supplement Liqd Take 1 Container by mouth 2 (two) times daily between meals.   losartan 50 MG tablet Commonly known as: COZAAR Take 50 mg by mouth 2 (two) times daily.   montelukast 10 MG tablet Commonly known as: SINGULAIR Take 10 mg by mouth daily.   multivitamin with minerals Tabs tablet Take 1 tablet by mouth daily.   nirmatrelvir/ritonavir 20 x 150 MG & 10 x '100MG'$  Tabs Commonly known as: PAXLOVID Take 3 tablets by mouth 2 (two) times daily for 5 days.  ondansetron 4 MG tablet Commonly known as: ZOFRAN Take 4 mg by mouth every 8 (eight) hours as needed for nausea or vomiting.   pantoprazole 40 MG tablet Commonly known as: PROTONIX Take 40 mg by mouth 2 (two) times daily before a meal.    predniSONE 20 MG tablet Commonly known as: DELTASONE Take 2 tablets (40 mg total) by mouth daily for 3 days. Start taking on: July 02, 2022        Discharge Exam: BP (!) 165/103 (BP Location: Right Arm)   Pulse (!) 57   Temp 97.6 F (36.4 C) (Oral)   Resp 15   Ht 5' (1.524 m)   Wt 47 kg   LMP 05/26/1997   SpO2 95%   BMI 20.24 kg/m   General exam: Appears calm and comfortable Respiratory system: Respiratory effort normal. Central nervous system: Alert and oriented. Psychiatry: Judgement and insight appear normal. Mood & affect appropriate.   Condition at discharge: stable  The results of significant diagnostics from this hospitalization (including imaging, microbiology, ancillary and laboratory) are listed below for reference.   Imaging Studies: CT Angio Chest PE W and/or Wo Contrast  Result Date: 06/30/2022 CLINICAL DATA:  Pulmonary embolism (PE) suspected, high prob. Weakness, fatigue, hypoxia, COVID positive EXAM: CT ANGIOGRAPHY CHEST WITH CONTRAST TECHNIQUE: Multidetector CT imaging of the chest was performed using the standard protocol during bolus administration of intravenous contrast. Multiplanar CT image reconstructions and MIPs were obtained to evaluate the vascular anatomy. RADIATION DOSE REDUCTION: This exam was performed according to the departmental dose-optimization program which includes automated exposure control, adjustment of the mA and/or kV according to patient size and/or use of iterative reconstruction technique. CONTRAST:  69m OMNIPAQUE IOHEXOL 350 MG/ML SOLN COMPARISON:  04/08/2016 FINDINGS: Cardiovascular: No filling defects in the pulmonary arteries to suggest pulmonary emboli. Heart is normal size. Aorta is normal caliber. Mediastinum/Nodes: No mediastinal, hilar, or axillary adenopathy. Trachea and esophagus are unremarkable. Large hiatal hernia. Thyroid unremarkable. Lungs/Pleura: No confluent airspace opacities or effusions. Upper Abdomen: No acute  findings Musculoskeletal: Chest wall soft tissues are unremarkable. No acute bony abnormality. Review of the MIP images confirms the above findings. IMPRESSION: No evidence of pulmonary embolus. Large hiatal hernia. No acute cardiopulmonary disease Electronically Signed   By: KRolm BaptiseM.D.   On: 06/30/2022 03:13   DG Chest Portable 1 View  Result Date: 06/29/2022 CLINICAL DATA:  Shortness of breath EXAM: PORTABLE CHEST 1 VIEW COMPARISON:  06/01/2021 FINDINGS: Heart and mediastinal contours within normal limits. Large hiatal hernia. No confluent airspace opacities or effusions. Thoracolumbar scoliosis and degenerative changes. No acute bony abnormality. IMPRESSION: No active cardiopulmonary disease. Large hiatal hernia. Electronically Signed   By: KRolm BaptiseM.D.   On: 06/29/2022 21:14    Microbiology: Results for orders placed or performed during the hospital encounter of 06/29/22  Resp panel by RT-PCR (RSV, Flu A&B, Covid) Anterior Nasal Swab     Status: Abnormal   Collection Time: 06/29/22  9:18 PM   Specimen: Anterior Nasal Swab  Result Value Ref Range Status   SARS Coronavirus 2 by RT PCR POSITIVE (A) NEGATIVE Final    Comment: (NOTE) SARS-CoV-2 target nucleic acids are DETECTED.  The SARS-CoV-2 RNA is generally detectable in upper respiratory specimens during the acute phase of infection. Positive results are indicative of the presence of the identified virus, but do not rule out bacterial infection or co-infection with other pathogens not detected by the test. Clinical correlation with patient history and other diagnostic  information is necessary to determine patient infection status. The expected result is Negative.  Fact Sheet for Patients: EntrepreneurPulse.com.au  Fact Sheet for Healthcare Providers: IncredibleEmployment.be  This test is not yet approved or cleared by the [place] FDA and  has been authorized for detection and/or  diagnosis of SARS-CoV-2 by FDA under an Emergency Use Authorization (EUA).  This EUA will remain in effect (meaning this test can be used) for the duration of  the COVID-19 declaration under Section 564(b)(1) of the A ct, 21 U.S.C. section 360bbb-3(b)(1), unless the authorization is terminated or revoked sooner.     Influenza A by PCR NEGATIVE NEGATIVE Final   Influenza B by PCR NEGATIVE NEGATIVE Final    Comment: (NOTE) The Xpert Xpress SARS-CoV-2/FLU/RSV plus assay is intended as an aid in the diagnosis of influenza from Nasopharyngeal swab specimens and should not be used as a sole basis for treatment. Nasal washings and aspirates are unacceptable for Xpert Xpress SARS-CoV-2/FLU/RSV testing.  Fact Sheet for Patients: EntrepreneurPulse.com.au  Fact Sheet for Healthcare Providers: IncredibleEmployment.be  This test is not yet approved or cleared by the [place] FDA and has been authorized for detection and/or diagnosis of SARS-CoV-2 by FDA under an Emergency Use Authorization (EUA). This EUA will remain in effect (meaning this test can be used) for the duration of the COVID-19 declaration under Section 564(b)(1) of the Act, 21 U.S.C. section 360bbb-3(b)(1), unless the authorization is terminated or revoked.     Resp Syncytial Virus by PCR NEGATIVE NEGATIVE Final    Comment: (NOTE) Fact Sheet for Patients: EntrepreneurPulse.com.au  Fact Sheet for Healthcare Providers: IncredibleEmployment.be  This test is not yet approved or cleared by the [place] FDA and has been authorized for detection and/or diagnosis of SARS-CoV-2 by FDA under an Emergency Use Authorization (EUA). This EUA will remain in effect (meaning this test can be used) for the duration of the COVID-19 declaration under Section 564(b)(1) of the Act, 21 U.S.C. section 360bbb-3(b)(1), unless the authorization is terminated  or revoked.  Performed at [facility], [place] [address], [place], [place] [address]     Labs: CBC: Recent Labs  Lab 06/29/22 2130  WBC 7.4  NEUTROABS 5.7  HGB 13.8  HCT 41.1  MCV 99.0  PLT 998   Basic Metabolic Panel: Recent Labs  Lab 06/29/22 2130 06/30/22 0848  NA 131* 133*  K 3.4* 3.6  CL 92* 95*  CO2 27 28  GLUCOSE 134* 97  BUN 16 15  CREATININE 0.65 0.83  CALCIUM 9.0 8.8*  MG  --  2.1   Liver Function Tests: Recent Labs  Lab 06/29/22 2130  AST 23  ALT 13  ALKPHOS 45  BILITOT 0.7  PROT 6.8  ALBUMIN 4.0   CBG: Recent Labs  Lab 06/29/22 2238  GLUCAP 105*    Discharge time spent: 35 minutes.  Signed: Cordelia Poche, MD [facility] 07/01/2022

## 2022-07-01 NOTE — Progress Notes (Signed)
Report called and given to receiving RN. RN aware PTAR  has been arranged.  Jerene Pitch

## 2022-07-03 ENCOUNTER — Emergency Department (HOSPITAL_COMMUNITY): Payer: Medicare Other

## 2022-07-03 ENCOUNTER — Encounter (HOSPITAL_COMMUNITY): Payer: Self-pay

## 2022-07-03 ENCOUNTER — Other Ambulatory Visit: Payer: Self-pay

## 2022-07-03 ENCOUNTER — Non-Acute Institutional Stay (SKILLED_NURSING_FACILITY): Payer: Medicare Other | Admitting: Adult Health

## 2022-07-03 ENCOUNTER — Observation Stay (HOSPITAL_COMMUNITY)
Admission: EM | Admit: 2022-07-03 | Discharge: 2022-07-05 | Disposition: A | Discharge status: Skilled Nursing Facility | Payer: Medicare Other | Attending: Family Medicine | Admitting: Family Medicine

## 2022-07-03 ENCOUNTER — Encounter: Payer: Self-pay | Admitting: Adult Health

## 2022-07-03 DIAGNOSIS — U071 COVID-19: Secondary | ICD-10-CM | POA: Diagnosis not present

## 2022-07-03 DIAGNOSIS — Z8616 Personal history of COVID-19: Secondary | ICD-10-CM

## 2022-07-03 DIAGNOSIS — J4520 Mild intermittent asthma, uncomplicated: Secondary | ICD-10-CM

## 2022-07-03 DIAGNOSIS — Z789 Other specified health status: Secondary | ICD-10-CM

## 2022-07-03 DIAGNOSIS — I10 Essential (primary) hypertension: Secondary | ICD-10-CM | POA: Diagnosis not present

## 2022-07-03 DIAGNOSIS — J449 Chronic obstructive pulmonary disease, unspecified: Secondary | ICD-10-CM | POA: Diagnosis not present

## 2022-07-03 DIAGNOSIS — Z79899 Other long term (current) drug therapy: Secondary | ICD-10-CM | POA: Insufficient documentation

## 2022-07-03 DIAGNOSIS — R410 Disorientation, unspecified: Secondary | ICD-10-CM | POA: Insufficient documentation

## 2022-07-03 DIAGNOSIS — I483 Typical atrial flutter: Secondary | ICD-10-CM

## 2022-07-03 DIAGNOSIS — J45909 Unspecified asthma, uncomplicated: Secondary | ICD-10-CM | POA: Diagnosis not present

## 2022-07-03 DIAGNOSIS — E871 Hypo-osmolality and hyponatremia: Secondary | ICD-10-CM

## 2022-07-03 DIAGNOSIS — R0602 Shortness of breath: Secondary | ICD-10-CM | POA: Diagnosis present

## 2022-07-03 DIAGNOSIS — I2693 Single subsegmental pulmonary embolism without acute cor pulmonale: Secondary | ICD-10-CM

## 2022-07-03 DIAGNOSIS — I2699 Other pulmonary embolism without acute cor pulmonale: Secondary | ICD-10-CM

## 2022-07-03 DIAGNOSIS — I471 Supraventricular tachycardia, unspecified: Secondary | ICD-10-CM | POA: Diagnosis not present

## 2022-07-03 DIAGNOSIS — I4892 Unspecified atrial flutter: Secondary | ICD-10-CM | POA: Diagnosis not present

## 2022-07-03 DIAGNOSIS — K449 Diaphragmatic hernia without obstruction or gangrene: Secondary | ICD-10-CM

## 2022-07-03 DIAGNOSIS — J9601 Acute respiratory failure with hypoxia: Secondary | ICD-10-CM

## 2022-07-03 LAB — CBC WITH DIFFERENTIAL/PLATELET
Abs Immature Granulocytes: 0.03 10*3/uL (ref 0.00–0.07)
Basophils Absolute: 0 10*3/uL (ref 0.0–0.1)
Basophils Relative: 0 %
Eosinophils Absolute: 0 10*3/uL (ref 0.0–0.5)
Eosinophils Relative: 0 %
HCT: 42.6 % (ref 36.0–46.0)
Hemoglobin: 15.1 g/dL — ABNORMAL HIGH (ref 12.0–15.0)
Immature Granulocytes: 0 %
Lymphocytes Relative: 7 %
Lymphs Abs: 0.8 10*3/uL (ref 0.7–4.0)
MCH: 34.1 pg — ABNORMAL HIGH (ref 26.0–34.0)
MCHC: 35.4 g/dL (ref 30.0–36.0)
MCV: 96.2 fL (ref 80.0–100.0)
Monocytes Absolute: 1.3 10*3/uL — ABNORMAL HIGH (ref 0.1–1.0)
Monocytes Relative: 12 %
Neutro Abs: 8.6 10*3/uL — ABNORMAL HIGH (ref 1.7–7.7)
Neutrophils Relative %: 81 %
Platelets: 299 10*3/uL (ref 150–400)
RBC: 4.43 MIL/uL (ref 3.87–5.11)
RDW: 13.2 % (ref 11.5–15.5)
WBC: 10.7 10*3/uL — ABNORMAL HIGH (ref 4.0–10.5)
nRBC: 0 % (ref 0.0–0.2)

## 2022-07-03 LAB — COMPREHENSIVE METABOLIC PANEL
ALT: 13 U/L (ref 0–44)
AST: 24 U/L (ref 15–41)
Albumin: 3.3 g/dL — ABNORMAL LOW (ref 3.5–5.0)
Alkaline Phosphatase: 39 U/L (ref 38–126)
Anion gap: 15 (ref 5–15)
BUN: 27 mg/dL — ABNORMAL HIGH (ref 8–23)
CO2: 25 mmol/L (ref 22–32)
Calcium: 8.6 mg/dL — ABNORMAL LOW (ref 8.9–10.3)
Chloride: 94 mmol/L — ABNORMAL LOW (ref 98–111)
Creatinine, Ser: 0.96 mg/dL (ref 0.44–1.00)
GFR, Estimated: 55 mL/min — ABNORMAL LOW (ref >60)
Glucose, Bld: 123 mg/dL — ABNORMAL HIGH (ref 70–99)
Potassium: 3.5 mmol/L (ref 3.5–5.1)
Sodium: 134 mmol/L — ABNORMAL LOW (ref 135–145)
Total Bilirubin: 0.7 mg/dL (ref 0.3–1.2)
Total Protein: 5.9 g/dL — ABNORMAL LOW (ref 6.5–8.1)

## 2022-07-03 LAB — MAGNESIUM: Magnesium: 1.9 mg/dL (ref 1.7–2.4)

## 2022-07-03 MED ORDER — METOPROLOL TARTRATE 25 MG PO TABS
25.0000 mg | ORAL_TABLET | Freq: Two times a day (BID) | ORAL | Status: DC
  Administered 2022-07-04 – 2022-07-05 (×4): 25 mg via ORAL
  Filled 2022-07-03 (×4): qty 1

## 2022-07-03 MED ORDER — METOPROLOL TARTRATE 5 MG/5ML IV SOLN
2.5000 mg | INTRAVENOUS | Status: DC | PRN
  Administered 2022-07-03: 2.5 mg via INTRAVENOUS
  Filled 2022-07-03: qty 5

## 2022-07-03 MED ORDER — APIXABAN 5 MG PO TABS
10.0000 mg | ORAL_TABLET | Freq: Two times a day (BID) | ORAL | Status: DC
  Administered 2022-07-04 (×2): 10 mg via ORAL
  Filled 2022-07-03 (×2): qty 2

## 2022-07-03 MED ORDER — IPRATROPIUM-ALBUTEROL 0.5-2.5 (3) MG/3ML IN SOLN
3.0000 mL | Freq: Once | RESPIRATORY_TRACT | Status: AC
  Administered 2022-07-03: 3 mL via RESPIRATORY_TRACT
  Filled 2022-07-03: qty 3

## 2022-07-03 MED ORDER — APIXABAN 2.5 MG PO TABS: 2.5000 mg | ORAL_TABLET | Freq: Two times a day (BID) | ORAL | Status: DC

## 2022-07-03 MED ORDER — LACTATED RINGERS IV BOLUS
500.0000 mL | Freq: Once | INTRAVENOUS | Status: AC
  Administered 2022-07-03: 500 mL via INTRAVENOUS

## 2022-07-03 MED ORDER — IOHEXOL 350 MG/ML SOLN
60.0000 mL | Freq: Once | INTRAVENOUS | Status: AC | PRN
  Administered 2022-07-03: 60 mL via INTRAVENOUS

## 2022-07-03 NOTE — ED Provider Notes (Signed)
4:13 PM Assumed care of patient from off-going team. For more details, please see note from same day.  In brief, this is a 89 y.o. female w/ COPD/asthma, GERD, HTN, OA, who p/w new onset Aflutter after covid, got out of hospital 2 days ago to SNF. Here w/ likely aflutter 2:1. Feels sort of like she felt when she was discharged from hospital, mildly SOB. HR 154 bpm on arrival, now 120s. Unclear when it started.   Plan/Dispo at time of sign-out & ED Course since sign-out: '[ ]'$  labs, '[ ]'$  CT PE  BP (!) 151/80   Pulse (!) 154   Temp 98 F (36.7 C) (Oral)   Resp (!) 21   Ht 5' (1.524 m)   Wt 44.5 kg   LMP 05/26/1997   SpO2 95%   BMI 19.16 kg/m    ED Course:   Clinical Course as of 07/03/22 2107  Thu Jul 03, 2022  1748 Creatinine: 0.96 Possible AKI, BL 0.6-0.7 [HN]  1748 BUN(!): 27 [HN]  1748 WBC(!): 10.7 [HN]  1748 Hemoglobin(!): 15.1 [HN]  1749 Volume contracted with possible AKI and dry mucous membranes, will give 500 ccLR [HN]  1750 Magnesium: 1.9 [HN]  1852 ECG Heart Rate(!): 133 Decreased with fluids some [HN]  2012 Per radiology, tiny R upper lobe PE, subsegmental, unclear if would be clinically significant for size. Also with trace effusion that wasn't present a few days ago.  [HN]  2015 Will order metoprolol 2.5 mg IV x 3 doses for HR and consult to cardiology.  [HN]  2048 ECG Heart Rate(!): 111 After 2.5 mg IV metoprolol [HN]  2051 D/w cardiology who recommends overnight observation w/ hospitalist [HN]  2106 Patient admitted to hospitalist. [HN]    Clinical Course User Index [HN] Audley Hose, MD    Dispo: Admit ------------------------------- Cindee Lame, MD Emergency Medicine  This note was created using dictation software, which may contain spelling or grammatical errors.   CRITICAL CARE Performed by: Audley Hose Total critical care time: 30 minutes Critical care time was exclusive of separately billable procedures and treating other  patients. Critical care was necessary to treat or prevent imminent or life-threatening deterioration. Critical care was time spent personally by me on the following activities: development of treatment plan with patient and/or surrogate as well as nursing, discussions with consultants, evaluation of patient's response to treatment, examination of patient, obtaining history from patient or surrogate, ordering and performing treatments and interventions, ordering and review of laboratory studies, ordering and review of radiographic studies, pulse oximetry and re-evaluation of patient's condition.    Audley Hose, MD 07/03/22 2108

## 2022-07-03 NOTE — Assessment & Plan Note (Addendum)
-  Test positive on 2/4. Can end isolation after 5 days (tomorrow) as her symptoms are improving.  - Has 2 more days of Paxlovid remaining but symptoms have resolved. Will discontinue since she will need to start anticoagulation.

## 2022-07-03 NOTE — Assessment & Plan Note (Addendum)
-  CTA chest revealing for very small filling defect in subsegmental branch of right upper lobe compatible with small PE. Questionable clinical significance due to its very small size per radiology. Likely provoked by recent COVID-19 infection.  -will initiate Eliquis at '10mg'$  BID for 7 days and then will need to decide if she can transition to her low dose 2.'5mg'$  Eliquis afterwards

## 2022-07-03 NOTE — Assessment & Plan Note (Addendum)
-  newly diagnosed. No hx in the past. Could be provoked by recent COVID-19 and dehydration.  -HR improved from 160 to 90s follow one time IV 2.5 metoprolol -cardiology has evaluated and recommends starting oral metoprolol tartrate '25mg'$  BID  -CHA2DS-VASc score of 4 (age, gender, HTN)- pt is independent and ambulates with walker. No hx of recurrent falls or GI bleed. Cardiology recommends starting low dose Eliquis but given new pulmonary embolism will start '10mg'$  BID for first 7 days. -obtain TSH, echocardiogram

## 2022-07-03 NOTE — ED Notes (Signed)
Patient transported to CT 

## 2022-07-03 NOTE — ED Triage Notes (Signed)
Patient arrives via EMS from [place] Patient is positive for COVID and HR elevated around 150. No complaints of CP or SOB from patient. BP 130/94, 20 G LAC and received 900 mL of NS prior to arrival.

## 2022-07-03 NOTE — ED Notes (Signed)
ED TO INPATIENT HANDOFF REPORT  ED Nurse Name and Phone #: Carron Curie [phone]      S Name/Age/Gender Michele [name] 89 y.o. female Room/Bed: 006C/006C  Code Status   Code Status: Prior  Home/SNF/Other Nursing Home Patient oriented to: self, place, time, and situation Is this baseline? Yes   Triage Complete: Triage complete  Chief Complaint Atrial flutter [facility]) [I48.92]  Triage Note Patient arrives via EMS from [place] Patient is positive for COVID and HR elevated around 150. No complaints of CP or SOB from patient. BP 130/94, 20 G LAC and received 900 mL of NS prior to arrival.   Allergies Allergies  Allergen Reactions   Penicillins Other (See Comments)    Reaction unknown Has patient had a PCN reaction causing immediate rash, facial/tongue/throat swelling, SOB or lightheadedness with hypotension: n/a Has patient had a PCN reaction causing severe rash involving mucus membranes or skin necrosis: n/a Has patient had a PCN reaction that required hospitalization: n/a Has patient had a PCN reaction occurring within the last 10 years: n/a If all of the above answers are "NO", then may proceed with Cephalosporin use.    Pneumococcal Vaccines Other (See Comments)    Reaction unknown    Level of Care/Admitting Diagnosis ED Disposition     ED Disposition  Admit   Condition  --   Comment  Hospital Area: [place] [[id number]]  Level of Care: Telemetry Cardiac [103]  May place patient in observation at [facility] or [place] if equivalent level of care is available:: No  Covid Evaluation: Asymptomatic - no recent exposure (last 10 days) testing not required  Diagnosis: Atrial flutter ([place]) [427.32.ICD-9-CM]  Admitting Physician: Orene Desanctis [[id number]]  Attending Physician: Orene Desanctis [[id number]]          B Medical/Surgery History Past Medical History:  Diagnosis Date   Aortic atherosclerosis ([place]) 08/13/2019   Asthma    COPD (chronic  obstructive pulmonary disease) ([place])    Cystocele 08/2011   Diverticulosis    Fractured rib 2017   GERD (gastroesophageal reflux disease)    Hematuria    GU workup   Hiatal hernia    Hypertension    Melanoma ([place]) 11/2001   Osteoarthritis    Osteoporosis    Scoliosis    UTI (urinary tract infection)    septic   Past Surgical History:  Procedure Laterality Date   HYSTEROSCOPY  06/1998   D&C-Benign   MELANOMA EXCISION       A IV Location/Drains/Wounds Patient Lines/Drains/Airways Status     Active Line/Drains/Airways     Name Placement date Placement time Site Days   Peripheral IV 07/03/22 20 G Left Antecubital 07/03/22  1745  Antecubital  less than 1   External Urinary Catheter 07/01/22  8563  --  2            Intake/Output Last 24 hours No intake or output data in the 24 hours ending 07/03/22 2321  Labs/Imaging Results for orders placed or performed during the hospital encounter of 07/03/22 (from the past 48 hour(s))  CBC with Differential     Status: Abnormal   Collection Time: 07/03/22  3:18 PM  Result Value Ref Range   WBC 10.7 (H) 4.0 - 10.5 K/uL   RBC 4.43 3.87 - 5.11 MIL/uL   Hemoglobin 15.1 (H) 12.0 - 15.0 g/dL   HCT 42.6 36.0 - 46.0 %   MCV 96.2 80.0 - 100.0 fL   MCH 34.1 (  H) 26.0 - 34.0 pg   MCHC 35.4 30.0 - 36.0 g/dL   RDW 13.2 11.5 - 15.5 %   Platelets 299 150 - 400 K/uL   nRBC 0.0 0.0 - 0.2 %   Neutrophils Relative % 81 %   Neutro Abs 8.6 (H) 1.7 - 7.7 K/uL   Lymphocytes Relative 7 %   Lymphs Abs 0.8 0.7 - 4.0 K/uL   Monocytes Relative 12 %   Monocytes Absolute 1.3 (H) 0.1 - 1.0 K/uL   Eosinophils Relative 0 %   Eosinophils Absolute 0.0 0.0 - 0.5 K/uL   Basophils Relative 0 %   Basophils Absolute 0.0 0.0 - 0.1 K/uL   Immature Granulocytes 0 %   Abs Immature Granulocytes 0.03 0.00 - 0.07 K/uL    Comment: Performed at [facility] [address], [place], [place] [address]  Comprehensive metabolic panel     Status: Abnormal    Collection Time: 07/03/22  4:10 PM  Result Value Ref Range   Sodium 134 (L) 135 - 145 mmol/L   Potassium 3.5 3.5 - 5.1 mmol/L   Chloride 94 (L) 98 - 111 mmol/L   CO2 25 22 - 32 mmol/L   Glucose, Bld 123 (H) 70 - 99 mg/dL    Comment: Glucose reference range applies only to samples taken after fasting for at least 8 hours.   BUN 27 (H) 8 - 23 mg/dL   Creatinine, Ser 0.96 0.44 - 1.00 mg/dL   Calcium 8.6 (L) 8.9 - 10.3 mg/dL   Total Protein 5.9 (L) 6.5 - 8.1 g/dL   Albumin 3.3 (L) 3.5 - 5.0 g/dL   AST 24 15 - 41 U/L   ALT 13 0 - 44 U/L   Alkaline Phosphatase 39 38 - 126 U/L   Total Bilirubin 0.7 0.3 - 1.2 mg/dL   GFR, Estimated 55 (L) >60 mL/min    Comment: (NOTE) Calculated using the CKD-EPI Creatinine Equation (2021)    Anion gap 15 5 - 15    Comment: Performed at [facility], [place] [address] [place], [place] [address]  Magnesium     Status: None   Collection Time: 07/03/22  4:10 PM  Result Value Ref Range   Magnesium 1.9 1.7 - 2.4 mg/dL    Comment: Performed at [facility] [address] [place], [place] [address]   CT Angio Chest PE W and/or Wo Contrast  Result Date: 07/03/2022 CLINICAL DATA:  Pulmonary embolism (PE) suspected, high prob. COVID positive, tachycardia EXAM: CT ANGIOGRAPHY CHEST WITH CONTRAST TECHNIQUE: Multidetector CT imaging of the chest was performed using the standard protocol during bolus administration of intravenous contrast. Multiplanar CT image reconstructions and MIPs were obtained to evaluate the vascular anatomy. RADIATION DOSE REDUCTION: This exam was performed according to the departmental dose-optimization program which includes automated exposure control, adjustment of the mA and/or kV according to patient size and/or use of iterative reconstruction technique. CONTRAST:  17m OMNIPAQUE IOHEXOL 350 MG/ML SOLN COMPARISON:  None Available. FINDINGS: Cardiovascular: Very small filling defect in a subsegmental branch of a right upper lobe  pulmonary artery compatible with small pulmonary embolus. No additional filling defects. Heart is mildly enlarged. Aorta normal caliber, tortuous with scattered calcifications. Mediastinum/Nodes: No mediastinal, hilar, or axillary adenopathy. Trachea and esophagus are unremarkable. Large hiatal hernia. Thyroid unremarkable. Lungs/Pleura: Compressive atelectasis in the left lower lobe adjacent to the large hiatal hernia. Trace right pleural effusion with minimal right base atelectasis. Upper Abdomen: Reflux of contrast  into the IVC and hepatic veins suggesting right heart dysfunction. Musculoskeletal: Chest wall soft tissues are unremarkable. No acute bony abnormality. Old right rib fractures. Review of the MIP images confirms the above findings. IMPRESSION: Very small filling defect in a right upper lobe pulmonary arterial subsegmental branch compatible with small pulmonary embolus. This is of questionable clinical significance due to its very small size. Cardiomegaly. Large hiatal hernia. Trace right pleural effusion. Aortic Atherosclerosis (ICD10-I70.0). These results were called by telephone at the time of interpretation on 07/03/2022 at 8:10 pm to provider HAYLEY NAASZ , who verbally acknowledged these results. Electronically Signed   By: Rolm Baptise M.D.   On: 07/03/2022 20:12   DG Chest Portable 1 View  Result Date: 07/03/2022 CLINICAL DATA:  Shortness of breath. EXAM: PORTABLE CHEST 1 VIEW COMPARISON:  06/29/2022 FINDINGS: Lungs are adequately inflated with stable opacification in the left retrocardiac region compatible with known large hiatal hernia. Lungs are otherwise clear. Cardiomediastinal silhouette and remainder of the exam is unchanged. IMPRESSION: 1. No acute findings. 2. Large hiatal hernia. Electronically Signed   By: Marin Olp M.D.   On: 07/03/2022 15:01    Pending Labs Unresulted Labs (From admission, onward)    None       Vitals/Pain Today's Vitals   07/03/22 2115 07/03/22  2130 07/03/22 2200 07/03/22 2210  BP:  (!) 155/108 (!) 159/119   Pulse: 73 94 91   Resp: (!) 25 (!) 31 16   Temp:    98.1 F (36.7 C)  TempSrc:    Oral  SpO2: 93% 93% 94%   Weight:      Height:        Isolation Precautions No active isolations  Medications Medications  metoprolol tartrate (LOPRESSOR) injection 2.5 mg (2.5 mg Intravenous Given 07/03/22 2028)  ipratropium-albuterol (DUONEB) 0.5-2.5 (3) MG/3ML nebulizer solution 3 mL (3 mLs Nebulization Given 07/03/22 1522)  lactated ringers bolus 500 mL (0 mLs Intravenous Stopped 07/03/22 2133)  iohexol (OMNIPAQUE) 350 MG/ML injection 60 mL (60 mLs Intravenous Contrast Given 07/03/22 1946)    Mobility Unknown to me - have not seen her walk and previous RN did not elaborate.     Focused Assessments Cardiac Assessment Handoff:    Lab Results  Component Value Date   TROPONINI <0.30 04/27/2012   No results found for: "DDIMER" Does the Patient currently have chest pain? No    R Recommendations: See Admitting Provider Note  Report given to:   Additional Notes: Pt is sleeping.  Previous RN noted she is A&O x 4 but forgets sometimes.  She is HOH and sleeping since I arrived at 2300.  Pt was just given Metoprolo and HR now 93.

## 2022-07-03 NOTE — Consult Note (Signed)
Cardiology Consultation:   Patient ID: YARIS FERRELL MRN: [id number]; DOB: 1926-11-26  Admit date: 07/03/2022 Date of Consult: 07/03/2022  Primary Care Provider: Burnard Bunting, MD Primary Cardiologist: None  Primary Electrophysiologist:  None    Patient Profile:   DERRIONA BRANSCOM is a 89 y.o. female with a hx of asthma, COPD, GERD, hypertension who is being seen today for the evaluation of atrial flutter at the request of emergency department. History of Present Illness:   Ms. Worland is a 89 year old female with a history of COPD asthma, GERD, HTN, who presents with new onset atrial flutter.  She was recently hospitalized and diagnosed with COVID pneumonia.  She was discharged from the hospital 2 days ago to a skilled nursing facility.  She is having some shortness of breath since discharge and at the facility heart rate was noted to be elevated.  Peak heart rate noted to be in the 150s.  In the emergency department her heart rates have ranged from 100s to 160s, blood pressure 144/109, mild leukocytosis with WBC 10.7.  Potassium 3.5, magnesium 1.9.  Chest x-ray was unremarkable and CT PE protocol noted a very small filling defect in the right upper lobe subsegmental branch that could be a small pulmonary embolism that has questionable clinical significance.  She was given IV metoprolol 2.5 mg with subsequent rate controlled atrial flutter.  Cardiology consulted for ongoing management.  Past Medical History:  Diagnosis Date   Aortic atherosclerosis ([place]) 08/13/2019   Asthma    COPD (chronic obstructive pulmonary disease) ([place])    Cystocele 08/2011   Diverticulosis    Fractured rib 2017   GERD (gastroesophageal reflux disease)    Hematuria    GU workup   Hiatal hernia    Hypertension    Melanoma ([place]) 11/2001   Osteoarthritis    Osteoporosis    Scoliosis    UTI (urinary tract infection)    septic    Past Surgical History:  Procedure Laterality Date   HYSTEROSCOPY   06/1998   D&C-Benign   MELANOMA EXCISION       Home Medications:  Prior to Admission medications   Medication Sig Start Date End Date Taking? Authorizing Provider  acetaminophen (TYLENOL) 500 MG tablet Take 1,000 mg by mouth in the morning and at bedtime.    [provider]  albuterol (VENTOLIN HFA) 108 (90 Base) MCG/ACT inhaler Inhale into the lungs every 6 (six) hours as needed for wheezing or shortness of breath.    [provider]  feeding supplement (BOOST HIGH PROTEIN) LIQD Take 1 Container by mouth 2 (two) times daily between meals.    [provider]  guaiFENesin (ROBITUSSIN) 100 MG/5ML liquid Take 5 mLs by mouth every 4 (four) hours as needed for cough or to loosen phlegm.    [provider]  loperamide (IMODIUM) 2 MG capsule Take by mouth as needed for diarrhea or loose stools.    [provider]  losartan (COZAAR) 50 MG tablet Take 50 mg by mouth 2 (two) times daily.  10/13/14   [provider]  montelukast (SINGULAIR) 10 MG tablet Take 10 mg by mouth daily.  12/15/14   [provider]  Multiple Vitamin (MULTIVITAMIN WITH MINERALS) TABS Take 1 tablet by mouth daily.    [provider]  nirmatrelvir/ritonavir (PAXLOVID) 20 x 150 MG & 10 x '100MG'$  TABS Take 3 tablets by mouth 2 (two) times daily for 5 days. 07/01/22 07/06/22  Mariel Aloe, MD  ondansetron [facility])  4 MG tablet Take 4 mg by mouth every 8 (eight) hours as needed for nausea or vomiting.    [provider]  pantoprazole (PROTONIX) 40 MG tablet Take 40 mg by mouth 2 (two) times daily before a meal.    [provider]  predniSONE (DELTASONE) 20 MG tablet Take 2 tablets (40 mg total) by mouth daily for 3 days. 07/02/22 07/05/22  Mariel Aloe, MD    Inpatient Medications: Scheduled Meds:  Continuous Infusions:  PRN Meds: metoprolol tartrate  Allergies:    Allergies  Allergen Reactions   Penicillins Other (See Comments)    Reaction  unknown Has patient had a PCN reaction causing immediate rash, facial/tongue/throat swelling, SOB or lightheadedness with hypotension: n/a Has patient had a PCN reaction causing severe rash involving mucus membranes or skin necrosis: n/a Has patient had a PCN reaction that required hospitalization: n/a Has patient had a PCN reaction occurring within the last 10 years: n/a If all of the above answers are "NO", then may proceed with Cephalosporin use.    Pneumococcal Vaccines Other (See Comments)    Reaction unknown    Social History:   Social History   Socioeconomic History   Marital status: Widowed    Spouse name: Not on file   Number of children: Not on file   Years of education: Not on file   Highest education level: Not on file  Occupational History   Not on file  Tobacco Use   Smoking status: Never   Smokeless tobacco: Never  Vaping Use   Vaping Use: Never used  Substance and Sexual Activity   Alcohol use: No    Alcohol/week: 0.0 standard drinks of alcohol    Comment: cocktail    Drug use: No   Sexual activity: Never  Other Topics Concern   Not on file  Social History Narrative   Not on file   Social Determinants of Health   Financial Resource Strain: Not on file  Food Insecurity: No Food Insecurity (07/01/2022)   Hunger Vital Sign    Worried About Running Out of Food in the Last Year: Never true    Ran Out of Food in the Last Year: Never true  Transportation Needs: No Transportation Needs (07/01/2022)   PRAPARE - Hydrologist (Medical): No    Lack of Transportation (Non-Medical): No  Physical Activity: Not on file  Stress: Not on file  Social Connections: Not on file  Intimate Partner Violence: Not At Risk (07/01/2022)   Humiliation, Afraid, Rape, and Kick questionnaire    Fear of Current or Ex-Partner: No    Emotionally Abused: No    Physically Abused: No    Sexually Abused: No    Family History:    Family History  Problem  Relation Age of Onset   Hypertension Father    Heart attack Father    Colon cancer Neg Hx      Review of Systems: 12 point review of systems negative unless otherwise noted in the HPI  Physical Exam/Data:   Vitals:   07/03/22 2000 07/03/22 2030 07/03/22 2034 07/03/22 2035  BP:   (!) 144/109   Pulse: (!) 160 (!) 161 (!) 162 100  Resp: (!) '26 19 20 '$ (!) 21  Temp:      TempSrc:      SpO2: 96% 95% 93% 91%  Weight:      Height:       No intake or output data in the  24 hours ending 07/03/22 2111 Filed Weights   07/03/22 1426  Weight: 44.5 kg   Body mass index is 19.16 kg/m.  General: Elderly female in no apparent distress HEENT: normal Neck: no JVD Endocrine:  No thryomegaly Vascular: No carotid bruits; FA pulses 2+ bilaterally without bruits  Cardiac:  normal S1, S2; tachycardia rate and regular; soft systolic murmur Lungs: Coarse sounds throughout Abd: soft, nontender, no hepatomegaly  Ext: no edema Musculoskeletal:  No deformities, BUE and BLE strength normal and equal Skin: warm and dry  Neuro:  CNs 2-12 intact, no focal abnormalities noted Psych:  Normal affect   EKG:  The EKG was personally reviewed and demonstrates: Atrial flutter Telemetry:  Telemetry was personally reviewed and demonstrates: Atrial flutter rate controlled Relevant CV Studies: None  Laboratory Data:  Chemistry Recent Labs  Lab 06/29/22 2130 06/30/22 0848 07/03/22 1610  NA 131* 133* 134*  K 3.4* 3.6 3.5  CL 92* 95* 94*  CO2 '27 28 25  '$ GLUCOSE 134* 97 123*  BUN 16 15 27*  CREATININE 0.65 0.83 0.96  CALCIUM 9.0 8.8* 8.6*  GFRNONAA >60 >60 55*  ANIONGAP '12 10 15    '$ Recent Labs  Lab 06/29/22 2130 07/03/22 1610  PROT 6.8 5.9*  ALBUMIN 4.0 3.3*  AST 23 24  ALT 13 13  ALKPHOS 45 39  BILITOT 0.7 0.7   Hematology Recent Labs  Lab 06/29/22 2130 07/03/22 1518  WBC 7.4 10.7*  RBC 4.15 4.43  HGB 13.8 15.1*  HCT 41.1 42.6  MCV 99.0 96.2  MCH 33.3 34.1*  MCHC 33.6 35.4  RDW  12.7 13.2  PLT 203 299   Cardiac EnzymesNo results for input(s): "TROPONINI" in the last 168 hours. No results for input(s): "TROPIPOC" in the last 168 hours.  BNPNo results for input(s): "BNP", "PROBNP" in the last 168 hours.  DDimer No results for input(s): "DDIMER" in the last 168 hours.  Radiology/Studies:  CT Angio Chest PE W and/or Wo Contrast  Result Date: 07/03/2022 CLINICAL DATA:  Pulmonary embolism (PE) suspected, high prob. COVID positive, tachycardia EXAM: CT ANGIOGRAPHY CHEST WITH CONTRAST TECHNIQUE: Multidetector CT imaging of the chest was performed using the standard protocol during bolus administration of intravenous contrast. Multiplanar CT image reconstructions and MIPs were obtained to evaluate the vascular anatomy. RADIATION DOSE REDUCTION: This exam was performed according to the departmental dose-optimization program which includes automated exposure control, adjustment of the mA and/or kV according to patient size and/or use of iterative reconstruction technique. CONTRAST:  35m OMNIPAQUE IOHEXOL 350 MG/ML SOLN COMPARISON:  None Available. FINDINGS: Cardiovascular: Very small filling defect in a subsegmental branch of a right upper lobe pulmonary artery compatible with small pulmonary embolus. No additional filling defects. Heart is mildly enlarged. Aorta normal caliber, tortuous with scattered calcifications. Mediastinum/Nodes: No mediastinal, hilar, or axillary adenopathy. Trachea and esophagus are unremarkable. Large hiatal hernia. Thyroid unremarkable. Lungs/Pleura: Compressive atelectasis in the left lower lobe adjacent to the large hiatal hernia. Trace right pleural effusion with minimal right base atelectasis. Upper Abdomen: Reflux of contrast into the IVC and hepatic veins suggesting right heart dysfunction. Musculoskeletal: Chest wall soft tissues are unremarkable. No acute bony abnormality. Old right rib fractures. Review of the MIP images confirms the above findings.  IMPRESSION: Very small filling defect in a right upper lobe pulmonary arterial subsegmental branch compatible with small pulmonary embolus. This is of questionable clinical significance due to its very small size. Cardiomegaly. Large hiatal hernia. Trace right pleural effusion. Aortic Atherosclerosis (ICD10-I70.0). These  results were called by telephone at the time of interpretation on 07/03/2022 at 8:10 pm to provider HAYLEY NAASZ , who verbally acknowledged these results. Electronically Signed   By: Rolm Baptise M.D.   On: 07/03/2022 20:12   DG Chest Portable 1 View  Result Date: 07/03/2022 CLINICAL DATA:  Shortness of breath. EXAM: PORTABLE CHEST 1 VIEW COMPARISON:  06/29/2022 FINDINGS: Lungs are adequately inflated with stable opacification in the left retrocardiac region compatible with known large hiatal hernia. Lungs are otherwise clear. Cardiomediastinal silhouette and remainder of the exam is unchanged. IMPRESSION: 1. No acute findings. 2. Large hiatal hernia. Electronically Signed   By: Marin Olp M.D.   On: 07/03/2022 15:01   CT Angio Chest PE W and/or Wo Contrast  Result Date: 06/30/2022 CLINICAL DATA:  Pulmonary embolism (PE) suspected, high prob. Weakness, fatigue, hypoxia, COVID positive EXAM: CT ANGIOGRAPHY CHEST WITH CONTRAST TECHNIQUE: Multidetector CT imaging of the chest was performed using the standard protocol during bolus administration of intravenous contrast. Multiplanar CT image reconstructions and MIPs were obtained to evaluate the vascular anatomy. RADIATION DOSE REDUCTION: This exam was performed according to the departmental dose-optimization program which includes automated exposure control, adjustment of the mA and/or kV according to patient size and/or use of iterative reconstruction technique. CONTRAST:  62m OMNIPAQUE IOHEXOL 350 MG/ML SOLN COMPARISON:  04/08/2016 FINDINGS: Cardiovascular: No filling defects in the pulmonary arteries to suggest pulmonary emboli. Heart is  normal size. Aorta is normal caliber. Mediastinum/Nodes: No mediastinal, hilar, or axillary adenopathy. Trachea and esophagus are unremarkable. Large hiatal hernia. Thyroid unremarkable. Lungs/Pleura: No confluent airspace opacities or effusions. Upper Abdomen: No acute findings Musculoskeletal: Chest wall soft tissues are unremarkable. No acute bony abnormality. Review of the MIP images confirms the above findings. IMPRESSION: No evidence of pulmonary embolus. Large hiatal hernia. No acute cardiopulmonary disease Electronically Signed   By: KRolm BaptiseM.D.   On: 06/30/2022 03:13    Assessment and Plan:   Atrial flutter.  Given her age and difficulty in rate controlling atrial flutter recommend admission for monitoring and ongoing management.  This could be related to recent COVID illness.  Unclear how long this has been going on but no notation of it at last hospitalization.  Would recommend getting an echo tomorrow given it has been 10+ years since she has had one done.  Suggest transitioning to oral metoprolol for now. CHADS2Vasc 4. HASBLED 1;. Would start dose reduced apixaban for anticoagulation. Monitor for now but may need TEE/CV if this does not revert to sinus rhythm on its own (after covid course).  Recommendations: - repeat echo - start oral metoprolol tartrate 25 mg bid  - recommend starting reduced dose apixaban 2.5 mg bid for stroke prevention       For questions or updates, please contact CStephensonPlease consult [email or web address] for contact info under     Signed, DDoyne Keel MD  07/03/2022 9:11 PM

## 2022-07-03 NOTE — Progress Notes (Addendum)
Location:  [facility] Room Number: NO/157/A Place of Service:  SNF [phone]) Provider:  Provider:  Royal Hawthorn, NP  Burnard Bunting, MD  Patient Care Team: Burnard Bunting, MD as PCP - General (Internal Medicine)  Extended Emergency Contact Information Primary Emergency Contact: [facility] Address: [address]          [place], [place] [address] [name] of [place] Phone: [phone] Relation: Daughter Interpreter needed? No Secondary Emergency Contact: Franchot Erichsen [place] of [place] Mobile Phone: [phone] Relation: Daughter  Code Status:  DNR Goals of care: Advanced Directive information    07/01/2022    2:00 AM  Advanced Directives  Type of Advance Directive Out of facility DNR (pink MOST or yellow form)  Does patient want to make changes to medical advance directive? No - Patient declined  Copy of [place] in Chart? Yes - validated most recent copy scanned in chart (See row information)  Pre-existing out of facility DNR order (yellow form or pink MOST form) Yellow form placed in chart (order not valid for inpatient use)     Chief Complaint  Patient presents with   Acute Visit    Patient has covid 19    HPI:  Pt is a 89 y.o. female seen today for an acute visit for f/u after hospitalization for covid.  Ms Doubleday lives in [place] and developed weakness and confusion and came to the ED 06/29/22.  She was tachycardic with 02 sats in the 80s. This improved with 2 liters of oxygen. UA did not show UTI. CT of the chest did not show PE or pna. PCR covid swab was positive. She was discharged with Paxlovid, prednisone, and oxygen. She came to skilled rehab due to increased care needs and monitoring.   During her hospital stay her troponin was 25, 29.  ACS was not deemed to be likely. Had an EKG with ventricular premature complex.   When I enter the room she is appearing slightly short of breath  and sleepy. She is trying to get dressed. She declined help from the nursing staff. Had a large incontinent episode on the bed did not want help with her bed either. Likes to be independent.   Staff report her 02 sat had been in the 90s on room air so the took it off. She is eating small amts which is her baseline.   Past Medical History:  Diagnosis Date   Aortic atherosclerosis ([place]) 08/13/2019   Asthma    COPD (chronic obstructive pulmonary disease) ([place])    Cystocele 08/2011   Diverticulosis    Fractured rib 2017   GERD (gastroesophageal reflux disease)    Hematuria    GU workup   Hiatal hernia    Hypertension    Melanoma ([place]) 11/2001   Osteoarthritis    Osteoporosis    Scoliosis    UTI (urinary tract infection)    septic   Past Surgical History:  Procedure Laterality Date   HYSTEROSCOPY  06/1998   D&C-Benign   MELANOMA EXCISION      Allergies  Allergen Reactions   Penicillins Other (See Comments)    Reaction unknown Has patient had a PCN reaction causing immediate rash, facial/tongue/throat swelling, SOB or lightheadedness with hypotension: n/a Has patient had a PCN reaction causing severe rash involving mucus membranes or skin necrosis: n/a Has patient had a PCN reaction that required hospitalization: n/a Has patient had a PCN reaction occurring within the last 10 years: n/a  If all of the above answers are "NO", then may proceed with Cephalosporin use.    Pneumococcal Vaccines Other (See Comments)    Reaction unknown    Outpatient Encounter Medications as of 07/03/2022  Medication Sig   acetaminophen (TYLENOL) 500 MG tablet Take 1,000 mg by mouth in the morning and at bedtime.   albuterol (VENTOLIN HFA) 108 (90 Base) MCG/ACT inhaler Inhale into the lungs every 6 (six) hours as needed for wheezing or shortness of breath.   feeding supplement (BOOST HIGH PROTEIN) LIQD Take 1 Container by mouth 2 (two) times daily between meals.   guaiFENesin (ROBITUSSIN) 100 MG/5ML  liquid Take 5 mLs by mouth every 4 (four) hours as needed for cough or to loosen phlegm.   loperamide (IMODIUM) 2 MG capsule Take by mouth as needed for diarrhea or loose stools.   losartan (COZAAR) 50 MG tablet Take 50 mg by mouth 2 (two) times daily.    montelukast (SINGULAIR) 10 MG tablet Take 10 mg by mouth daily.    Multiple Vitamin (MULTIVITAMIN WITH MINERALS) TABS Take 1 tablet by mouth daily.   nirmatrelvir/ritonavir (PAXLOVID) 20 x 150 MG & 10 x '100MG'$  TABS Take 3 tablets by mouth 2 (two) times daily for 5 days.   ondansetron (ZOFRAN) 4 MG tablet Take 4 mg by mouth every 8 (eight) hours as needed for nausea or vomiting.   pantoprazole (PROTONIX) 40 MG tablet Take 40 mg by mouth 2 (two) times daily before a meal.   predniSONE (DELTASONE) 20 MG tablet Take 2 tablets (40 mg total) by mouth daily for 3 days.   No facility-administered encounter medications on file as of 07/03/2022.    Review of Systems  Constitutional:  Positive for activity change and fatigue. Negative for appetite change, chills, diaphoresis, fever and unexpected weight change.  HENT:  Negative for congestion.   Respiratory:  Positive for shortness of breath. Negative for cough and wheezing.   Cardiovascular:  Negative for chest pain, palpitations and leg swelling.  Gastrointestinal:  Negative for abdominal distention, abdominal pain, constipation and diarrhea.  Genitourinary:  Negative for difficulty urinating and dysuria.  Musculoskeletal:  Positive for gait problem (uses walker). Negative for arthralgias, back pain, joint swelling and myalgias.  Neurological:  Positive for weakness (general). Negative for dizziness, tremors, seizures, syncope, facial asymmetry, speech difficulty, light-headedness, numbness and headaches.  Psychiatric/Behavioral:  Positive for confusion (mild). Negative for agitation and behavioral problems (declines hygiene help).     Immunization History  Administered Date(s) Administered   Fluad  Quad(high Dose 65+) 02/25/2022   Influenza Split 06/27/2009, 02/11/2010, 03/06/2011, 03/24/2012, 03/03/2013, 03/17/2014   Influenza, Quadrivalent, Recombinant, Inj, Pf 02/27/2018, 02/18/2019, 02/29/2020   Influenza,inj,Quad PF,6+ Mos 03/04/2013, 03/17/2014, 03/19/2015   Influenza-Unspecified 03/08/2016, 03/15/2021   Moderna Sars-Covid-2 Vaccination 04/10/2020, 03/08/2021   Pneumococcal Conjugate-13 11/09/2013   Pneumococcal Polysaccharide-23 06/29/2009   Td 06/29/2009   Td,absorbed, Preservative Free, Adult Use, Lf Unspecified 06/29/2009   Tdap 06/27/2009   Unspecified SARS-COV-2 Vaccination 06/07/2019, 07/05/2019   Zoster, Live 04/14/2012   Pertinent  Health Maintenance Due  Topic Date Due   MAMMOGRAM  07/11/2016   INFLUENZA VACCINE  Completed   DEXA SCAN  Completed      06/02/2021    8:20 PM 06/03/2021   12:00 PM 06/04/2021   12:00 AM 06/04/2021    8:00 AM 06/04/2021    8:40 PM  Fall Risk  (RETIRED) Patient Fall Risk Level High fall risk High fall risk High fall risk High fall risk High  fall risk   Functional Status Survey:    Vitals:   07/03/22 1107  BP: 110/70  Pulse: (!) 160  Resp: 19  Temp: (!) 97.5 F (36.4 C)  SpO2: 95%  Weight: 98 lb 3.2 oz (44.5 kg)  Height: 5' (1.524 m)   Body mass index is 19.18 kg/m. Physical Exam Vitals and nursing note reviewed.  Constitutional:      General: She is not in acute distress.    Appearance: She is ill-appearing. She is not diaphoretic.     Comments: Sleepy but arouses  HENT:     Head: Normocephalic and atraumatic.     Mouth/Throat:     Mouth: Mucous membranes are moist.     Pharynx: Oropharynx is clear.  Eyes:     Conjunctiva/sclera: Conjunctivae normal.     Pupils: Pupils are equal, round, and reactive to light.  Neck:     Vascular: No JVD.  Cardiovascular:     Rate and Rhythm: Regular rhythm. Tachycardia present.     Heart sounds: No murmur heard. Pulmonary:     Effort: No respiratory distress.     Breath  sounds: Normal breath sounds. No wheezing.     Comments: Slight increased wob Abdominal:     General: Bowel sounds are normal. There is no distension.     Palpations: Abdomen is soft.     Tenderness: There is no abdominal tenderness.  Musculoskeletal:     Right lower leg: No edema.     Left lower leg: No edema.  Skin:    General: Skin is warm and dry.     Comments: Rubor to both feet  Neurological:     Mental Status: She is oriented to person, place, and time.  Psychiatric:        Mood and Affect: Mood normal.     Labs reviewed: Recent Labs    06/29/22 2130 06/30/22 0848  NA 131* 133*  K 3.4* 3.6  CL 92* 95*  CO2 27 28  GLUCOSE 134* 97  BUN 16 15  CREATININE 0.65 0.83  CALCIUM 9.0 8.8*  MG  --  2.1   Recent Labs    06/29/22 2130  AST 23  ALT 13  ALKPHOS 45  BILITOT 0.7  PROT 6.8  ALBUMIN 4.0   Recent Labs    06/11/22 0000 06/29/22 2130  WBC 7.5 7.4  NEUTROABS  --  5.7  HGB 12.6 13.8  HCT 37 41.1  MCV  --  99.0  PLT 283 203   No results found for: "TSH" No results found for: "HGBA1C" No results found for: "CHOL", "HDL", "LDLCALC", "LDLDIRECT", "TRIG", "CHOLHDL"  Significant Diagnostic Results in last 30 days:  CT Angio Chest PE W and/or Wo Contrast  Result Date: 06/30/2022 CLINICAL DATA:  Pulmonary embolism (PE) suspected, high prob. Weakness, fatigue, hypoxia, COVID positive EXAM: CT ANGIOGRAPHY CHEST WITH CONTRAST TECHNIQUE: Multidetector CT imaging of the chest was performed using the standard protocol during bolus administration of intravenous contrast. Multiplanar CT image reconstructions and MIPs were obtained to evaluate the vascular anatomy. RADIATION DOSE REDUCTION: This exam was performed according to the departmental dose-optimization program which includes automated exposure control, adjustment of the mA and/or kV according to patient size and/or use of iterative reconstruction technique. CONTRAST:  60m OMNIPAQUE IOHEXOL 350 MG/ML SOLN  COMPARISON:  04/08/2016 FINDINGS: Cardiovascular: No filling defects in the pulmonary arteries to suggest pulmonary emboli. Heart is normal size. Aorta is normal caliber. Mediastinum/Nodes: No mediastinal, hilar, or axillary adenopathy.  Trachea and esophagus are unremarkable. Large hiatal hernia. Thyroid unremarkable. Lungs/Pleura: No confluent airspace opacities or effusions. Upper Abdomen: No acute findings Musculoskeletal: Chest wall soft tissues are unremarkable. No acute bony abnormality. Review of the MIP images confirms the above findings. IMPRESSION: No evidence of pulmonary embolus. Large hiatal hernia. No acute cardiopulmonary disease Electronically Signed   By: Rolm Baptise M.D.   On: 06/30/2022 03:13   DG Chest Portable 1 View  Result Date: 06/29/2022 CLINICAL DATA:  Shortness of breath EXAM: PORTABLE CHEST 1 VIEW COMPARISON:  06/01/2021 FINDINGS: Heart and mediastinal contours within normal limits. Large hiatal hernia. No confluent airspace opacities or effusions. Thoracolumbar scoliosis and degenerative changes. No acute bony abnormality. IMPRESSION: No active cardiopulmonary disease. Large hiatal hernia. Electronically Signed   By: Rolm Baptise M.D.   On: 06/29/2022 21:14    Assessment/Plan  1. Supraventricular tachycardia 12 lead EKG showing SVT rate 169 With lethargy and mild sob Send to the ED   2. COVID-19 virus infection Tested positive on 2/4 On paxlovid and prednisone Had been improving without oxygen  3. Acute hypoxemic respiratory failure (HCC) Mildly sob on exam. Sats 100% on RA Clear lungs   4. Self-care deficit Had been refusing personal care ?underlying [facility] Declined ST eval for cognition in the past.   5. Essential hypertension Controlled  6. Hiatal hernia Continue PPI  7. Mild intermittent asthma without complication On singulair and prn albuterol Not wheezing or having a significant cough  8. Hyponatremia NA 133 Mild   Family/ staff  Communication: Cheri nurse   Labs/tests ordered:  EKG CBC CMP

## 2022-07-03 NOTE — H&P (Incomplete)
History and Physical    Patient: Michele [name] IOM:[id number] DOB: 1927-04-13 DOA: 07/03/2022 DOS: the patient was seen and examined on 07/03/2022 PCP: Burnard Bunting, MD  Patient coming from: SNF  Chief Complaint:  Chief Complaint  Patient presents with  . Palpitations   HPI: Michele [name] is a 89 y.o. female with medical history significant of HTN, osteoporosis, mild intermittent asthma, GERD, chronic hyponatremia who presents with rapid HR.   Pt just admitted and discharged on 2/6 to SNF following confusion and acute hypoxic respiratory due to COVID-19 infection. She was requiring 2L/min oxygen and weaned to room air on discharge but was requiring 1L/min with ambulation.  Nursing staff evaluated her today and did 12 lead EKG reportedly showing SVT with rates to 169 and sent to ED. Pt says she otherwise feels fine. No palpitations. Has had low appetite in facility since food there does not appeal to her.   In the ED,HR in 160s and  she was given dose of IV 2.'5mg'$  metoprolol and rate was slowed to reveal atrial flutter. BP at 150/119.   WBC mildly elevated at 10.7. Hgb appears heme concentrated at 15.1/42  Na of 134, K of 3.5, Creatinine of 0.96. Gap gradually increasing up prior now at 15.   Mg within normal limits.   CTA chest revealing for very small filling defect in subsegmental branch of right upper lobe compatible with small PE. Trace right pleural effusion.   EDP discussed with cardiology who will consult.   Review of Systems: As mentioned in the history of present illness. All other systems reviewed and are negative. Past Medical History:  Diagnosis Date  . Aortic atherosclerosis ([place]) 08/13/2019  . Asthma   . COPD (chronic obstructive pulmonary disease) ([place])   . Cystocele 08/2011  . Diverticulosis   . Fractured rib 2017  . GERD (gastroesophageal reflux disease)   . Hematuria    GU workup  . Hiatal hernia   . Hypertension   . Melanoma ([place]) 11/2001  .  Osteoarthritis   . Osteoporosis   . Scoliosis   . UTI (urinary tract infection)    septic   Past Surgical History:  Procedure Laterality Date  . HYSTEROSCOPY  06/1998   D&C-Benign  . MELANOMA EXCISION     Social History:  reports that she has never smoked. She has never used smokeless tobacco. She reports that she does not drink alcohol and does not use drugs.  Allergies  Allergen Reactions  . Penicillins Other (See Comments)    Reaction unknown Has patient had a PCN reaction causing immediate rash, facial/tongue/throat swelling, SOB or lightheadedness with hypotension: n/a Has patient had a PCN reaction causing severe rash involving mucus membranes or skin necrosis: n/a Has patient had a PCN reaction that required hospitalization: n/a Has patient had a PCN reaction occurring within the last 10 years: n/a If all of the above answers are "NO", then may proceed with Cephalosporin use.   . Pneumococcal Vaccines Other (See Comments)    Reaction unknown    Family History  Problem Relation Age of Onset  . Hypertension Father   . Heart attack Father   . Colon cancer Neg Hx     Prior to Admission medications   Medication Sig Start Date End Date Taking? Authorizing Provider  acetaminophen (TYLENOL) 500 MG tablet Take 1,000 mg by mouth in the morning and at bedtime.    [provider]  albuterol (VENTOLIN HFA) 108 (90 Base) MCG/ACT inhaler Inhale into  the lungs every 6 (six) hours as needed for wheezing or shortness of breath.    [provider]  feeding supplement (BOOST HIGH PROTEIN) LIQD Take 1 Container by mouth 2 (two) times daily between meals.    [provider]  guaiFENesin (ROBITUSSIN) 100 MG/5ML liquid Take 5 mLs by mouth every 4 (four) hours as needed for cough or to loosen phlegm.    [provider]  loperamide (IMODIUM) 2 MG capsule Take by mouth as needed for diarrhea or loose stools.    [provider]  losartan (COZAAR) 50 MG  tablet Take 50 mg by mouth 2 (two) times daily.  10/13/14   [provider]  montelukast (SINGULAIR) 10 MG tablet Take 10 mg by mouth daily.  12/15/14   [provider]  Multiple Vitamin (MULTIVITAMIN WITH MINERALS) TABS Take 1 tablet by mouth daily.    [provider]  nirmatrelvir/ritonavir (PAXLOVID) 20 x 150 MG & 10 x '100MG'$  TABS Take 3 tablets by mouth 2 (two) times daily for 5 days. 07/01/22 07/06/22  Mariel Aloe, MD  ondansetron (ZOFRAN) 4 MG tablet Take 4 mg by mouth every 8 (eight) hours as needed for nausea or vomiting.    [provider]  pantoprazole (PROTONIX) 40 MG tablet Take 40 mg by mouth 2 (two) times daily before a meal.    [provider]  predniSONE (DELTASONE) 20 MG tablet Take 2 tablets (40 mg total) by mouth daily for 3 days. 07/02/22 07/05/22  Mariel Aloe, MD    Physical Exam: Vitals:   07/03/22 2115 07/03/22 2130 07/03/22 2200 07/03/22 2210  BP:  (!) 155/108 (!) 159/119   Pulse: 73 94 91   Resp: (!) 25 (!) 31 16   Temp:    98.1 F (36.7 C)  TempSrc:    Oral  SpO2: 93% 93% 94%   Weight:      Height:       *** Data Reviewed: {Tip this will not be part of the note when signed- Document your independent interpretation of telemetry tracing, EKG, lab, Radiology test or any other diagnostic tests. Add any new diagnostic test ordered today. (Optional):26781} {Results:26384}  Assessment and Plan: No notes have been filed under this hospital service. Service: Hospitalist     Advance Care Planning:   Code Status: Prior ***  Consults: ***  Family Communication: ***  Severity of Illness: {Observation/Inpatient:21159}  Author: Orene Desanctis, DO 07/03/2022 10:59 PM  For on call review [email or web address].

## 2022-07-03 NOTE — ED Provider Notes (Signed)
[facility] Provider Note   CSN: [id number] Arrival date & time: 07/03/22  1413     History  Chief Complaint  Patient presents with   Palpitations    Michele [name] is a 89 y.o. female.   Palpitations Patient presents with shortness of breath.  Has had shortness of breath for a while now.  Recently diagnosed with COVID and discharged from the hospital 2 days ago.  States she feels about the same now as she did then.  Follow-up with nursing home today and found of heart rate about 150.  Does not Nestl feel her heart going fast.  Does feel more fatigued but not more fatigued now than when she was in the hospital.  No history of atrial fibrillation but is somewhat difficult to get a history from her.    Past Medical History:  Diagnosis Date   Aortic atherosclerosis ([place]) 08/13/2019   Asthma    COPD (chronic obstructive pulmonary disease) ([place])    Cystocele 08/2011   Diverticulosis    Fractured rib 2017   GERD (gastroesophageal reflux disease)    Hematuria    GU workup   Hiatal hernia    Hypertension    Melanoma ([place]) 11/2001   Osteoarthritis    Osteoporosis    Scoliosis    UTI (urinary tract infection)    septic    Home Medications Prior to Admission medications   Medication Sig Start Date End Date Taking? Authorizing Provider  acetaminophen (TYLENOL) 500 MG tablet Take 1,000 mg by mouth in the morning and at bedtime.    [provider]  albuterol (VENTOLIN HFA) 108 (90 Base) MCG/ACT inhaler Inhale into the lungs every 6 (six) hours as needed for wheezing or shortness of breath.    [provider]  feeding supplement (BOOST HIGH PROTEIN) LIQD Take 1 Container by mouth 2 (two) times daily between meals.    [provider]  guaiFENesin (ROBITUSSIN) 100 MG/5ML liquid Take 5 mLs by mouth every 4 (four) hours as needed for cough or to loosen phlegm.    [provider]  loperamide (IMODIUM) 2 MG  capsule Take by mouth as needed for diarrhea or loose stools.    [provider]  losartan (COZAAR) 50 MG tablet Take 50 mg by mouth 2 (two) times daily.  10/13/14   [provider]  montelukast (SINGULAIR) 10 MG tablet Take 10 mg by mouth daily.  12/15/14   [provider]  Multiple Vitamin (MULTIVITAMIN WITH MINERALS) TABS Take 1 tablet by mouth daily.    [provider]  nirmatrelvir/ritonavir (PAXLOVID) 20 x 150 MG & 10 x '100MG'$  TABS Take 3 tablets by mouth 2 (two) times daily for 5 days. 07/01/22 07/06/22  Mariel Aloe, MD  ondansetron (ZOFRAN) 4 MG tablet Take 4 mg by mouth every 8 (eight) hours as needed for nausea or vomiting.    [provider]  pantoprazole (PROTONIX) 40 MG tablet Take 40 mg by mouth 2 (two) times daily before a meal.    [provider]  predniSONE (DELTASONE) 20 MG tablet Take 2 tablets (40 mg total) by mouth daily for 3 days. 07/02/22 07/05/22  Mariel Aloe, MD      Allergies    Penicillins and Pneumococcal vaccines    Review of Systems   Review of Systems  Cardiovascular:  Positive for palpitations.    Physical Exam Updated Vital Signs BP (!) 169/134   Pulse Marland Kitchen)  117   Temp 98 F (36.7 C) (Oral)   Resp 20   Ht 5' (1.524 m)   Wt 44.5 kg   LMP 05/26/1997   SpO2 96%   BMI 19.16 kg/m  Physical Exam Vitals and nursing note reviewed.  HENT:     Head: Atraumatic.  Cardiovascular:     Rate and Rhythm: Tachycardia present. Rhythm irregular.  Pulmonary:     Breath sounds: No wheezing or rhonchi.  Abdominal:     Tenderness: There is no abdominal tenderness.  Musculoskeletal:        General: No tenderness.  Skin:    Capillary Refill: Capillary refill takes less than 2 seconds.  Neurological:     Mental Status: She is alert and oriented to person, place, and time.     ED Results / Procedures / Treatments   Labs (all labs ordered are listed, but only abnormal results are displayed) Labs Reviewed   CBC WITH DIFFERENTIAL/PLATELET - Abnormal; Notable for the following components:      Result Value   WBC 10.7 (*)    Hemoglobin 15.1 (*)    MCH 34.1 (*)    Neutro Abs 8.6 (*)    Monocytes Absolute 1.3 (*)    All other components within normal limits  COMPREHENSIVE METABOLIC PANEL  MAGNESIUM    EKG EKG Interpretation  Date/Time:  Thursday July 03 2022 15:21:13 EST Ventricular Rate:  93 PR Interval:    QRS Duration: 75 QT Interval:  317 QTC Calculation: 395 R Axis:   9 Text Interpretation: Atrial flutter Ventricular premature complex Anteroseptal infarct, age indeterminate Minimal ST elevation, inferior leads Confirmed by Davonna Belling [id number]) on 07/03/2022 3:23:56 PM  Radiology DG Chest Portable 1 View  Result Date: 07/03/2022 CLINICAL DATA:  Shortness of breath. EXAM: PORTABLE CHEST 1 VIEW COMPARISON:  06/29/2022 FINDINGS: Lungs are adequately inflated with stable opacification in the left retrocardiac region compatible with known large hiatal hernia. Lungs are otherwise clear. Cardiomediastinal silhouette and remainder of the exam is unchanged. IMPRESSION: 1. No acute findings. 2. Large hiatal hernia. Electronically Signed   By: Marin Olp M.D.   On: 07/03/2022 15:01    Procedures Procedures    Medications Ordered in ED Medications  ipratropium-albuterol (DUONEB) 0.5-2.5 (3) MG/3ML nebulizer solution 3 mL (3 mLs Nebulization Given 07/03/22 1522)    ED Course/ Medical Decision Making/ A&P                             Medical Decision Making Amount and/or Complexity of Data Reviewed Labs: ordered. Radiology: ordered.  Risk Prescription drug management.   Patient with tachycardia.  Recent admission in the hospital for [place].  However now found to be tachycardic.  Initial EKG somewhat difficult to interpret due to artifact.  Has had previous EKGs and the fact that also showed some artifact.  However second EKG today appears more consistent with atrial flutter.  Chadsvasc 5. Rate has improved somewhat but initially rather rapid.  Now more around 1 10-1 15.  However unknown cause.  Could just be secondary to the COVID.  However have to think about pulmonary embolisms.  Did have scan a few days ago that was negative but now may need further evaluation.  Chest x-ray reassuring.  Will get basic blood work.  Not a cardioversion candidate due to unknown time of onset.  Care turned over to Dr Mayra Neer  CRITICAL CARE Performed by: Davonna Belling Total critical care  time: 30 minutes Critical care time was exclusive of separately billable procedures and treating other patients. Critical care was necessary to treat or prevent imminent or life-threatening deterioration. Critical care was time spent personally by me on the following activities: development of treatment plan with patient and/or surrogate as well as nursing, discussions with consultants, evaluation of patient's response to treatment, examination of patient, obtaining history from patient or surrogate, ordering and performing treatments and interventions, ordering and review of laboratory studies, ordering and review of radiographic studies, pulse oximetry and re-evaluation of patient's condition.         Final Clinical Impression(s) / ED Diagnoses Final diagnoses:  Atrial flutter with rapid ventricular response [facility])    Rx / DC Orders ED Discharge Orders     None         Davonna Belling, MD 07/03/22 1528

## 2022-07-03 NOTE — H&P (Addendum)
History and Physical    Patient: Michele [name] [id number] DOB: 1926/08/19 DOA: 07/03/2022 DOS: the patient was seen and examined on 07/04/2022 PCP: Burnard Bunting, MD  Patient coming from: SNF  Chief Complaint:  Chief Complaint  Patient presents with   Palpitations   HPI: Michele [name] is a 89 y.o. female with medical history significant of HTN, osteoporosis, mild intermittent asthma, GERD, chronic hyponatremia who presents with rapid HR.   Pt just admitted and discharged on 2/6 to SNF following confusion and acute hypoxic respiratory due to COVID-19 infection. She was requiring 2L/min oxygen and weaned to room air on discharge but was requiring 1L/min with ambulation.  Nursing staff evaluated her today and did 12 lead EKG reportedly showing SVT with rates to 169 and sent to ED. Pt says she otherwise feels fine. No palpitations. Has had low appetite in facility since food there does not appeal to her.   In the ED,HR in 160s and  she was given dose of IV 2.53m metoprolol and rate was slowed to reveal atrial flutter. BP at 150/119.   WBC mildly elevated at 10.7. Hgb appears heme concentrated at 15.1/42  Na of 134, K of 3.5, Creatinine of 0.96. Gap gradually increasing up prior now at 15.   Mg within normal limits.   CTA chest revealing for very small filling defect in subsegmental branch of right upper lobe compatible with small PE. Trace right pleural effusion.   EDP discussed with cardiology who will consult.   Review of Systems: As mentioned in the history of present illness. All other systems reviewed and are negative. Past Medical History:  Diagnosis Date   [place] 08/13[place]   Asthma    COPD [place] obstructive [place] disease) ([place]    Cystocele 08/2011   Diverticulosis    Fractured rib 2017   GERD (gastroesophageal reflux disease)    Hematuria    GU workup   Hiatal hernia    Hypertension    Melanoma ([place] 11[place]2001   Osteoarthritis     Osteoporosis    Scoliosis    UTI (urinary tract infection)    septic   Past Surgical History:  Procedure Laterality Date   HYSTEROSCOPY  06/1998   D&C-Benign   MELANOMA EXCISION     Social History:  reports that she has never smoked. She has never used smokeless tobacco. She reports that she does not drink alcohol and does not use drugs.  Allergies  Allergen Reactions   Penicillins Other (See Comments)    Reaction unknown Has patient had a PCN reaction causing immediate rash, facial/tongue/throat swelling, SOB or lightheadedness with hypotension: n/a Has patient had a PCN reaction causing severe rash involving mucus membranes or skin necrosis: n/a Has patient had a PCN reaction that required hospitalization: n/a Has patient had a PCN reaction occurring within the last 10 years: n/a If all of the above answers are "NO", then may proceed with Cephalosporin use.    Pneumococcal Vaccines Other (See Comments)    Reaction unknown    Family History  Problem Relation Age of Onset   Hypertension Father    Heart attack Father    Colon cancer Neg Hx     Prior to Admission medications   Medication Sig Start Date End Date Taking? Authorizing Provider  acetaminophen (TYLENOL) 500 MG tablet Take 1,000 mg by mouth in the morning and at bedtime.    [provider]  albuterol (VENTOLIN HFA) 108 (90 Base) MCG/ACT inhaler Inhale into  the lungs every 6 (six) hours as needed for wheezing or shortness of breath.    [provider]  feeding supplement (BOOST HIGH PROTEIN) LIQD Take 1 Container by mouth 2 (two) times daily between meals.    [provider]  guaiFENesin (ROBITUSSIN) 100 MG/5ML liquid Take 5 mLs by mouth every 4 (four) hours as needed for cough or to loosen phlegm.    [provider]  loperamide (IMODIUM) 2 MG capsule Take by mouth as needed for diarrhea or loose stools.    [provider]  losartan (COZAAR) 50 MG tablet Take 50 mg by  mouth 2 (two) times daily.  10/13/14   [provider]  montelukast (SINGULAIR) 10 MG tablet Take 10 mg by mouth daily.  12/15/14   [provider]  Multiple Vitamin (MULTIVITAMIN WITH MINERALS) TABS Take 1 tablet by mouth daily.    [provider]  nirmatrelvir/ritonavir (PAXLOVID) 20 x 150 MG & 10 x 100MG TABS Take 3 tablets by mouth 2 (two) times daily for 5 days. 07/01/22 07/06/22  Mariel Aloe, MD  ondansetron (ZOFRAN) 4 MG tablet Take 4 mg by mouth every 8 (eight) hours as needed for nausea or vomiting.    [provider]  pantoprazole (PROTONIX) 40 MG tablet Take 40 mg by mouth 2 (two) times daily before a meal.    [provider]  predniSONE (DELTASONE) 20 MG tablet Take 2 tablets (40 mg total) by mouth daily for 3 days. 07/02/22 07/05/22  Mariel Aloe, MD    Physical Exam: Vitals:   07/03/22 2210 07/03/22 2330 07/03/22 2345 07/04/22 0001  BP:  (!) 152/110    Pulse:  99    Resp:  16    Temp: 98.1 F (36.7 C)   (!) 97.2 F (36.2 C)  TempSrc: Oral   Axillary  SpO2:   93%   Weight:      Height:       Constitutional: NAD, calm, comfortable, elderly female initially laying flat in bed asleep but awoke easily to voice. Has hearing impairment with hearing aids.  Eyes: lids and conjunctivae normal ENMT: Mucous membranes are moist.  Neck: normal, supple Respiratory: clear to auscultation bilaterally, no wheezing, no crackles. Normal respiratory effort. No accessory muscle use.  Cardiovascular: Irregularly irregular rate and rhythm, no murmurs / rubs / gallops. No extremity edema.  Abdomen: no tenderness, Bowel sounds positive.  Musculoskeletal: no clubbing / cyanosis. No joint deformity upper and lower extremities. Good ROM, no contractures.  Skin: no rashes, lesions, ulcers. No induration Neurologic: CN 2-12 grossly intact. Strength 5/5 in all 4.  Psychiatric: Normal judgment and insight. Alert and oriented x 3. Normal mood. Data  Reviewed:  See HPI  Assessment and Plan: * Atrial flutter ([place]) -newly diagnosed. No hx in the past. Could be provoked by recent COVID-19 and dehydration.  -HR improved from 160 to 90s follow one time IV 2.5 metoprolol -cardiology has evaluated and recommends starting oral metoprolol tartrate 25m BID  -CHA2DS-VASc score of 4 (age, gender, HTN)- pt is independent and ambulates with walker. No hx of recurrent falls or GI bleed. Cardiology recommends starting low dose Eliquis but given new pulmonary embolism will start 178mBID for first 7 days. -obtain TSH, echocardiogram  Ac[place] pulmonary [place]ism ([place]-CTA chest revealing for very small filling defect in subsegmental branch of right upper lobe compatible with small PE. Questionable clinical significance due to its very small size per radiology. Likely provoked by recent COCOVID-61  infection.  -will initiate Eliquis at 64m BID for 7 days and then will need to decide if she can transition to her low dose 2.561mEliquis afterwards   History of COVID-19 -Test positive on 2/4. Can end isolation after 5 days (tomorrow) as her symptoms are improving.  - Has 2 more days of Paxlovid remaining but symptoms have resolved. Will discontinue since she will need to start anticoagulation.  Essential hypertension -continue losartan      Advance Care Planning:   Code Status: DNR   Consults: cardiology  Family Communication: none at bedside  Severity of Illness: The appropriate patient status for this patient is OBSERVATION. Observation status is judged to be reasonable and necessary in order to provide the required intensity of service to ensure the patient's safety. The patient's presenting symptoms, physical exam findings, and initial radiographic and laboratory data in the context of their medical condition is felt to place them at decreased risk for further clinical deterioration. Furthermore, it is anticipated that the patient will be medically  stable for discharge from the hospital within 2 midnights of admission.   Author: ChOrene DesanctisDO 07/04/2022 1:41 AM  For on call review [email or web address]

## 2022-07-04 ENCOUNTER — Observation Stay (HOSPITAL_COMMUNITY): Payer: Medicare Other

## 2022-07-04 ENCOUNTER — Other Ambulatory Visit (HOSPITAL_COMMUNITY): Payer: Self-pay

## 2022-07-04 DIAGNOSIS — I4892 Unspecified atrial flutter: Secondary | ICD-10-CM | POA: Diagnosis not present

## 2022-07-04 LAB — BASIC METABOLIC PANEL
Anion gap: 12 (ref 5–15)
BUN: 20 mg/dL (ref 8–23)
CO2: 27 mmol/L (ref 22–32)
Calcium: 8.5 mg/dL — ABNORMAL LOW (ref 8.9–10.3)
Chloride: 91 mmol/L — ABNORMAL LOW (ref 98–111)
Creatinine, Ser: 0.81 mg/dL (ref 0.44–1.00)
GFR, Estimated: >60 mL/min (ref >60)
Glucose, Bld: 198 mg/dL — ABNORMAL HIGH (ref 70–99)
Potassium: 3.6 mmol/L (ref 3.5–5.1)
Sodium: 130 mmol/L — ABNORMAL LOW (ref 135–145)

## 2022-07-04 LAB — CBC
HCT: 40.6 % (ref 36.0–46.0)
Hemoglobin: 14.1 g/dL (ref 12.0–15.0)
MCH: 33.3 pg (ref 26.0–34.0)
MCHC: 34.7 g/dL (ref 30.0–36.0)
MCV: 95.8 fL (ref 80.0–100.0)
Platelets: 271 10*3/uL (ref 150–400)
RBC: 4.24 MIL/uL (ref 3.87–5.11)
RDW: 12.5 % (ref 11.5–15.5)
WBC: 7.8 10*3/uL (ref 4.0–10.5)
nRBC: 0 % (ref 0.0–0.2)

## 2022-07-04 LAB — T4, FREE: Free T4: 1.09 ng/dL (ref 0.61–1.12)

## 2022-07-04 LAB — TSH: TSH: 0.292 u[IU]/mL — ABNORMAL LOW (ref 0.350–4.500)

## 2022-07-04 MED ORDER — ONDANSETRON HCL 4 MG/2ML IJ SOLN: 4.0000 mg | Freq: Four times a day (QID) | INTRAMUSCULAR | Status: DC | PRN

## 2022-07-04 MED ORDER — GUAIFENESIN 100 MG/5ML PO LIQD: 5.0000 mL | ORAL | Status: DC | PRN

## 2022-07-04 MED ORDER — ACETAMINOPHEN 500 MG PO TABS
1000.0000 mg | ORAL_TABLET | Freq: Two times a day (BID) | ORAL | Status: DC
  Administered 2022-07-04 – 2022-07-05 (×4): 1000 mg via ORAL
  Filled 2022-07-04 (×4): qty 2

## 2022-07-04 MED ORDER — BOOST HIGH PROTEIN PO LIQD
1.0000 | Freq: Two times a day (BID) | ORAL | Status: DC
  Administered 2022-07-04 – 2022-07-05 (×4): 237 mL via ORAL
  Filled 2022-07-04 (×4): qty 237

## 2022-07-04 MED ORDER — SENNOSIDES-DOCUSATE SODIUM 8.6-50 MG PO TABS: 1.0000 | ORAL_TABLET | Freq: Every evening | ORAL | Status: DC | PRN

## 2022-07-04 MED ORDER — LOSARTAN POTASSIUM 50 MG PO TABS
50.0000 mg | ORAL_TABLET | Freq: Two times a day (BID) | ORAL | Status: DC
  Administered 2022-07-04 – 2022-07-05 (×4): 50 mg via ORAL
  Filled 2022-07-04 (×4): qty 1

## 2022-07-04 MED ORDER — APIXABAN 5 MG PO TABS: 5.0000 mg | ORAL_TABLET | Freq: Two times a day (BID) | ORAL | Status: DC

## 2022-07-04 MED ORDER — IPRATROPIUM-ALBUTEROL 0.5-2.5 (3) MG/3ML IN SOLN: 3.0000 mL | RESPIRATORY_TRACT | Status: DC | PRN

## 2022-07-04 MED ORDER — TRAZODONE HCL 50 MG PO TABS: 50.0000 mg | ORAL_TABLET | Freq: Every evening | ORAL | Status: DC | PRN

## 2022-07-04 MED ORDER — NIRMATRELVIR/RITONAVIR (PAXLOVID)TABLET: 3.0000 | ORAL_TABLET | Freq: Two times a day (BID) | ORAL | Status: DC

## 2022-07-04 MED ORDER — OLANZAPINE 10 MG IM SOLR
5.0000 mg | Freq: Once | INTRAMUSCULAR | Status: DC
  Filled 2022-07-04: qty 10

## 2022-07-04 MED ORDER — OLANZAPINE 5 MG PO TABS
5.0000 mg | ORAL_TABLET | Freq: Once | ORAL | Status: AC
  Administered 2022-07-04: 5 mg via ORAL
  Filled 2022-07-04: qty 1

## 2022-07-04 MED ORDER — MONTELUKAST SODIUM 10 MG PO TABS
10.0000 mg | ORAL_TABLET | Freq: Every day | ORAL | Status: DC
  Administered 2022-07-04 – 2022-07-05 (×2): 10 mg via ORAL
  Filled 2022-07-04 (×2): qty 1

## 2022-07-04 MED ORDER — ACETAMINOPHEN 325 MG PO TABS: 650.0000 mg | ORAL_TABLET | Freq: Four times a day (QID) | ORAL | Status: DC | PRN

## 2022-07-04 MED ORDER — HYDRALAZINE HCL 20 MG/ML IJ SOLN: 10.0000 mg | INTRAMUSCULAR | Status: DC | PRN

## 2022-07-04 MED ORDER — METOPROLOL TARTRATE 5 MG/5ML IV SOLN: 5.0000 mg | INTRAVENOUS | Status: DC | PRN

## 2022-07-04 MED ORDER — APIXABAN 5 MG PO TABS
10.0000 mg | ORAL_TABLET | Freq: Two times a day (BID) | ORAL | Status: DC
  Administered 2022-07-04 – 2022-07-05 (×2): 10 mg via ORAL
  Filled 2022-07-04 (×2): qty 2

## 2022-07-04 MED ORDER — PANTOPRAZOLE SODIUM 40 MG PO TBEC
40.0000 mg | DELAYED_RELEASE_TABLET | Freq: Two times a day (BID) | ORAL | Status: DC
  Administered 2022-07-04 – 2022-07-05 (×3): 40 mg via ORAL
  Filled 2022-07-04 (×3): qty 1

## 2022-07-04 NOTE — TOC Benefit Eligibility Note (Signed)
Patient Advocate Encounter  Insurance verification completed.    The patient is currently admitted and upon discharge could be taking Eliquis 5 mg.  The current 30 day co-pay is $47.00.   The patient is insured through [employer] Medicare Part D   Karyss Frese, CPHT Pharmacy Patient Advocate Specialist [facility] Patient Advocate Team Direct Number: [phone]  Fax: [phone]       

## 2022-07-04 NOTE — Progress Notes (Signed)
PROGRESS NOTE    Michele [name]  [id number] DOB: 1927/03/21 DOA: 07/03/2022 PCP: Burnard Bunting, MD   Brief Narrative:  89 year old with history of HTN, osteoporosis, asthma, GERD, chronic hyponatremia admitted for tachycardia.  She was admitted and discharged on 2/6 to SNF following evaluation of confusion and acute hypoxia secondary to COVID-19 infection.  Nursing staff at the facility noted she was tachycardic with heart rate almost as high as 170.  In the ED she was noted to have a flutter.  CTA chest showed small segmental defect in the right upper lobe consistent with possible small pulmonary embolism.  Cardiology was consulted by EDP   Assessment & Plan:  Principal Problem:   Atrial flutter ([place]) Active Problems:   Essential hypertension   History of COVID-19   Acute pulmonary embolism (HCC)     Assessment and Plan: * Atrial flutter ([place]) -This appears to be new diagnosis.  I suspect due to underlying pulmonary issues and COVID-19 illness.  Echocardiogram has been ordered.  P.o. metoprolol started, adjust as necessary.  Recommended low-dose Eliquis TSH low, check free T4  Acute pulmonary embolism ([place]), small right upper lobe -Without cor pulmonale.  CTA showing small right upper lobe PE likely provoked by COVID-19 infection.  Currently on Eliquis. LE Dopplers ordered.   History of COVID-19 -Initially tested positive on 2/4.  Overall she is asymptomatic.  Still had 2 more days of oral Paxlovid but discontinued  Essential hypertension -continue losartan  GERD History of large hiatal hernia - PPI  Essential hypertension - Losartan, now on beta-blockers.  IV as needed  History of asthma - As needed bronchodilators  Significant weakness.  Get PT OT  DVT prophylaxis: On Eliquis Code Status: DNR Family Communication:  Spoke with the daughters  Overall feels very weak.   Subjective: Sitting up on the recliner, no complaints.   Examination:  General  exam: Appears calm and comfortable. Elderly frail.  Respiratory system: Clear to auscultation. Respiratory effort normal. Cardiovascular system: Irregular irregular.  Gastrointestinal system: Abdomen is nondistended, soft and nontender. No organomegaly or masses felt. Normal bowel sounds heard. Central nervous system: Alert and oriented. No focal neurological deficits. Extremities: Symmetric 4 x 5 power. Skin: No rashes, lesions or ulcers Psychiatry: Judgement and insight appear Poor.    Objective: Vitals:   07/04/22 0020 07/04/22 0148 07/04/22 0245 07/04/22 0310  BP: (!) 162/131 (!) 149/114 (!) 157/123 (!) 142/98  Pulse: (!) 43 89  70  Resp:   18 20  Temp:   97.7 F (36.5 C) 97.7 F (36.5 C)  TempSrc:   Axillary Axillary  SpO2: 96% 95%  96%  Weight:    46 kg  Height:       No intake or output data in the 24 hours ending 07/04/22 0836 Filed Weights   07/03/22 1426 07/04/22 0310  Weight: 44.5 kg 46 kg     Data Reviewed:   CBC: Recent Labs  Lab 06/29/22 2130 07/03/22 1518 07/04/22 0220  WBC 7.4 10.7* 7.8  NEUTROABS 5.7 8.6*  --   HGB 13.8 15.1* 14.1  HCT 41.1 42.6 40.6  MCV 99.0 96.2 95.8  PLT 203 299 [id number]   Basic Metabolic Panel: Recent Labs  Lab 06/29/22 2130 06/30/22 0848 07/03/22 1610 07/04/22 0220  NA 131* 133* 134* 130*  K 3.4* 3.6 3.5 3.6  CL 92* 95* 94* 91*  CO2 27 28 25 27  $ GLUCOSE 134* 97 123* 198*  BUN 16 15 27* 20  CREATININE 0.65  0.83 0.96 0.81  CALCIUM 9.0 8.8* 8.6* 8.5*  MG  --  2.1 1.9  --    GFR: Estimated Creatinine Clearance: 31.2 mL/min (by C-G formula based on SCr of 0.81 mg/dL). Liver Function Tests: Recent Labs  Lab 06/29/22 2130 07/03/22 1610  AST 23 24  ALT 13 13  ALKPHOS 45 39  BILITOT 0.7 0.7  PROT 6.8 5.9*  ALBUMIN 4.0 3.3*   No results for input(s): "LIPASE", "AMYLASE" in the last 168 hours. No results for input(s): "AMMONIA" in the last 168 hours. Coagulation Profile: No results for input(s): "INR", "PROTIME"  in the last 168 hours. Cardiac Enzymes: No results for input(s): "CKTOTAL", "CKMB", "CKMBINDEX", "TROPONINI" in the last 168 hours. BNP (last 3 results) No results for input(s): "PROBNP" in the last 8760 hours. HbA1C: No results for input(s): "HGBA1C" in the last 72 hours. CBG: Recent Labs  Lab 06/29/22 2238  GLUCAP 105*   Lipid Profile: No results for input(s): "CHOL", "HDL", "LDLCALC", "TRIG", "CHOLHDL", "LDLDIRECT" in the last 72 hours. Thyroid Function Tests: Recent Labs    07/04/22 0220  TSH 0.292*   Anemia Panel: No results for input(s): "VITAMINB12", "FOLATE", "FERRITIN", "TIBC", "IRON", "RETICCTPCT" in the last 72 hours. Sepsis Labs: Recent Labs  Lab 06/29/22 2130  LATICACIDVEN 1.7    Recent Results (from the past 240 hour(s))  Resp panel by RT-PCR (RSV, Flu A&B, Covid) Anterior Nasal Swab     Status: Abnormal   Collection Time: 06/29/22  9:18 PM   Specimen: Anterior Nasal Swab  Result Value Ref Range Status   SARS Coronavirus 2 by RT PCR POSITIVE (A) NEGATIVE Final    Comment: (NOTE) SARS-CoV-2 target nucleic acids are DETECTED.  The SARS-CoV-2 RNA is generally detectable in upper respiratory specimens during the acute phase of infection. Positive results are indicative of the presence of the identified virus, but do not rule out bacterial infection or co-infection with other pathogens not detected by the test. Clinical correlation with patient history and other diagnostic information is necessary to determine patient infection status. The expected result is Negative.  Fact Sheet for Patients: EntrepreneurPulse.com.au  Fact Sheet for Healthcare Providers: IncredibleEmployment.be  This test is not yet approved or cleared by the [place] FDA and  has been authorized for detection and/or diagnosis of SARS-CoV-2 by FDA under an Emergency Use Authorization (EUA).  This EUA will remain in effect (meaning this test can  be used) for the duration of  the COVID-19 declaration under Section 564(b)(1) of the A ct, 21 U.S.C. section 360bbb-3(b)(1), unless the authorization is terminated or revoked sooner.     Influenza A by PCR NEGATIVE NEGATIVE Final   Influenza B by PCR NEGATIVE NEGATIVE Final    Comment: (NOTE) The Xpert Xpress SARS-CoV-2/FLU/RSV plus assay is intended as an aid in the diagnosis of influenza from Nasopharyngeal swab specimens and should not be used as a sole basis for treatment. Nasal washings and aspirates are unacceptable for Xpert Xpress SARS-CoV-2/FLU/RSV testing.  Fact Sheet for Patients: EntrepreneurPulse.com.au  Fact Sheet for Healthcare Providers: IncredibleEmployment.be  This test is not yet approved or cleared by the [place] FDA and has been authorized for detection and/or diagnosis of SARS-CoV-2 by FDA under an Emergency Use Authorization (EUA). This EUA will remain in effect (meaning this test can be used) for the duration of the COVID-19 declaration under Section 564(b)(1) of the Act, 21 U.S.C. section 360bbb-3(b)(1), unless the authorization is terminated or revoked.     Resp Syncytial Virus by  PCR NEGATIVE NEGATIVE Final    Comment: (NOTE) Fact Sheet for Patients: EntrepreneurPulse.com.au  Fact Sheet for Healthcare Providers: IncredibleEmployment.be  This test is not yet approved or cleared by the [place] FDA and has been authorized for detection and/or diagnosis of SARS-CoV-2 by FDA under an Emergency Use Authorization (EUA). This EUA will remain in effect (meaning this test can be used) for the duration of the COVID-19 declaration under Section 564(b)(1) of the Act, 21 U.S.C. section 360bbb-3(b)(1), unless the authorization is terminated or revoked.  Performed at [facility], [place] [address] [place], [place] [address]          Radiology  Studies: CT Angio Chest PE W and/or Wo Contrast  Result Date: 07/03/2022 CLINICAL DATA:  Pulmonary embolism (PE) suspected, high prob. COVID positive, tachycardia EXAM: CT ANGIOGRAPHY CHEST WITH CONTRAST TECHNIQUE: Multidetector CT imaging of the chest was performed using the standard protocol during bolus administration of intravenous contrast. Multiplanar CT image reconstructions and MIPs were obtained to evaluate the vascular anatomy. RADIATION DOSE REDUCTION: This exam was performed according to the departmental dose-optimization program which includes automated exposure control, adjustment of the mA and/or kV according to patient size and/or use of iterative reconstruction technique. CONTRAST:  104m OMNIPAQUE IOHEXOL 350 MG/ML SOLN COMPARISON:  None Available. FINDINGS: Cardiovascular: Very small filling defect in a subsegmental branch of a right upper lobe pulmonary artery compatible with small pulmonary embolus. No additional filling defects. Heart is mildly enlarged. Aorta normal caliber, tortuous with scattered calcifications. Mediastinum/Nodes: No mediastinal, hilar, or axillary adenopathy. Trachea and esophagus are unremarkable. Large hiatal hernia. Thyroid unremarkable. Lungs/Pleura: Compressive atelectasis in the left lower lobe adjacent to the large hiatal hernia. Trace right pleural effusion with minimal right base atelectasis. Upper Abdomen: Reflux of contrast into the IVC and hepatic veins suggesting right heart dysfunction. Musculoskeletal: Chest wall soft tissues are unremarkable. No acute bony abnormality. Old right rib fractures. Review of the MIP images confirms the above findings. IMPRESSION: Very small filling defect in a right upper lobe pulmonary arterial subsegmental branch compatible with small pulmonary embolus. This is of questionable clinical significance due to its very small size. Cardiomegaly. Large hiatal hernia. Trace right pleural effusion. Aortic Atherosclerosis (ICD10-I70.0).  These results were called by telephone at the time of interpretation on 07/03/2022 at 8:10 pm to provider HAYLEY NAASZ , who verbally acknowledged these results. Electronically Signed   By: KRolm BaptiseM.D.   On: 07/03/2022 20:12   DG Chest Portable 1 View  Result Date: 07/03/2022 CLINICAL DATA:  Shortness of breath. EXAM: PORTABLE CHEST 1 VIEW COMPARISON:  06/29/2022 FINDINGS: Lungs are adequately inflated with stable opacification in the left retrocardiac region compatible with known large hiatal hernia. Lungs are otherwise clear. Cardiomediastinal silhouette and remainder of the exam is unchanged. IMPRESSION: 1. No acute findings. 2. Large hiatal hernia. Electronically Signed   By: DMarin OlpM.D.   On: 07/03/2022 15:01        Scheduled Meds:  acetaminophen  1,000 mg Oral BID   apixaban  10 mg Oral BID   feeding supplement  1 Container Oral BID BM   losartan  50 mg Oral BID   metoprolol tartrate  25 mg Oral BID   pantoprazole  40 mg Oral BID AC   Continuous Infusions:   LOS: 0 days   Time spent= 35 mins    Ladd Cen CArsenio Loader MD [facility]  If 7PM-7AM, please contact night-coverage  07/04/2022, 8:36 AM

## 2022-07-04 NOTE — Evaluation (Signed)
Physical Therapy Evaluation Patient Details Name: Michele [name] MRN: [id number] DOB: 1927-05-04 Today's Date: 07/04/2022  History of Present Illness  89 y.o. female admitted 2/8 with Aflutter and SOB. Pt just admitted 2/4-2/6 at [facility] with weakness and AMS found to be Covid + with transition from [place] to SNF. PMHx: asthma, HTN, melanoma, GERD, osteoporosis, OA  Clinical Impression  PT with flat affect, decreased responsiveness and initiation. Daughter present to assist with hearing aids and confirm pt was in ILF prior to Covid and caring for herself. Pt with increased time for all transitions and commands but able to walk in hall with RW and assist. Pt with decreased cognition, strength, transfers and gait who will benefit from acute therapy to maximize mobility, safety and independence to decrease burden of care.  HR 104 SPO2 97% on RA       Recommendations for follow up therapy are one component of a multi-disciplinary discharge planning process, led by the attending physician.  Recommendations may be updated based on patient status, additional functional criteria and insurance authorization.  Follow Up Recommendations Skilled nursing-short term rehab (<3 hours/day) Can patient physically be transported by private vehicle: Yes    Assistance Recommended at Discharge Frequent or constant Supervision/Assistance  Patient can return home with the following  A little help with bathing/dressing/bathroom;Assistance with cooking/housework;Assist for transportation;Help with stairs or ramp for entrance;A lot of help with walking and/or transfers;Direct supervision/assist for medications management;Direct supervision/assist for financial management    Equipment Recommendations None recommended by PT  Recommendations for Other Services       Functional Status Assessment Patient has had a recent decline in their functional status and/or demonstrates limited ability to make significant  improvements in function in a reasonable and predictable amount of time     Precautions / Restrictions Precautions Precautions: Fall      Mobility  Bed Mobility Overal bed mobility: Needs Assistance Bed Mobility: Supine to Sit     Supine to sit: Max assist     General bed mobility comments: max assist to initiate movement toward EOB and elevate trunk then mod assist to scoot fully to EOB    Transfers Overall transfer level: Needs assistance   Transfers: Sit to/from Stand Sit to Stand: Min assist           General transfer comment: increased time to initiate rise with tactile cues for power up and hand placement    Ambulation/Gait Ambulation/Gait assistance: Min assist Gait Distance (Feet): 140 Feet Assistive device: Rolling walker (2 wheels) Gait Pattern/deviations: Step-through pattern, Decreased stride length, Trunk flexed   Gait velocity interpretation: <1.8 ft/sec, indicate of risk for recurrent falls   General Gait Details: assist to direct and turn RW, pt able to self-regulate distance  Stairs            Wheelchair Mobility    Modified Rankin (Stroke Patients Only)       Balance Overall balance assessment: Needs assistance Sitting-balance support: Bilateral upper extremity supported, Feet supported Sitting balance-Leahy Scale: Fair     Standing balance support: Bilateral upper extremity supported, Reliant on assistive device for balance Standing balance-Leahy Scale: Poor Standing balance comment: RW in standing                             Pertinent Vitals/Pain Pain Assessment Pain Assessment: No/denies pain    Home Living Family/patient expects to be discharged to:: Skilled nursing facility  Home Equipment: Conservation officer, nature (2 wheels);Cane - single point      Prior Function               Mobility Comments: pt was using a RW for the last 3 weeks since fall from kneeling at home ADLs Comments: pt  has in ILF and dressing/bathing until 2/4     Hand Dominance        Extremity/Trunk Assessment   Upper Extremity Assessment Upper Extremity Assessment: Generalized weakness    Lower Extremity Assessment Lower Extremity Assessment: Generalized weakness    Cervical / Trunk Assessment Cervical / Trunk Assessment: Kyphotic  Communication   Communication: HOH (bil hearing aids)  Cognition Arousal/Alertness: Awake/alert Behavior During Therapy: Flat affect Overall Cognitive Status: Impaired/Different from baseline Area of Impairment: Orientation, Attention, Memory, Following commands, Safety/judgement                 Orientation Level: Disoriented to, Time, Place, Situation Current Attention Level: Sustained Memory: Decreased short-term memory Following Commands: Follows one step commands inconsistently, Follows one step commands with increased time Safety/Judgement: Decreased awareness of deficits, Decreased awareness of safety Awareness: Intellectual   General Comments: pt with flat affect, HOH and slow processing. Pt not initiating movement with cues alone and requires physical assist to initiate movement then pt will assist with transfers and gait. daughter present to confirm pt normally oriented and able to follow conversation        General Comments      Exercises     Assessment/Plan    PT Assessment Patient needs continued PT services  PT Problem List Decreased strength;Decreased activity tolerance;Decreased mobility;Decreased cognition;Decreased safety awareness;Decreased balance;Decreased knowledge of use of DME;Decreased knowledge of precautions       PT Treatment Interventions DME instruction;Therapeutic activities;Balance training;Cognitive remediation;Gait training;Functional mobility training;Therapeutic exercise;Patient/family education    PT Goals (Current goals can be found in the Care Plan section)  Acute Rehab PT Goals Patient Stated Goal:  return home PT Goal Formulation: With patient/family Time For Goal Achievement: 07/18/22 Potential to Achieve Goals: Fair    Frequency Min 3X/week     Co-evaluation               AM-PAC PT "6 Clicks" Mobility  Outcome Measure Help needed turning from your back to your side while in a flat bed without using bedrails?: A Little Help needed moving from lying on your back to sitting on the side of a flat bed without using bedrails?: A Lot Help needed moving to and from a bed to a chair (including a wheelchair)?: A Lot Help needed standing up from a chair using your arms (e.g., wheelchair or bedside chair)?: A Little Help needed to walk in hospital room?: A Little Help needed climbing 3-5 steps with a railing? : Total 6 Click Score: 14    End of Session Equipment Utilized During Treatment: Gait belt Activity Tolerance: Patient tolerated treatment well Patient left: in chair;with chair alarm set;with family/visitor present;with call bell/phone within reach Nurse Communication: Mobility status PT Visit Diagnosis: Unsteadiness on feet (R26.81);Difficulty in walking, not elsewhere classified (R26.2)    Time: 1114-1140 PT Time Calculation (min) (ACUTE ONLY): 26 min   Charges:   PT Evaluation $PT Eval Moderate Complexity: 1 Mod PT Treatments $Gait Training: 8-22 mins        Bayard Males, PT Acute Rehabilitation Services Office: [phone]   Lamarr Lulas 07/04/2022, 12:31 PM

## 2022-07-04 NOTE — NC FL2 (Signed)
[place] LEVEL OF CARE FORM     IDENTIFICATION  Patient Name: Michele [name] Birthdate: [birth year] Sex: female Admission Date (Current Location): 07/03/2022  [facility] and [place] Number:  Herbalist and Address:  [facility], [place] [address], [place], [place] [address]      Provider Number: [id number]  Attending Physician Name and Address:  Damita Lack, MD  Relative Name and Phone Number:       Current Level of Care: Hospital Recommended Level of Care: [place] Prior Approval Number:    Date Approved/Denied:   PASRR Number: [id number]  Discharge Plan: SNF    Current Diagnoses: Patient Active Problem List   Diagnosis Date Noted   Atrial flutter ([place]) 07/03/2022   History of COVID-19 07/03/2022   Acute pulmonary embolism ([place]) 07/03/2022   COVID-19 virus infection 06/30/2022   Acute hypoxemic respiratory failure ([place]) 06/30/2022   Confusion 06/30/2022   Hiatal hernia 06/30/2022   Nausea and vomiting 08/13/2019   Elevated lipase 08/13/2019   Dilated cbd, acquired 08/13/2019   Gallbladder hydrops 08/13/2019   Hyponatremia 08/13/2019   Aortic atherosclerosis ([place]) 08/13/2019   Nausea & vomiting 08/13/2019   Constipation 04/28/2016   GERD (gastroesophageal reflux disease) 04/28/2016   Multiple closed fractures of ribs of right side    Hemothorax on right    Leukocytosis    Ribs, multiple fractures 04/08/2016   Cystocele 08/18/2012   Sepsis secondary to UTI ([place]) 04/27/2012   E coli bacteremia 04/27/2012   Elevated troponin 04/27/2012   Essential hypertension 04/27/2012   Asthma 04/27/2012   Nausea 04/27/2012    Orientation RESPIRATION BLADDER Height & Weight     Self, Situation  Normal Continent, External catheter (External Urinary Catheter) Weight: 101 lb 6.6 oz (46 kg) Height:  5' (152.4 cm)  BEHAVIORAL SYMPTOMS/MOOD NEUROLOGICAL BOWEL NUTRITION STATUS      Continent (W[place]) Diet  (Please see discharge summary)  AMBULATORY STATUS COMMUNICATION OF NEEDS Skin   Limited Assist Verbally Other (Comment) (Appropriate for ethnicity,dry,Erythema,heel,Bilateral,Wound/Incision LDAs)                       Personal Care Assistance Level of Assistance  Bathing, Feeding, Dressing Bathing Assistance: Limited assistance Feeding assistance: Independent Dressing Assistance: Limited assistance     Functional Limitations Info  Sight, Hearing, Speech   Hearing Info: Impaired Speech Info: Adequate    SPECIAL CARE FACTORS FREQUENCY  PT (By licensed PT), OT (By licensed OT)     PT Frequency: 5x min weekly OT Frequency: 5x min weekly            Contractures Contractures Info: Not present    Additional Factors Info  Code Status, Allergies, Isolation Precautions Code Status Info: DNR Allergies Info: Penicillins,Pneumococcal Vaccines     Isolation Precautions Info: Covid +     Current Medications (07/04/2022):  This is the current hospital active medication list Current Facility-Administered Medications  Medication Dose Route Frequency Provider Last Rate Last Admin   acetaminophen (TYLENOL) tablet 1,000 mg  1,000 mg Oral BID Tu, Ching T, DO   1,000 mg at 07/04/22 1023   acetaminophen (TYLENOL) tablet 650 mg  650 mg Oral Q6H PRN Amin, Ankit Chirag, MD       apixaban (ELIQUIS) tablet 10 mg  10 mg Oral BID Amin, Ankit Chirag, MD       Followed by   Derrill Memo ON 07/10/2022] apixaban (ELIQUIS) tablet 5 mg  5 mg Oral BID Amin, Ankit Chirag, MD       feeding supplement (BOOST HIGH PROTEIN) liquid 237 mL  1 Container Oral BID BM Tu, Ching T, DO   237 mL at 07/04/22 1615   guaiFENesin (ROBITUSSIN) 100 MG/5ML liquid 5 mL  5 mL Oral Q4H PRN Amin, Ankit Chirag, MD       hydrALAZINE (APRESOLINE) injection 10 mg  10 mg Intravenous Q4H PRN Amin, Ankit Chirag, MD       ipratropium-albuterol (DUONEB) 0.5-2.5 (3) MG/3ML nebulizer solution 3 mL  3 mL Nebulization Q4H PRN Amin, Ankit  Chirag, MD       losartan (COZAAR) tablet 50 mg  50 mg Oral BID Tu, Ching T, DO   50 mg at 07/04/22 1024   metoprolol tartrate (LOPRESSOR) injection 5 mg  5 mg Intravenous Q4H PRN Amin, Ankit Chirag, MD       metoprolol tartrate (LOPRESSOR) tablet 25 mg  25 mg Oral BID Tu, Ching T, DO   25 mg at 07/04/22 1024   montelukast (SINGULAIR) tablet 10 mg  10 mg Oral Daily Amin, Ankit Chirag, MD   10 mg at 07/04/22 1612   ondansetron (ZOFRAN) injection 4 mg  4 mg Intravenous Q6H PRN Amin, Ankit Chirag, MD       pantoprazole (PROTONIX) EC tablet 40 mg  40 mg Oral BID AC Tu, Ching T, DO   40 mg at 07/04/22 1024   senna-docusate (Senokot-S) tablet 1 tablet  1 tablet Oral QHS PRN Amin, Ankit Chirag, MD       traZODone (DESYREL) tablet 50 mg  50 mg Oral QHS PRN Amin, Jeanella Flattery, MD         Discharge Medications: Please see discharge summary for a list of discharge medications.  Relevant Imaging Results:  Relevant Lab Results:   Additional Information [phone]  Milas Gain, LCSWA

## 2022-07-04 NOTE — Progress Notes (Signed)
Echo attempted @ 13:00. Patient was in the chair, the nurse tech and I tried several times to explain we needed to get her back in bed for the echo. The patient refused stating she just wants to go home and thinks she is being held here too long to give the hospital business. Nurse notified and will secure chat when patient is back in bed so we can attempt the echo again.

## 2022-07-04 NOTE — Progress Notes (Cosign Needed)
Rounding Note    Patient Name: Michele [name] Date of Encounter: 07/04/2022  [place] Cardiologist: None   Subjective   Patient reports no chest pain, shortness of breath, palpitations today. She is quite hard of hearing and obtaining HPI difficult.  Inpatient Medications    Scheduled Meds:  acetaminophen  1,000 mg Oral BID   apixaban  10 mg Oral BID   feeding supplement  1 Container Oral BID BM   losartan  50 mg Oral BID   metoprolol tartrate  25 mg Oral BID   pantoprazole  40 mg Oral BID AC   Continuous Infusions:  PRN Meds: metoprolol tartrate   Vital Signs    Vitals:   07/04/22 0020 07/04/22 0148 07/04/22 0245 07/04/22 0310  BP: (!) 162/131 (!) 149/114 (!) 157/123 (!) 142/98  Pulse: (!) 43 89  70  Resp:   18 20  Temp:   97.7 F (36.5 C) 97.7 F (36.5 C)  TempSrc:   Axillary Axillary  SpO2: 96% 95%  96%  Weight:    46 kg  Height:       No intake or output data in the 24 hours ending 07/04/22 0713    07/04/2022    3:10 AM 07/03/2022    2:26 PM 07/03/2022   11:07 AM  Last 3 Weights  Weight (lbs) 101 lb 6.6 oz 98 lb 1.7 oz 98 lb 3.2 oz  Weight (kg) 46 kg 44.5 kg 44.543 kg      Telemetry    Atrial flutter variable conduction - Personally Reviewed  ECG     Atrial flutter with variable conduction- Personally Reviewed  Physical Exam   GEN: No acute distress.   Neck: No JVD Cardiac: irregularly irregular, no murmurs, rubs, or gallops.  Respiratory: Clear to auscultation bilaterally. GI: Soft, nontender, non-distended  MS: No edema; No deformity. Neuro:  Nonfocal  Psych: Normal affect   Labs    High Sensitivity Troponin:   Recent Labs  Lab 06/29/22 0018 06/29/22 2130  TROPONINIHS 25* 29*     Chemistry Recent Labs  Lab 06/29/22 2130 06/30/22 0848 07/03/22 1610 07/04/22 0220  NA 131* 133* 134* 130*  K 3.4* 3.6 3.5 3.6  CL 92* 95* 94* 91*  CO2 27 28 25 27  $ GLUCOSE 134* 97 123* 198*  BUN 16 15 27* 20  CREATININE 0.65  0.83 0.96 0.81  CALCIUM 9.0 8.8* 8.6* 8.5*  MG  --  2.1 1.9  --   PROT 6.8  --  5.9*  --   ALBUMIN 4.0  --  3.3*  --   AST 23  --  24  --   ALT 13  --  13  --   ALKPHOS 45  --  39  --   BILITOT 0.7  --  0.7  --   GFRNONAA >60 >60 55* >60  ANIONGAP 12 10 15 12    $ Lipids No results for input(s): "CHOL", "TRIG", "HDL", "LABVLDL", "LDLCALC", "CHOLHDL" in the last 168 hours.  Hematology Recent Labs  Lab 06/29/22 2130 07/03/22 1518 07/04/22 0220  WBC 7.4 10.7* 7.8  RBC 4.15 4.43 4.24  HGB 13.8 15.1* 14.1  HCT 41.1 42.6 40.6  MCV 99.0 96.2 95.8  MCH 33.3 34.1* 33.3  MCHC 33.6 35.4 34.7  RDW 12.7 13.2 12.5  PLT 203 299 271   Thyroid  Recent Labs  Lab 07/04/22 0220  TSH 0.292*    BNPNo results for input(s): "BNP", "PROBNP" in the last  168 hours.  DDimer No results for input(s): "DDIMER" in the last 168 hours.   Radiology    CT Angio Chest PE W and/or Wo Contrast  Result Date: 07/03/2022 CLINICAL DATA:  Pulmonary embolism (PE) suspected, high prob. COVID positive, tachycardia EXAM: CT ANGIOGRAPHY CHEST WITH CONTRAST TECHNIQUE: Multidetector CT imaging of the chest was performed using the standard protocol during bolus administration of intravenous contrast. Multiplanar CT image reconstructions and MIPs were obtained to evaluate the vascular anatomy. RADIATION DOSE REDUCTION: This exam was performed according to the departmental dose-optimization program which includes automated exposure control, adjustment of the mA and/or kV according to patient size and/or use of iterative reconstruction technique. CONTRAST:  35m OMNIPAQUE IOHEXOL 350 MG/ML SOLN COMPARISON:  None Available. FINDINGS: Cardiovascular: Very small filling defect in a subsegmental branch of a right upper lobe pulmonary artery compatible with small pulmonary embolus. No additional filling defects. Heart is mildly enlarged. Aorta normal caliber, tortuous with scattered calcifications. Mediastinum/Nodes: No mediastinal,  hilar, or axillary adenopathy. Trachea and esophagus are unremarkable. Large hiatal hernia. Thyroid unremarkable. Lungs/Pleura: Compressive atelectasis in the left lower lobe adjacent to the large hiatal hernia. Trace right pleural effusion with minimal right base atelectasis. Upper Abdomen: Reflux of contrast into the IVC and hepatic veins suggesting right heart dysfunction. Musculoskeletal: Chest wall soft tissues are unremarkable. No acute bony abnormality. Old right rib fractures. Review of the MIP images confirms the above findings. IMPRESSION: Very small filling defect in a right upper lobe pulmonary arterial subsegmental branch compatible with small pulmonary embolus. This is of questionable clinical significance due to its very small size. Cardiomegaly. Large hiatal hernia. Trace right pleural effusion. Aortic Atherosclerosis (ICD10-I70.0). These results were called by telephone at the time of interpretation on 07/03/2022 at 8:10 pm to provider HAYLEY NAASZ , who verbally acknowledged these results. Electronically Signed   By: KRolm BaptiseM.D.   On: 07/03/2022 20:12   DG Chest Portable 1 View  Result Date: 07/03/2022 CLINICAL DATA:  Shortness of breath. EXAM: PORTABLE CHEST 1 VIEW COMPARISON:  06/29/2022 FINDINGS: Lungs are adequately inflated with stable opacification in the left retrocardiac region compatible with known large hiatal hernia. Lungs are otherwise clear. Cardiomediastinal silhouette and remainder of the exam is unchanged. IMPRESSION: 1. No acute findings. 2. Large hiatal hernia. Electronically Signed   By: DMarin OlpM.D.   On: 07/03/2022 15:01    Cardiac Studies   04/27/12 TTE  Study Conclusions   - Left ventricle: The cavity size was normal. Wall thickness    was normal. Systolic function was normal. The estimated    ejection fraction was in the range of 55% to 60%. Wall    motion was normal; there were no regional wall motion    abnormalities. Doppler parameters are  consistent with    abnormal left ventricular relaxation (grade 1 diastolic    dysfunction).  - Mitral valve: Calcified annulus.  Transthoracic echocardiography.  M-mode, complete 2D,  spectral Doppler, and color Doppler.  Height:  Height:  152.4cm. Height: 60in.  Weight:  Weight: 50.5kg. Weight:  111lb.  Body mass index:  BMI: 21.7kg/m^2.  Body surface  area:    BSA: 1.458m.  Patient status:  Inpatient.  Location:  Bedside.   ------------------------------------------------------------   ------------------------------------------------------------  Left ventricle:  The cavity size was normal. Wall thickness  was normal. Systolic function was normal. The estimated  ejection fraction was in the range of 55% to 60%. Wall  motion was normal; there were no regional wall  motion  abnormalities. Doppler parameters are consistent with  abnormal left ventricular relaxation (grade 1 diastolic  dysfunction).   ------------------------------------------------------------  Aortic valve:   Structurally normal valve. Trileaflet. Cusp  separation was normal.  Doppler:  Transvalvular velocity was  within the normal range. There was no stenosis.  No  regurgitation.   ------------------------------------------------------------  Aorta: The aorta was normal, not dilated, and non-diseased.    ------------------------------------------------------------  Mitral valve:   Calcified annulus. Leaflet separation was  normal.  Doppler:  Transvalvular velocity was within the  normal range. There was no evidence for stenosis.  No  regurgitation.   ------------------------------------------------------------  Left atrium:  The atrium was normal in size.   ------------------------------------------------------------  Right ventricle:  The cavity size was normal. Wall thickness  was normal. Systolic function was normal.   ------------------------------------------------------------  Tricuspid valve:    Structurally normal valve.   Leaflet  separation was normal.  Doppler:  Transvalvular velocity was  within the normal range.  No regurgitation.   ------------------------------------------------------------  Pulmonary artery:   Systolic pressure was within the normal  range.   ------------------------------------------------------------  Right atrium:  The atrium was normal in size. The Eustachian  valve appeared prominant.   ------------------------------------------------------------  Post procedure conclusions  Ascending Aorta:   - The aorta was normal, not dilated, and non-diseased.   Patient Profile     PORFIRIA ALMER is a 89 y.o. female with medical history significant of HTN, osteoporosis, mild intermittent asthma, GERD, chronic hyponatremia. Cardiology consulted for rapid HR.    Assessment & Plan    Atrial flutter with RVR  Patient recently diagnosed with COVID-19 presented to the ED from her SNF with palpitations and rapid HR. Most recent ECG with 3:1 atrial flutter.   Suspect atrial flutter is 2/2 COVID-19 with concurrent PE. Rates are now controlled following overnight Lopressor push. Continue rate control with oral Metoprolol today. Could also utilize Coreg given hypertension. Continue Eliquis (currently PE dose). Low dose in 7 days (age >31, weight <60kg). If patient were to have persistent atrial flutter following resolution of COVID-19 and management of PE, could consider TEE/DCCV although she is currently asymptomatic despite arrhythmia.  Pulmonary embolism  Patient found with "small filling defect in subsegmental branch of right upper lobe pulmonary artery compatible with small PE. Heart is mildly enlarged."   Given cardiac enlargement on CTA, would recommend TTE today. Continue PE dose Eliquis  Hypertension  Patient on home Losartan 57m BID. She appears to be quite hypertensive, would consider transition to valsartan for tighter BP control.   Per  primary team:  COVID-19      For questions or updates, please contact CIngallsPlease consult [email or web address] for contact info under        Signed, ELily Kocher PA-C  07/04/2022, 7:13 AM    Patient seen and examined with EW PA-C.  Agree as above, with the following exceptions and changes as noted below. In rate controlled atrial flutter variable conduction currently. Gen: NAD, CV: iRRR, no murmurs, Lungs: clear, Abd: soft, Extrem: Warm, well perfused, no edema, Neuro/Psych: alert and oriented x 3, normal mood and affect. All available labs, radiology testing, previous records reviewed. Agree with eliquis, will need PE dosing.   GElouise Munroe MD

## 2022-07-04 NOTE — TOC Initial Note (Addendum)
Transition of Care [facility]) - Initial/Assessment Note    Patient Details  Name: Michele [name] MRN: [id number] Date of Birth: Oct 19, 1926  [facility] CM/SW Contact:    Milas Gain, [place] Phone Number: 07/04/2022, 12:45 PM  Clinical Narrative:                  Due to patients current orientation CSW Lvm with Michele [name] patients daughter to discuss PT recommendations. CSW unable to reach susan CSW contacted patients daughter Michele [name] who confirmed plan is for patient to return back to short term rehab at [facility].  Patients daughter reports patient  is Independent Living at [facility] but PTA she was receiving short term rehab at [facility]. CSW spoke with Michele [name] at [place] who confirmed patient can return to short term rehab when medically ready. No insurance authorization needed. Patient pays privately. Michele [name] informed CSW if patient discharges over the weekend to contact [place] at [phone].. CSW will continue to follow and assist with patients dc planning needs.        Patient Goals and CMS Choice            Expected Discharge Plan and Services                                              Prior Living Arrangements/Services                       Activities of Daily Living      Permission Sought/Granted                  Emotional Assessment              Admission diagnosis:  Atrial flutter ([place]) [I48.92] Atrial flutter with rapid ventricular response ([place]) [I48.92] Single subsegmental pulmonary embolism without acute cor pulmonale (H[facility]) [I26.93] Patient Active Problem List   Diagnosis Date Noted   Atrial flutter ([place]) 07/03/2022   History of COVID-19 07/03/2022   Acute pulmonary embolism ([place]) 07/03/2022   COVID-19 virus infection 06/30/2022   Acute hypoxemic respiratory failure ([place]) 06/30/2022   Confusion 06/30/2022   Hiatal hernia 06/30/2022   Nausea and vomiting 08/13/2019   Elevated lipase 08/13/2019   Dilated  cbd, acquired 08/13/2019   Gallbladder hydrops 08/13/2019   Hyponatremia 08/13/2019   Aortic atherosclerosis ([place]) 08/13/2019   Nausea & vomiting 08/13/2019   Constipation 04/28/2016   GERD (gastroesophageal reflux disease) 04/28/2016   Multiple closed fractures of ribs of right side    Hemothorax on right    Leukocytosis    Ribs, multiple fractures 04/08/2016   Cystocele 08/18/2012   Sepsis secondary to UTI ([place]) 04/27/2012   E coli bacteremia 04/27/2012   Elevated troponin 04/27/2012   Essential hypertension 04/27/2012   Asthma 04/27/2012   Nausea 04/27/2012   PCP:  Burnard Bunting, MD Pharmacy:   [id number] [id number] [id number]: [phone]: [phone] [id number]: [phone]: [phone]    Social Determinants of Health (SDOH) Social History: SDOH Screenings   Food Insecurity: No Food Insecurity (07/01/2022)  Housing: Low Risk  (07/01/2022)  Transportation Needs: No Transportation Needs (  07/01/2022)  Utilities: Not At Risk (07/01/2022)  Tobacco Use: Low Risk  (07/03/2022)   SDOH Interventions:     Readmission Risk Interventions     No data to display

## 2022-07-04 NOTE — Progress Notes (Signed)
Contacted daughter Tymira Villalona to make sure family was aware of patient's admission to this unit. Ms Royden Purl advised she was aware and that sister Narda Rutherford was here with patient in the ER. Ms Royden Purl asked that Narda Rutherford be added to records as additional contact person.

## 2022-07-04 NOTE — Assessment & Plan Note (Signed)
-  continue losartan

## 2022-07-04 NOTE — Discharge Instructions (Addendum)
Information on my medicine - ELIQUIS (apixaban)  Why was Eliquis prescribed for you? Eliquis was prescribed to treat blood clots that may have been found in the veins of your legs (deep vein thrombosis) or in your lungs (pulmonary embolism) and to reduce the risk of them occurring again.  What do You need to know about Eliquis ? The starting dose is 10 mg (two 5 mg tablets) taken TWICE daily for the FIRST SEVEN (7) DAYS, then on 07/10/2022 the dose is reduced to ONE 5 mg tablet taken TWICE daily.  Eliquis may be taken with or without food.   Try to take the dose about the same time in the morning and in the evening. If you have difficulty swallowing the tablet whole please discuss with your pharmacist how to take the medication safely.  Take Eliquis exactly as prescribed and DO NOT stop taking Eliquis without talking to the doctor who prescribed the medication.  Stopping may increase your risk of developing a new blood clot.  Refill your prescription before you run out.  After discharge, you should have regular check-up appointments with your healthcare provider that is prescribing your Eliquis.    What do you do if you miss a dose? If a dose of ELIQUIS is not taken at the scheduled time, take it as soon as possible on the same day and twice-daily administration should be resumed. The dose should not be doubled to make up for a missed dose.  Important Safety Information A possible side effect of Eliquis is bleeding. You should call your healthcare provider right away if you experience any of the following: Bleeding from an injury or your nose that does not stop. Unusual colored urine (red or dark brown) or unusual colored stools (red or black). Unusual bruising for unknown reasons. A serious fall or if you hit your head (even if there is no bleeding).  Some medicines may interact with Eliquis and might increase your risk of bleeding or clotting while on Eliquis. To help avoid this,  consult your healthcare provider or pharmacist prior to using any new prescription or non-prescription medications, including herbals, vitamins, non-steroidal anti-inflammatory drugs (NSAIDs) and supplements.  This website has more information on Eliquis (apixaban): http://www.eliquis.com/eliquis/home

## 2022-07-05 ENCOUNTER — Observation Stay (HOSPITAL_COMMUNITY): Payer: Medicare Other

## 2022-07-05 ENCOUNTER — Observation Stay (HOSPITAL_BASED_OUTPATIENT_CLINIC_OR_DEPARTMENT_OTHER): Payer: Medicare Other

## 2022-07-05 DIAGNOSIS — I4891 Unspecified atrial fibrillation: Secondary | ICD-10-CM | POA: Diagnosis not present

## 2022-07-05 DIAGNOSIS — I4892 Unspecified atrial flutter: Secondary | ICD-10-CM | POA: Diagnosis not present

## 2022-07-05 LAB — BASIC METABOLIC PANEL
Anion gap: 11 (ref 5–15)
BUN: 31 mg/dL — ABNORMAL HIGH (ref 8–23)
CO2: 27 mmol/L (ref 22–32)
Calcium: 8.8 mg/dL — ABNORMAL LOW (ref 8.9–10.3)
Chloride: 94 mmol/L — ABNORMAL LOW (ref 98–111)
Creatinine, Ser: 0.93 mg/dL (ref 0.44–1.00)
GFR, Estimated: 57 mL/min — ABNORMAL LOW (ref >60)
Glucose, Bld: 108 mg/dL — ABNORMAL HIGH (ref 70–99)
Potassium: 3.5 mmol/L (ref 3.5–5.1)
Sodium: 132 mmol/L — ABNORMAL LOW (ref 135–145)

## 2022-07-05 LAB — CBC
HCT: 40.5 % (ref 36.0–46.0)
Hemoglobin: 14.2 g/dL (ref 12.0–15.0)
MCH: 33.6 pg (ref 26.0–34.0)
MCHC: 35.1 g/dL (ref 30.0–36.0)
MCV: 95.7 fL (ref 80.0–100.0)
Platelets: 266 10*3/uL (ref 150–400)
RBC: 4.23 MIL/uL (ref 3.87–5.11)
RDW: 12.4 % (ref 11.5–15.5)
WBC: 9.4 10*3/uL (ref 4.0–10.5)
nRBC: 0 % (ref 0.0–0.2)

## 2022-07-05 LAB — ECHOCARDIOGRAM COMPLETE
Height: 60 in
S' Lateral: 1.5 cm
Weight: 1622.59 oz

## 2022-07-05 LAB — MAGNESIUM: Magnesium: 1.9 mg/dL (ref 1.7–2.4)

## 2022-07-05 MED ORDER — APIXABAN 5 MG PO TABS: 10.0000 mg | ORAL_TABLET | Freq: Two times a day (BID) | ORAL | 0 refills | Status: DC

## 2022-07-05 MED ORDER — METOPROLOL TARTRATE 25 MG PO TABS: 25.0000 mg | ORAL_TABLET | Freq: Two times a day (BID) | ORAL | 1 refills | Status: DC

## 2022-07-05 MED ORDER — APIXABAN 5 MG PO TABS: 5.0000 mg | ORAL_TABLET | Freq: Two times a day (BID) | ORAL | 2 refills | Status: DC

## 2022-07-05 NOTE — Discharge Summary (Signed)
Physician Discharge Summary  Michele [name] [id number] DOB: 08-23-1926 DOA: 07/03/2022  PCP: Burnard Bunting, MD  Admit date: 07/03/2022 Discharge date: 07/05/2022  Time spent: 40 minutes  Recommendations for Outpatient Follow-up:  Follow outpatient CBC/CMP  Consider lower extremity US for DVT eval outpatient, deferred here due to developing agitation/concern for delirium Needs treatment with eliquis for at least 3 months (provoking factors thought to be covid, recent rib fx and immobility), after this, can consider decreasing dose to low dose based on her weight and age  Repeat thyroid function tests outpatient Follow with cards outpatient, follow abnormal echo results Isolate per covid guidelines  Discharge Diagnoses:  Principal Problem:   Atrial flutter ([place]) Active Problems:   Essential hypertension   History of COVID-19   Acute pulmonary embolism ([place])   Discharge Condition: stable  Diet recommendation: heart healthy, diabetic  Filed Weights   07/03/22 1426 07/04/22 0310  Weight: 44.5 kg 46 kg    History of present illness:  89 year old with history of HTN, osteoporosis, asthma, GERD, chronic hyponatremia admitted for tachycardia. She was admitted and discharged on 2/6 to SNF following evaluation of confusion and acute hypoxia secondary to COVID-19 infection. Nursing staff at the facility noted she was tachycardic with heart rate almost as high as 170. In the ED she was noted to have Michele [name] flutter. CTA chest showed small segmental defect in the right upper lobe consistent with possible small pulmonary embolism. Cardiology was consulted by EDP   She was diagnosed with atrial flutter and small pulmonary embolism.  Started on metoprolol and eliquis.  Follow outpatient with cardiology and PCP.  Stable at discharge.  Hospital Course:  Assessment and Plan:  Atrial flutter ([place]) -This appears to be new diagnosis.  I suspect due to underlying pulmonary issues and COVID-19  illness -- echo as noted below.   -- low TSH, normal free T4, follow outpatient  -- discharge on metoprolol.  Continue eliquis, can consider low dose after her PE has been adequately treated (after at least 3 months) -- follow with cardiology outpatient     Acute pulmonary embolism ([place]), small right upper lobe CT with "very small filling defect in Michele [name] right upper lobe pulmonary arterial subsegmental branch compatible with small pulmonary emolus" Echo with mildly reduced RVSF, mildly enlarged RV size - preserved LVEF, mild LVH, pseudodyskinesis of inferolateral wall (may suggest intraabdominal pathology) LE [place] ordered, but deferred at d/c due to delirium/agitation - can pursue outpatient Provoked in setting of recent rib fracture (immobility) and covid 19 infection, would treat x3 months with eliquis, then would consider decrease to low dose based on weight and age.  Follow outpatient with PCP/cards for discussion risk/benefits.   History of COVID-19 -Initially tested positive on 2/4.  Overall she is asymptomatic.  Paxlovid discontinued.  Isolate per covid guidelines.     Essential hypertension -continue losartan   GERD History of large hiatal hernia - PPI   Essential hypertension - Losartan, now on beta-blockers   History of asthma - As needed bronchodilators   Delirium -- related to hospitalization, follow outpatient, I think discharging home will be helpful  -- got olazapine last night  Therapy recommending SNF     Procedures: Echo IMPRESSIONS     1. Left ventricular ejection fraction, by estimation, is 65 to 70%. The  left ventricle has normal function. Left ventricular endocardial border  not optimally defined to evaluate regional wall motion. There is mild left  ventricular hypertrophy. Left  ventricular diastolic parameters are  indeterminate. There is  pseudodyskinesis of the inferolateral wall may suggest intraadominal  pathology. Given clinical history, may be  related to large hiatal hernia.   2. Right ventricular systolic function is mildly reduced. The right  ventricular size is mildly enlarged. There is normal pulmonary artery  systolic pressure. The estimated right ventricular systolic pressure is  123XX123 mmHg.   3. Left atrial size was moderately dilated.   4. The mitral valve is grossly normal. No evidence of mitral valve  regurgitation. No evidence of mitral stenosis.   5. Tricuspid valve regurgitation is mild to moderate.   6. The aortic valve is tricuspid. There is mild calcification of the  aortic valve. There is mild thickening of the aortic valve. Aortic valve  regurgitation is not visualized. Aortic valve sclerosis/calcification is  present, without any evidence of  aortic stenosis.   7. The inferior vena cava is normal in size with <50% respiratory  variability, suggesting right atrial pressure of 8 mmHg.    Consultations: cardiology  Discharge Exam: Vitals:   07/05/22 0333 07/05/22 1113  BP: 125/83 (!) 138/94  Pulse: 66 92  Resp: 19   Temp: 98.1 F (36.7 C)   SpO2: 96%    Says she wants to go home Discussed case with daughter  General: No acute distress. Cardiovascular: irregular Lungs: unlabored Abdomen: Soft, nontender, nondistended  Neurological: Alert and oriented 3. Moves all extremities 4 with equal strength. Cranial nerves II through XII grossly intact. Extremities: No clubbing or cyanosis. No edema.   Discharge Instructions   Discharge Instructions     Call MD for:  difficulty breathing, headache or visual disturbances   Complete by: As directed    Call MD for:  extreme fatigue   Complete by: As directed    Call MD for:  hives   Complete by: As directed    Call MD for:  persistant dizziness or light-headedness   Complete by: As directed    Call MD for:  persistant nausea and vomiting   Complete by: As directed    Call MD for:  redness, tenderness, or signs of infection (pain, swelling, redness,  odor or green/yellow discharge around incision site)   Complete by: As directed    Call MD for:  severe uncontrolled pain   Complete by: As directed    Call MD for:  temperature >100.4   Complete by: As directed    Diet - low sodium heart healthy   Complete by: As directed    Discharge instructions   Complete by: As directed    You were seen for atrial flutter (abnormal heart rhythm) with Michele [name] rapid rate.  You had Shamyah Stantz CT scan and were found to have Kirk Sampley pulmonary embolism.  We planned on getting lower extremity ultrasounds to evaluate for blood clots, but this would have delayed discharge and we were starting to have issues with delirium and agitation.  I think it's unlikely that the lower extremity ultrasounds will significantly change your care, so we will defer this to the outpatient setting.  Your PCP can order this test if needed.   You ultrasound of your heart (echo) showed normal squeeze and some mild left ventricular enlargement as well as mildly reduced right ventricular systolic function.  Follow these results with the PCP outpatient.  Take eliquis 10 mg twice Michele [name] day for another 5 days, then transition to 5 mg twice Michele [name] day.  After 3 months you can have Michele [name] conversation with your PCP regarding the dose  of eliquis going forward.  For your blood clot, this was likely provoked by your covid 19 infection or your decreased mobility in the setting of your rib fracture.  You'll need 3 months of anticoagulation.  After this your dose of eliquis can probably be reduced based on your age and weight to Michele [name] dose to reduce risk of stroke in atrial flutter.  Before your dose is changed, please review your risk factors with your PCP.   You need repeat thyroid labs outpatient.  Return for new, recurrent, or worsening symptoms.  Please ask your PCP to request records from this hospitalization so they know what was done and what the next steps will be.   Increase activity slowly   Complete by: As directed        Allergies as of 07/05/2022       Reactions   Penicillins Other (See Comments)   Reaction unknown Has patient had Benecio Kluger PCN reaction causing immediate rash, facial/tongue/throat swelling, SOB or lightheadedness with hypotension: n/Aljean Horiuchi Has patient had Alek Poncedeleon PCN reaction causing severe rash involving mucus membranes or skin necrosis: n/Michele [name] Has patient had Miko Markwood PCN reaction that required hospitalization: n/Geetika Laborde Has patient had Jency Schnieders PCN reaction occurring within the last 10 years: n/Mounir Skipper If all of the above answers are "NO", then may proceed with Cephalosporin use.   Pneumococcal Vaccines Other (See Comments)   Reaction unknown        Medication List     STOP taking these medications    nirmatrelvir/ritonavir 20 x 150 MG & 10 x 100MG Tabs Commonly known as: PAXLOVID   predniSONE 20 MG tablet Commonly known as: DELTASONE       TAKE these medications    acetaminophen 500 MG tablet Commonly known as: TYLENOL Take 1,000 mg by mouth in the morning and at bedtime.   albuterol 108 (90 Base) MCG/ACT inhaler Commonly known as: VENTOLIN HFA Inhale into the lungs every 6 (six) hours as needed for wheezing or shortness of breath.   apixaban 5 MG Tabs tablet Commonly known as: ELIQUIS Take 2 tablets (10 mg total) by mouth 2 (two) times daily for 5 days.   apixaban 5 MG Tabs tablet Commonly known as: ELIQUIS Take 1 tablet (5 mg total) by mouth 2 (two) times daily. Start this after you complete the 10 mg doses Start taking on: July 10, 2022   feeding supplement Liqd Take 1 Container by mouth 2 (two) times daily between meals.   guaiFENesin 100 MG/5ML liquid Commonly known as: ROBITUSSIN Take 5 mLs by mouth every 4 (four) hours as needed for cough or to loosen phlegm.   losartan 50 MG tablet Commonly known as: COZAAR Take 50 mg by mouth 2 (two) times daily.   metoprolol tartrate 25 MG tablet Commonly known as: LOPRESSOR Take 1 tablet (25 mg total) by mouth 2 (two) times daily.   montelukast  10 MG tablet Commonly known as: SINGULAIR Take 10 mg by mouth daily.   multivitamin with minerals Tabs tablet Take 1 tablet by mouth daily.   ondansetron 4 MG tablet Commonly known as: ZOFRAN Take 4 mg by mouth every 8 (eight) hours as needed for nausea or vomiting.   pantoprazole 40 MG tablet Commonly known as: PROTONIX Take 40 mg by mouth 2 (two) times daily before Kanita Delage meal.       Allergies  Allergen Reactions   Penicillins Other (See Comments)    Reaction unknown Has patient had Michele [name] PCN reaction causing immediate rash, facial/tongue/throat swelling,  SOB or lightheadedness with hypotension: n/Michele [name] Has patient had Michele [name] PCN reaction causing severe rash involving mucus membranes or skin necrosis: n/Michele [name] Has patient had Michele [name] PCN reaction that required hospitalization: n/Marva Hendryx Has patient had Michele [name] Suto PCN reaction occurring within the last 10 years: n/Elpidia Karn If all of the above answers are "NO", then may proceed with Cephalosporin use.    Pneumococcal Vaccines Other (See Comments)    Reaction unknown      The results of significant diagnostics from this hospitalization (including imaging, microbiology, ancillary and laboratory) are listed below for reference.    Significant Diagnostic Studies: ECHOCARDIOGRAM COMPLETE  Result Date: 07/05/2022    ECHOCARDIOGRAM REPORT   Patient Name:   Michele [name] Date of Exam: 07/05/2022 Medical Rec #:  [id number]          Height:       60.0 in Accession #:    [id number]         Weight:       101.4 lb Date of Birth:  1927/04/09          BSA:          1.399 m Patient Age:    65 years           BP:           138/94 mmHg Patient Gender: F                  HR:           99 bpm. Exam Location:  Inpatient Procedure: 2D Echo, Color Doppler and Cardiac Doppler Indications:    I48.91* Unspecified atrial fibrillation  History:        Patient has prior history of Echocardiogram examinations, most                 recent 04/27/2012. COPD, Arrythmias:Atrial Flutter; Risk                  Factors:Hypertension.  Sonographer:    Raquel Sarna Senior RDCS Referring Phys: Ileene Musa, T  Sonographer Comments: Technically difficult due to thin body habitus. IMPRESSIONS  1. Left ventricular ejection fraction, by estimation, is 65 to 70%. The left ventricle has normal function. Left ventricular endocardial border not optimally defined to evaluate regional wall motion. There is mild left ventricular hypertrophy. Left ventricular diastolic parameters are indeterminate. There is pseudodyskinesis of the inferolateral wall may suggest intraadominal pathology. Given clinical history, may be related to large hiatal hernia.  2. Right ventricular systolic function is mildly reduced. The right ventricular size is mildly enlarged. There is normal pulmonary artery systolic pressure. The estimated right ventricular systolic pressure is 123XX123 mmHg.  3. Left atrial size was moderately dilated.  4. The mitral valve is grossly normal. No evidence of mitral valve regurgitation. No evidence of mitral stenosis.  5. Tricuspid valve regurgitation is mild to moderate.  6. The aortic valve is tricuspid. There is mild calcification of the aortic valve. There is mild thickening of the aortic valve. Aortic valve regurgitation is not visualized. Aortic valve sclerosis/calcification is present, without any evidence of aortic stenosis.  7. The inferior vena cava is normal in size with <50% respiratory variability, suggesting right atrial pressure of 8 mmHg. FINDINGS  Left Ventricle: Left ventricular ejection fraction, by estimation, is 65 to 70%. The left ventricle has normal function. Left ventricular endocardial border not optimally defined to evaluate regional wall motion. The left ventricular internal cavity size was normal in size. There is mild left  ventricular hypertrophy. Pseudodyskinesis of the inferolateral wall may suggest intraadominal pathology. Given clinical history, may be related to large hiatal hernia. Left ventricular diastolic  function could not be evaluated due to atrial fibrillation. Left ventricular diastolic parameters are indeterminate. Right Ventricle: The right ventricular size is mildly enlarged. No increase in right ventricular wall thickness. Right ventricular systolic function is mildly reduced. There is normal pulmonary artery systolic pressure. The tricuspid regurgitant velocity  is 2.54 m/s, and with an assumed right atrial pressure of 8 mmHg, the estimated right ventricular systolic pressure is 123XX123 mmHg. Left Atrium: Left atrial size was moderately dilated. Right Atrium: Right atrial size was normal in size. Pericardium: There is no evidence of pericardial effusion. Mitral Valve: The mitral valve is grossly normal. No evidence of mitral valve regurgitation. No evidence of mitral valve stenosis. Tricuspid Valve: The tricuspid valve is normal in structure. Tricuspid valve regurgitation is mild to moderate. No evidence of tricuspid stenosis. Aortic Valve: The aortic valve is tricuspid. There is mild calcification of the aortic valve. There is mild thickening of the aortic valve. Aortic valve regurgitation is not visualized. Aortic valve sclerosis/calcification is present, without any evidence of aortic stenosis. Pulmonic Valve: The pulmonic valve was normal in structure. Pulmonic valve regurgitation is not visualized. No evidence of pulmonic stenosis. Aorta: The aortic root is normal in size and structure. Venous: The inferior vena cava is normal in size with less than 50% respiratory variability, suggesting right atrial pressure of 8 mmHg. IAS/Shunts: The atrial septum is grossly normal.  LEFT VENTRICLE PLAX 2D LVIDd:         2.00 cm LVIDs:         1.50 cm LV PW:         1.10 cm LV IVS:        1.00 cm LVOT diam:     1.80 cm LV SV:         27 LV SV Index:   20 LVOT Area:     2.54 cm  LEFT ATRIUM             Index LA diam:        2.60 cm 1.86 cm/m LA Vol (A2C):   43.0 ml 30.74 ml/m LA Vol (A4C):   55.0 ml 39.32 ml/m LA  Biplane Vol: 49.3 ml 35.25 ml/m  AORTIC VALVE LVOT Vmax:   82.17 cm/s LVOT Vmean:  53.400 cm/s LVOT VTI:    0.107 m  AORTA Ao Root diam: 2.60 cm Ao Asc diam:  2.90 cm TRICUSPID VALVE TR Peak grad:   25.8 mmHg TR Vmax:        254.00 cm/s  SHUNTS Systemic VTI:  0.11 m Systemic Diam: 1.80 cm Cherlynn Kaiser MD Electronically signed by Cherlynn Kaiser MD Signature Date/Time: 07/05/2022/12:28:47 PM    Final    CT Angio Chest PE W and/or Wo Contrast  Result Date: 07/03/2022 CLINICAL DATA:  Pulmonary embolism (PE) suspected, high prob. COVID positive, tachycardia EXAM: CT ANGIOGRAPHY CHEST WITH CONTRAST TECHNIQUE: Multidetector CT imaging of the chest was performed using the standard protocol during bolus administration of intravenous contrast. Multiplanar CT image reconstructions and MIPs were obtained to evaluate the vascular anatomy. RADIATION DOSE REDUCTION: This exam was performed according to the departmental dose-optimization program which includes automated exposure control, adjustment of the mA and/or kV according to patient size and/or use of iterative reconstruction technique. CONTRAST:  54m OMNIPAQUE IOHEXOL 350 MG/ML SOLN COMPARISON:  None Available. FINDINGS: Cardiovascular: Very small filling defect in  Catrinia Racicot subsegmental branch of Taesha Goodell right upper lobe pulmonary artery compatible with small pulmonary embolus. No additional filling defects. Heart is mildly enlarged. Aorta normal caliber, tortuous with scattered calcifications. Mediastinum/Nodes: No mediastinal, hilar, or axillary adenopathy. Trachea and esophagus are unremarkable. Large hiatal hernia. Thyroid unremarkable. Lungs/Pleura: Compressive atelectasis in the left lower lobe adjacent to the large hiatal hernia. Trace right pleural effusion with minimal right base atelectasis. Upper Abdomen: Reflux of contrast into the IVC and hepatic veins suggesting right heart dysfunction. Musculoskeletal: Chest wall soft tissues are unremarkable. No acute bony  abnormality. Old right rib fractures. Review of the MIP images confirms the above findings. IMPRESSION: Very small filling defect in Aranda Bihm right upper lobe pulmonary arterial subsegmental branch compatible with small pulmonary embolus. This is of questionable clinical significance due to its very small size. Cardiomegaly. Large hiatal hernia. Trace right pleural effusion. Aortic Atherosclerosis (ICD10-I70.0). These results were called by telephone at the time of interpretation on 07/03/2022 at 8:10 pm to provider HAYLEY NAASZ , who verbally acknowledged these results. Electronically Signed   By: Rolm Baptise M.D.   On: 07/03/2022 20:12   DG Chest Portable 1 View  Result Date: 07/03/2022 CLINICAL DATA:  Shortness of breath. EXAM: PORTABLE CHEST 1 VIEW COMPARISON:  06/29/2022 FINDINGS: Lungs are adequately inflated with stable opacification in the left retrocardiac region compatible with known large hiatal hernia. Lungs are otherwise clear. Cardiomediastinal silhouette and remainder of the exam is unchanged. IMPRESSION: 1. No acute findings. 2. Large hiatal hernia. Electronically Signed   By: Marin Olp M.D.   On: 07/03/2022 15:01   CT Angio Chest PE W and/or Wo Contrast  Result Date: 06/30/2022 CLINICAL DATA:  Pulmonary embolism (PE) suspected, high prob. Weakness, fatigue, hypoxia, COVID positive EXAM: CT ANGIOGRAPHY CHEST WITH CONTRAST TECHNIQUE: Multidetector CT imaging of the chest was performed using the standard protocol during bolus administration of intravenous contrast. Multiplanar CT image reconstructions and MIPs were obtained to evaluate the vascular anatomy. RADIATION DOSE REDUCTION: This exam was performed according to the departmental dose-optimization program which includes automated exposure control, adjustment of the mA and/or kV according to patient size and/or use of iterative reconstruction technique. CONTRAST:  26m OMNIPAQUE IOHEXOL 350 MG/ML SOLN COMPARISON:  04/08/2016 FINDINGS:  Cardiovascular: No filling defects in the pulmonary arteries to suggest pulmonary emboli. Heart is normal size. Aorta is normal caliber. Mediastinum/Nodes: No mediastinal, hilar, or axillary adenopathy. Trachea and esophagus are unremarkable. Large hiatal hernia. Thyroid unremarkable. Lungs/Pleura: No confluent airspace opacities or effusions. Upper Abdomen: No acute findings Musculoskeletal: Chest wall soft tissues are unremarkable. No acute bony abnormality. Review of the MIP images confirms the above findings. IMPRESSION: No evidence of pulmonary embolus. Large hiatal hernia. No acute cardiopulmonary disease Electronically Signed   By: KRolm BaptiseM.D.   On: 06/30/2022 03:13   DG Chest Portable 1 View  Result Date: 06/29/2022 CLINICAL DATA:  Shortness of breath EXAM: PORTABLE CHEST 1 VIEW COMPARISON:  06/01/2021 FINDINGS: Heart and mediastinal contours within normal limits. Large hiatal hernia. No confluent airspace opacities or effusions. Thoracolumbar scoliosis and degenerative changes. No acute bony abnormality. IMPRESSION: No active cardiopulmonary disease. Large hiatal hernia. Electronically Signed   By: KRolm BaptiseM.D.   On: 06/29/2022 21:14    Microbiology: Recent Results (from the past 240 hour(s))  Resp panel by RT-PCR (RSV, Flu Jairon Ripberger&B, Covid) Anterior Nasal Swab     Status: Abnormal   Collection Time: 06/29/22  9:18 PM   Specimen: Anterior Nasal Swab  Result Value  Ref Range Status   SARS Coronavirus 2 by RT PCR POSITIVE (Kima Malenfant) NEGATIVE Final    Comment: (NOTE) SARS-CoV-2 target nucleic acids are DETECTED.  The SARS-CoV-2 RNA is generally detectable in upper respiratory specimens during the acute phase of infection. Positive results are indicative of the presence of the identified virus, but do not rule out bacterial infection or co-infection with other pathogens not detected by the test. Clinical correlation with patient history and other diagnostic information is necessary to determine  patient infection status. The expected result is Negative.  Fact Sheet for Patients: EntrepreneurPulse.com.au  Fact Sheet for Healthcare Providers: IncredibleEmployment.be  This test is not yet approved or cleared by the [place] FDA and  has been authorized for detection and/or diagnosis of SARS-CoV-2 by FDA under an Emergency Use Authorization (EUA).  This EUA will remain in effect (meaning this test can be used) for the duration of  the COVID-19 declaration under Section 564(b)(1) of the [place] [address], 21 U.S.C. section 360bbb-3(b)(1), unless the authorization is terminated or revoked sooner.     Influenza Kenzlee Fishburn by PCR NEGATIVE NEGATIVE Final   Influenza B by PCR NEGATIVE NEGATIVE Final    Comment: (NOTE) The Xpert Xpress SARS-CoV-2/FLU/RSV plus assay is intended as an aid in the diagnosis of influenza from Nasopharyngeal swab specimens and should not be used as Orine Goga sole basis for treatment. Nasal washings and aspirates are unacceptable for Xpert Xpress SARS-CoV-2/FLU/RSV testing.  Fact Sheet for Patients: EntrepreneurPulse.com.au  Fact Sheet for Healthcare Providers: IncredibleEmployment.be  This test is not yet approved or cleared by the [place] FDA and has been authorized for detection and/or diagnosis of SARS-CoV-2 by FDA under an Emergency Use Authorization (EUA). This EUA will remain in effect (meaning this test can be used) for the duration of the COVID-19 declaration under Section 564(b)(1) of the Act, 21 U.S.C. section 360bbb-3(b)(1), unless the authorization is terminated or revoked.     Resp Syncytial Virus by PCR NEGATIVE NEGATIVE Final    Comment: (NOTE) Fact Sheet for Patients: EntrepreneurPulse.com.au  Fact Sheet for Healthcare Providers: IncredibleEmployment.be  This test is not yet approved or cleared by the [place] FDA and has been  authorized for detection and/or diagnosis of SARS-CoV-2 by FDA under an Emergency Use Authorization (EUA). This EUA will remain in effect (meaning this test can be used) for the duration of the COVID-19 declaration under Section 564(b)(1) of the Act, 21 U.S.C. section 360bbb-3(b)(1), unless the authorization is terminated or revoked.  Performed at [facility], [place] [address] [place], [place] [address]      Labs: Basic Metabolic Panel: Recent Labs  Lab 06/29/22 2130 06/30/22 0848 07/03/22 1610 07/04/22 0220 07/05/22 0202  NA 131* 133* 134* 130* 132*  K 3.4* 3.6 3.5 3.6 3.5  CL 92* 95* 94* 91* 94*  CO2 27 28 25 27 27  $ GLUCOSE 134* 97 123* 198* 108*  BUN 16 15 27* 20 31*  CREATININE 0.65 0.83 0.96 0.81 0.93  CALCIUM 9.0 8.8* 8.6* 8.5* 8.8*  MG  --  2.1 1.9  --  1.9   Liver Function Tests: Recent Labs  Lab 06/29/22 2130 07/03/22 1610  AST 23 24  ALT 13 13  ALKPHOS 45 39  BILITOT 0.7 0.7  PROT 6.8 5.9*  ALBUMIN 4.0 3.3*   No results for input(s): "LIPASE", "AMYLASE" in the last 168 hours. No results for input(s): "AMMONIA" in the last 168 hours. CBC: Recent Labs  Lab 06/29/22 2130 07/03/22 1518 07/04/22 0220  07/05/22 0202  WBC 7.4 10.7* 7.8 9.4  NEUTROABS 5.7 8.6*  --   --   HGB 13.8 15.1* 14.1 14.2  HCT 41.1 42.6 40.6 40.5  MCV 99.0 96.2 95.8 95.7  PLT 203 299 271 266   Cardiac Enzymes: No results for input(s): "CKTOTAL", "CKMB", "CKMBINDEX", "TROPONINI" in the last 168 hours. BNP: BNP (last 3 results) No results for input(s): "BNP" in the last 8760 hours.  ProBNP (last 3 results) No results for input(s): "PROBNP" in the last 8760 hours.  CBG: Recent Labs  Lab 06/29/22 2238  GLUCAP 105*       Signed:  Fayrene Helper MD.  [facility] 07/05/2022, 12:59 PM

## 2022-07-05 NOTE — Progress Notes (Signed)
Echocardiogram 2D Echocardiogram has been performed.  Oneal Deputy Derriona Branscom RDCS 07/05/2022, 11:54 AM

## 2022-07-05 NOTE — TOC Transition Note (Signed)
Transition of Care [facility]) - CM/SW Discharge Note   Patient Details  Name: Michele [name] MRN: [id number] Date of Birth: Aug 07, 1926  [facility]) CM/SW Contact:  Bjorn Pippin, LCSW Phone Number: 07/05/2022, 1:13 PM   Clinical Narrative:    Patient will DC to: [facility] Anticipated DC date: 07/05/2022 Family notified: Yes, daughter Kensey Transport by: Corey Harold   Per MD patient ready for DC to [facility]. RN, patient, patient's family, and facility notified of DC. Discharge Summary and FL2 sent to facility. RN to call report prior to discharge [phone] and room number is 157. DC packet on chart. Ambulance transport requested for patient.   CSW will sign off for now as social work intervention is no longer needed. Please consult [place] again if new needs arise.    Final next level of care: [place] Barriers to Discharge: No Barriers Identified   Patient Goals and CMS Choice   Choice offered to / list presented to : Adult Children  Discharge Placement                Patient chooses bed at: [facility] Patient to be transferred to facility by: [place] Name of family member notified: Deleta (daughter) Patient and family notified of of transfer: 07/05/22  Discharge Plan and Services Additional resources added to the After Visit Summary for   In-house Referral: Clinical Social Work                                   Social Determinants of Health ([place]) Interventions SDOH Screenings   Food Insecurity: No Food Insecurity (07/01/2022)  Housing: Low Risk  (07/01/2022)  Transportation Needs: No Transportation Needs (07/01/2022)  Utilities: Not At Risk (07/01/2022)  Tobacco Use: Low Risk  (07/03/2022)     Readmission Risk Interventions     No data to display           Beckey Rutter, MSW, LCSWA, LCASA Transitions of Care  Clinical Social Worker I

## 2022-07-05 NOTE — Progress Notes (Signed)
Rounding Note    Patient Name: Michele [name] Date of Encounter: 07/05/2022  [facility] Cardiologist: None   Subjective  Eager to go home. Very HOH.  Inpatient Medications    Scheduled Meds:  acetaminophen  1,000 mg Oral BID   apixaban  10 mg Oral BID   Followed by   Derrill Memo ON 07/10/2022] apixaban  5 mg Oral BID   feeding supplement  1 Container Oral BID BM   losartan  50 mg Oral BID   metoprolol tartrate  25 mg Oral BID   montelukast  10 mg Oral Daily   pantoprazole  40 mg Oral BID AC   Continuous Infusions:  PRN Meds: acetaminophen, guaiFENesin, hydrALAZINE, ipratropium-albuterol, metoprolol tartrate, ondansetron (ZOFRAN) IV, senna-docusate, traZODone   Vital Signs    Vitals:   07/04/22 1225 07/04/22 2006 07/04/22 2203 07/05/22 0333  BP:  (!) 143/116  125/83  Pulse: (!) 104 87  66  Resp:  18 18 19  $ Temp:  (!) 97.4 F (36.3 C) (!) 97.4 F (36.3 C) 98.1 F (36.7 C)  TempSrc:  Oral Oral Oral  SpO2:  98% 96% 96%  Weight:      Height:       No intake or output data in the 24 hours ending 07/05/22 1033    07/04/2022    3:10 AM 07/03/2022    2:26 PM 07/03/2022   11:07 AM  Last 3 Weights  Weight (lbs) 101 lb 6.6 oz 98 lb 1.7 oz 98 lb 3.2 oz  Weight (kg) 46 kg 44.5 kg 44.543 kg      Telemetry    Atrial flutter variable conduction, <2.5 sec pauses- Personally Reviewed  ECG     Atrial flutter with variable conduction- Personally Reviewed  Physical Exam   GEN: No acute distress.   Neck: No JVD Cardiac: irregularly irregular, no murmurs, rubs, or gallops.  Respiratory: Clear to auscultation bilaterally. GI: Soft, nontender, non-distended  MS: No edema; No deformity. Neuro:  Nonfocal  Psych: Normal affect   Labs    High Sensitivity Troponin:   Recent Labs  Lab 06/29/22 0018 06/29/22 2130  TROPONINIHS 25* 29*     Chemistry Recent Labs  Lab 06/29/22 2130 06/30/22 0848 07/03/22 1610 07/04/22 0220 07/05/22 0202  NA 131* 133* 134*  130* 132*  K 3.4* 3.6 3.5 3.6 3.5  CL 92* 95* 94* 91* 94*  CO2 27 28 25 27 27  $ GLUCOSE 134* 97 123* 198* 108*  BUN 16 15 27* 20 31*  CREATININE 0.65 0.83 0.96 0.81 0.93  CALCIUM 9.0 8.8* 8.6* 8.5* 8.8*  MG  --  2.1 1.9  --  1.9  PROT 6.8  --  5.9*  --   --   ALBUMIN 4.0  --  3.3*  --   --   AST 23  --  24  --   --   ALT 13  --  13  --   --   ALKPHOS 45  --  39  --   --   BILITOT 0.7  --  0.7  --   --   GFRNONAA >60 >60 55* >60 57*  ANIONGAP 12 10 15 12 11    $ Lipids No results for input(s): "CHOL", "TRIG", "HDL", "LABVLDL", "LDLCALC", "CHOLHDL" in the last 168 hours.  Hematology Recent Labs  Lab 07/03/22 1518 07/04/22 0220 07/05/22 0202  WBC 10.7* 7.8 9.4  RBC 4.43 4.24 4.23  HGB 15.1* 14.1 14.2  HCT 42.6 40.6  40.5  MCV 96.2 95.8 95.7  MCH 34.1* 33.3 33.6  MCHC 35.4 34.7 35.1  RDW 13.2 12.5 12.4  PLT 299 271 266   Thyroid  Recent Labs  Lab 07/04/22 0220  TSH 0.292*  FREET4 1.09    BNPNo results for input(s): "BNP", "PROBNP" in the last 168 hours.  DDimer No results for input(s): "DDIMER" in the last 168 hours.   Radiology    CT Angio Chest PE W and/or Wo Contrast  Result Date: 07/03/2022 CLINICAL DATA:  Pulmonary embolism (PE) suspected, high prob. COVID positive, tachycardia EXAM: CT ANGIOGRAPHY CHEST WITH CONTRAST TECHNIQUE: Multidetector CT imaging of the chest was performed using the standard protocol during bolus administration of intravenous contrast. Multiplanar CT image reconstructions and MIPs were obtained to evaluate the vascular anatomy. RADIATION DOSE REDUCTION: This exam was performed according to the departmental dose-optimization program which includes automated exposure control, adjustment of the mA and/or kV according to patient size and/or use of iterative reconstruction technique. CONTRAST:  4m OMNIPAQUE IOHEXOL 350 MG/ML SOLN COMPARISON:  None Available. FINDINGS: Cardiovascular: Very small filling defect in a subsegmental branch of a right upper  lobe pulmonary artery compatible with small pulmonary embolus. No additional filling defects. Heart is mildly enlarged. Aorta normal caliber, tortuous with scattered calcifications. Mediastinum/Nodes: No mediastinal, hilar, or axillary adenopathy. Trachea and esophagus are unremarkable. Large hiatal hernia. Thyroid unremarkable. Lungs/Pleura: Compressive atelectasis in the left lower lobe adjacent to the large hiatal hernia. Trace right pleural effusion with minimal right base atelectasis. Upper Abdomen: Reflux of contrast into the IVC and hepatic veins suggesting right heart dysfunction. Musculoskeletal: Chest wall soft tissues are unremarkable. No acute bony abnormality. Old right rib fractures. Review of the MIP images confirms the above findings. IMPRESSION: Very small filling defect in a right upper lobe pulmonary arterial subsegmental branch compatible with small pulmonary embolus. This is of questionable clinical significance due to its very small size. Cardiomegaly. Large hiatal hernia. Trace right pleural effusion. Aortic Atherosclerosis (ICD10-I70.0). These results were called by telephone at the time of interpretation on 07/03/2022 at 8:10 pm to provider HAYLEY NAASZ , who verbally acknowledged these results. Electronically Signed   By: KRolm BaptiseM.D.   On: 07/03/2022 20:12   DG Chest Portable 1 View  Result Date: 07/03/2022 CLINICAL DATA:  Shortness of breath. EXAM: PORTABLE CHEST 1 VIEW COMPARISON:  06/29/2022 FINDINGS: Lungs are adequately inflated with stable opacification in the left retrocardiac region compatible with known large hiatal hernia. Lungs are otherwise clear. Cardiomediastinal silhouette and remainder of the exam is unchanged. IMPRESSION: 1. No acute findings. 2. Large hiatal hernia. Electronically Signed   By: DMarin OlpM.D.   On: 07/03/2022 15:01    Cardiac Studies   Echo pending Patient Profile     APRESLEI FREDENBERGis a 89y.o. female with medical history significant  of HTN, osteoporosis, mild intermittent asthma, GERD, chronic hyponatremia. Cardiology consulted for rapid HR.  Assessment & Plan    Atrial flutter with RVR, new onset  Patient recently diagnosed with COVID-19 presented to the ED from her SNF with palpitations and rapid HR. Most recent ECG with atrial flutter variable conduction but primarily 3:1.  Suspect atrial flutter is 2/2 COVID-19 with concurrent PE. Rates grossly controlled.  Continue rate control with oral Metoprolol . Could also utilize Coreg given hypertension. Continue Eliquis (currently PE dose). Low dose in 7 days (age >>30 weight <60kg). If patient were to have persistent atrial flutter following resolution of COVID-19 and management  of PE, could consider TEE/DCCV although she is currently asymptomatic. Would allow URI to resolve or improve first to have better chance at maintaining SR.  Pulmonary embolism  Patient found with "small filling defect in subsegmental branch of right upper lobe pulmonary artery compatible with small PE. Heart is mildly enlarged."   Given cardiac enlargement on CTA, would recommend TTE today. Continue PE dose Eliquis  Hypertension  Patient on home Losartan 13m BID. BP better this AM but elevated overnight.   Per primary team:  COVID-19  Patient eager to go home. Echocardiogram with normal LV function and mild RV dysfunction/enlargement.  Cardiology will sign off, d/w primary service.   For questions or updates, please contact CR[place]Please consult [email or web address] for contact info under        Signed, GElouise Munroe MD  07/05/2022, 10:33 AM

## 2022-07-07 ENCOUNTER — Non-Acute Institutional Stay (SKILLED_NURSING_FACILITY): Payer: Medicare Other | Admitting: Internal Medicine

## 2022-07-07 ENCOUNTER — Encounter: Payer: Self-pay | Admitting: Internal Medicine

## 2022-07-07 DIAGNOSIS — I2699 Other pulmonary embolism without acute cor pulmonale: Secondary | ICD-10-CM | POA: Diagnosis not present

## 2022-07-07 DIAGNOSIS — U071 COVID-19: Secondary | ICD-10-CM

## 2022-07-07 DIAGNOSIS — R4189 Other symptoms and signs involving cognitive functions and awareness: Secondary | ICD-10-CM

## 2022-07-07 DIAGNOSIS — I10 Essential (primary) hypertension: Secondary | ICD-10-CM

## 2022-07-07 DIAGNOSIS — I4892 Unspecified atrial flutter: Secondary | ICD-10-CM

## 2022-07-07 NOTE — Progress Notes (Unsigned)
Provider:   Location:  Occupational psychologist of Service:  SNF (31)  PCP: Burnard Bunting, MD Patient Care Team: Burnard Bunting, MD as PCP - General (Internal Medicine)  Extended Emergency Contact Information Primary Emergency Contact: [facility] Address: [address]          [place], [place] [address] [name] of [employer] Phone: [phone] Relation: Daughter Interpreter needed? No Secondary Emergency Contact: Franchot Erichsen [place] Mobile Phone: [phone] Relation: Daughter  Code Status: DNR Goals of Care: Advanced Directive information    07/01/2022    2:00 AM  Advanced Directives  Type of Advance Directive Out of facility DNR (pink MOST or yellow form)  Does patient want to make changes to medical advance directive? No - Patient declined  Copy of [place] in Chart? Yes - validated most recent copy scanned in chart (See row information)  Pre-existing out of facility DNR order (yellow form or pink MOST form) Yellow form placed in chart (order not valid for inpatient use)      Chief Complaint  Patient presents with   Acute Visit    HPI: Patient is a 89 y.o. female seen today for [place] to SNF   Patient was admitted in the hospital from 02/04-02/06 for Hypoxia and Wsa foind to be Covid Positive Treated with Paxlovid and Dexamethasone Was send to Rehab But in Rehab patient was having tachycardia and SOB  Send to hospital was found ot have Filling defect in RUL positive fo rPE Started on Eliquis loading dose Also Metoprolol added  Continues to be more confused and weak  Needs help with ADLS Feeling Little Dizzy whe walking with he rwalker No SOB or Chest pain   Past Medical History:  Diagnosis Date   Aortic atherosclerosis ([place]) 08/13/2019   Asthma    COPD (chronic obstructive pulmonary disease) ([place])    Cystocele 08/2011   Diverticulosis    Fractured rib 2017   GERD  (gastroesophageal reflux disease)    Hematuria    GU workup   Hiatal hernia    Hypertension    Melanoma ([place]) 11/2001   Osteoarthritis    Osteoporosis    Scoliosis    UTI (urinary tract infection)    septic   Past Surgical History:  Procedure Laterality Date   HYSTEROSCOPY  06/1998   D&C-Benign   MELANOMA EXCISION      reports that she has never smoked. She has never used smokeless tobacco. She reports that she does not drink alcohol and does not use drugs. Social History   Socioeconomic History   Marital status: Widowed    Spouse name: Not on file   Number of children: Not on file   Years of education: Not on file   Highest education level: Not on file  Occupational History   Not on file  Tobacco Use   Smoking status: Never   Smokeless tobacco: Never  Vaping Use   Vaping Use: Never used  Substance and Sexual Activity   Alcohol use: No    Alcohol/week: 0.0 standard drinks of alcohol    Comment: cocktail    Drug use: No   Sexual activity: Never  Other Topics Concern   Not on file  Social History Narrative   Not on file   Social Determinants of Health   Financial Resource Strain: Not on file  Food Insecurity: No Food Insecurity (07/01/2022)   Hunger Vital Sign    Worried About Running Out  of Food in the Last Year: Never true    [place] in the Last Year: Never true  Transportation Needs: No Transportation Needs (07/01/2022)   PRAPARE - Hydrologist (Medical): No    Lack of Transportation (Non-Medical): No  Physical Activity: Not on file  Stress: Not on file  Social Connections: Not on file  Intimate Partner Violence: Not At Risk (07/01/2022)   Humiliation, Afraid, Rape, and Kick questionnaire    Fear of Current or Ex-Partner: No    Emotionally Abused: No    Physically Abused: No    Sexually Abused: No    Functional Status Survey:    Family History  Problem Relation Age of Onset   Hypertension Father    Heart attack  Father    Colon cancer Neg Hx     Health Maintenance  Topic Date Due   Medicare Annual Wellness (AWV)  Never done   Zoster Vaccines- Shingrix (1 of 2) Never done   MAMMOGRAM  07/11/2016   DTaP/Tdap/Td (4 - Td or Tdap) 06/30/2019   COVID-19 Vaccine (6 - 2023-24 season) 05/30/2022   Pneumonia Vaccine 67+ Years old  Completed   INFLUENZA VACCINE  Completed   DEXA SCAN  Completed   HPV VACCINES  Aged Out    Allergies  Allergen Reactions   Penicillins Other (See Comments)    Reaction unknown Has patient had a PCN reaction causing immediate rash, facial/tongue/throat swelling, SOB or lightheadedness with hypotension: n/a Has patient had a PCN reaction causing severe rash involving mucus membranes or skin necrosis: n/a Has patient had a PCN reaction that required hospitalization: n/a Has patient had a PCN reaction occurring within the last 10 years: n/a If all of the above answers are "NO", then may proceed with Cephalosporin use.    Pneumococcal Vaccines Other (See Comments)    Reaction unknown    Outpatient Encounter Medications as of 07/07/2022  Medication Sig   acetaminophen (TYLENOL) 500 MG tablet Take 1,000 mg by mouth in the morning and at bedtime.   albuterol (VENTOLIN HFA) 108 (90 Base) MCG/ACT inhaler Inhale into the lungs every 6 (six) hours as needed for wheezing or shortness of breath.   apixaban (ELIQUIS) 5 MG TABS tablet Take 2 tablets (10 mg total) by mouth 2 (two) times daily for 5 days.   [START ON 07/10/2022] apixaban (ELIQUIS) 5 MG TABS tablet Take 1 tablet (5 mg total) by mouth 2 (two) times daily. Start this after you complete the 10 mg doses   feeding supplement (BOOST HIGH PROTEIN) LIQD Take 1 Container by mouth 2 (two) times daily between meals.   guaiFENesin (ROBITUSSIN) 100 MG/5ML liquid Take 5 mLs by mouth every 4 (four) hours as needed for cough or to loosen phlegm.   losartan (COZAAR) 50 MG tablet Take 50 mg by mouth 2 (two) times daily.    metoprolol  tartrate (LOPRESSOR) 25 MG tablet Take 1 tablet (25 mg total) by mouth 2 (two) times daily.   montelukast (SINGULAIR) 10 MG tablet Take 10 mg by mouth daily.    Multiple Vitamin (MULTIVITAMIN WITH MINERALS) TABS Take 1 tablet by mouth daily.   ondansetron (ZOFRAN) 4 MG tablet Take 4 mg by mouth every 8 (eight) hours as needed for nausea or vomiting.   pantoprazole (PROTONIX) 40 MG tablet Take 40 mg by mouth 2 (two) times daily before a meal.   No facility-administered encounter medications on file as of 07/07/2022.  Review of Systems  Vitals:   07/07/22 1307  BP: 102/79  Pulse: 81  Resp: 15  Temp: 97.7 F (36.5 C)  SpO2: 92%  Weight: 98 lb (44.5 kg)   Body mass index is 19.14 kg/m. Physical Exam Vitals reviewed.  Constitutional:      Appearance: Normal appearance.  HENT:     Head: Normocephalic.     Nose: Nose normal.     Mouth/Throat:     Mouth: Mucous membranes are moist.     Pharynx: Oropharynx is clear.  Eyes:     Pupils: Pupils are equal, round, and reactive to light.  Cardiovascular:     Rate and Rhythm: Normal rate. Rhythm irregular.     Pulses: Normal pulses.     Heart sounds: Normal heart sounds. No murmur heard. Pulmonary:     Effort: Pulmonary effort is normal.     Breath sounds: Normal breath sounds.  Abdominal:     General: Abdomen is flat. Bowel sounds are normal.     Palpations: Abdomen is soft.  Musculoskeletal:        General: No swelling.     Cervical back: Neck supple.  Skin:    General: Skin is warm.  Neurological:     General: No focal deficit present.     Mental Status: She is alert.  Psychiatric:        Mood and Affect: Mood normal.        Thought Content: Thought content normal.    Labs reviewed: Basic Metabolic Panel: Recent Labs    06/30/22 0848 07/03/22 1610 07/04/22 0220 07/05/22 0202  NA 133* 134* 130* 132*  K 3.6 3.5 3.6 3.5  CL 95* 94* 91* 94*  CO2 28 25 27 27  $ GLUCOSE 97 123* 198* 108*  BUN 15 27* 20 31*   CREATININE 0.83 0.96 0.81 0.93  CALCIUM 8.8* 8.6* 8.5* 8.8*  MG 2.1 1.9  --  1.9   Liver Function Tests: Recent Labs    06/29/22 2130 07/03/22 1610  AST 23 24  ALT 13 13  ALKPHOS 45 39  BILITOT 0.7 0.7  PROT 6.8 5.9*  ALBUMIN 4.0 3.3*   No results for input(s): "LIPASE", "AMYLASE" in the last 8760 hours. No results for input(s): "AMMONIA" in the last 8760 hours. CBC: Recent Labs    06/29/22 2130 07/03/22 1518 07/04/22 0220 07/05/22 0202  WBC 7.4 10.7* 7.8 9.4  NEUTROABS 5.7 8.6*  --   --   HGB 13.8 15.1* 14.1 14.2  HCT 41.1 42.6 40.6 40.5  MCV 99.0 96.2 95.8 95.7  PLT 203 299 271 266   Cardiac Enzymes: No results for input(s): "CKTOTAL", "CKMB", "CKMBINDEX", "TROPONINI" in the last 8760 hours. BNP: Invalid input(s): "POCBNP" No results found for: "HGBA1C" Lab Results  Component Value Date   TSH 0.292 (L) 07/04/2022   No results found for: "VITAMINB12" No results found for: "FOLATE" No results found for: "IRON", "TIBC", "FERRITIN"  Imaging and Procedures obtained prior to SNF admission: CT Angio Chest PE W and/or Wo Contrast  Result Date: 07/03/2022 CLINICAL DATA:  Pulmonary embolism (PE) suspected, high prob. COVID positive, tachycardia EXAM: CT ANGIOGRAPHY CHEST WITH CONTRAST TECHNIQUE: Multidetector CT imaging of the chest was performed using the standard protocol during bolus administration of intravenous contrast. Multiplanar CT image reconstructions and MIPs were obtained to evaluate the vascular anatomy. RADIATION DOSE REDUCTION: This exam was performed according to the departmental dose-optimization program which includes automated exposure control, adjustment of the mA and/or kV  according to patient size and/or use of iterative reconstruction technique. CONTRAST:  39m OMNIPAQUE IOHEXOL 350 MG/ML SOLN COMPARISON:  None Available. FINDINGS: Cardiovascular: Very small filling defect in a subsegmental branch of a right upper lobe pulmonary artery compatible with  small pulmonary embolus. No additional filling defects. Heart is mildly enlarged. Aorta normal caliber, tortuous with scattered calcifications. Mediastinum/Nodes: No mediastinal, hilar, or axillary adenopathy. Trachea and esophagus are unremarkable. Large hiatal hernia. Thyroid unremarkable. Lungs/Pleura: Compressive atelectasis in the left lower lobe adjacent to the large hiatal hernia. Trace right pleural effusion with minimal right base atelectasis. Upper Abdomen: Reflux of contrast into the IVC and hepatic veins suggesting right heart dysfunction. Musculoskeletal: Chest wall soft tissues are unremarkable. No acute bony abnormality. Old right rib fractures. Review of the MIP images confirms the above findings. IMPRESSION: Very small filling defect in a right upper lobe pulmonary arterial subsegmental branch compatible with small pulmonary embolus. This is of questionable clinical significance due to its very small size. Cardiomegaly. Large hiatal hernia. Trace right pleural effusion. Aortic Atherosclerosis (ICD10-I70.0). These results were called by telephone at the time of interpretation on 07/03/2022 at 8:10 pm to provider HAYLEY NAASZ , who verbally acknowledged these results. Electronically Signed   By: KRolm BaptiseM.D.   On: 07/03/2022 20:12   DG Chest Portable 1 View  Result Date: 07/03/2022 CLINICAL DATA:  Shortness of breath. EXAM: PORTABLE CHEST 1 VIEW COMPARISON:  06/29/2022 FINDINGS: Lungs are adequately inflated with stable opacification in the left retrocardiac region compatible with known large hiatal hernia. Lungs are otherwise clear. Cardiomediastinal silhouette and remainder of the exam is unchanged. IMPRESSION: 1. No acute findings. 2. Large hiatal hernia. Electronically Signed   By: DMarin OlpM.D.   On: 07/03/2022 15:01    Assessment/Plan 1. Other acute pulmonary embolism without acute cor pulmonale (HCC) Eliquis  Dopple rpending  No Edema Thought to be provoked due to Covid Plan  for 3 months at 5 mg BID dose  2. Atrial flutter, unspecified type ([place] To continue on Lower dos eof Eliquis Thought to be sec to PE and Covid Per Cardiology Will need folow up with them  3. COVID-19 virus infection Weak and Needing hel p with her ADLS  4. Cognitive changes   5. Essential hypertension BP running low here with her dizziness will redue Cozaa to 25 mg in the morning 6 GERD Protonix   Family/ staff Communication:   Labs/tests ordered:

## 2022-07-08 ENCOUNTER — Non-Acute Institutional Stay (SKILLED_NURSING_FACILITY): Payer: Medicare Other | Admitting: Internal Medicine

## 2022-07-08 ENCOUNTER — Encounter: Payer: Self-pay | Admitting: Internal Medicine

## 2022-07-08 DIAGNOSIS — K458 Other specified abdominal hernia without obstruction or gangrene: Secondary | ICD-10-CM | POA: Insufficient documentation

## 2022-07-08 DIAGNOSIS — R0902 Hypoxemia: Secondary | ICD-10-CM

## 2022-07-08 DIAGNOSIS — U071 COVID-19: Secondary | ICD-10-CM | POA: Diagnosis not present

## 2022-07-08 DIAGNOSIS — R1031 Right lower quadrant pain: Secondary | ICD-10-CM | POA: Insufficient documentation

## 2022-07-08 DIAGNOSIS — R404 Transient alteration of awareness: Secondary | ICD-10-CM | POA: Diagnosis not present

## 2022-07-08 DIAGNOSIS — I4892 Unspecified atrial flutter: Secondary | ICD-10-CM

## 2022-07-08 DIAGNOSIS — F411 Generalized anxiety disorder: Secondary | ICD-10-CM | POA: Insufficient documentation

## 2022-07-08 DIAGNOSIS — I2699 Other pulmonary embolism without acute cor pulmonale: Secondary | ICD-10-CM

## 2022-07-08 DIAGNOSIS — R451 Restlessness and agitation: Secondary | ICD-10-CM | POA: Diagnosis not present

## 2022-07-08 NOTE — Progress Notes (Unsigned)
Location:  [place] Room Number: 157A Place of Service:  SNF [phone]) Provider:  Veleta Miners, MD  Burnard Bunting, MD  Patient Care Team: Burnard Bunting, MD as PCP - General (Internal Medicine)  Extended Emergency Contact Information Primary Emergency Contact: [facility] Address: [address]          [place], [place] [address] Michele [name] of [employer] Phone: [phone] Relation: Daughter Interpreter needed? No Secondary Emergency Contact: Franchot Erichsen [place] of [place] Mobile Phone: [phone] Relation: Daughter  Code Status:  DNR Goals of care: Advanced Directive information    07/08/2022    2:23 PM  Advanced Directives  Does Patient Have a Medical Advance Directive? Yes  Type of Advance Directive Out of facility DNR (pink MOST or yellow form);Healthcare Power of Attorney  Does patient want to make changes to medical advance directive? No - Patient declined  Copy of [place] in Chart? Yes - validated most recent copy scanned in chart (See row information)     Chief Complaint  Patient presents with   Acute Visit    Agitation   Immunizations    Discussed the need for shingles and Tdap vaccine and shingles    Quality Metric Gaps    Discussed the need for AWV and mammogram    HPI:  Pt is a 89 y.o. female seen today for an acute visit for Worsening Confusion , Hypoxia and agitation'  Patient was admitted in the hospital from 02/04-02/06 for Hypoxia and Was  foind to be Covid Positive Treated with Paxlovid and Dexamethasone Was send to Rehab But in Rehab patient was having tachycardia and SOB  Send to hospital was found ot have Filling defect in RU Lobe positive for PE Started on Eliquis loading dose Also Metoprolol added  She was very confused this morning Trying to get up by herself POX 85% on RA refuses to keep oxygen Telling me how she wants to to go home and does not want to  stay in this place Very weak No Cough Fever or Chest pain Past Medical History:  Diagnosis Date   Aortic atherosclerosis ([place]) 08/13/2019   Asthma    COPD (chronic obstructive pulmonary disease) ([place])    Cystocele 08/2011   Diverticulosis    Fractured rib 2017   GERD (gastroesophageal reflux disease)    Hematuria    GU workup   Hiatal hernia    Hypertension    Melanoma ([place]) 11/2001   Osteoarthritis    Osteoporosis    Scoliosis    UTI (urinary tract infection)    septic   Past Surgical History:  Procedure Laterality Date   HYSTEROSCOPY  06/1998   D&C-Benign   MELANOMA EXCISION      Allergies  Allergen Reactions   Penicillins Other (See Comments)    Reaction unknown Has patient had a PCN reaction causing immediate rash, facial/tongue/throat swelling, SOB or lightheadedness with hypotension: n/a Has patient had a PCN reaction causing severe rash involving mucus membranes or skin necrosis: n/a Has patient had a PCN reaction that required hospitalization: n/a Has patient had a PCN reaction occurring within the last 10 years: n/a If all of the above answers are "NO", then may proceed with Cephalosporin use.    Pneumococcal Vaccines Other (See Comments)    Reaction unknown    Outpatient Encounter Medications as of 07/08/2022  Medication Sig   acetaminophen (TYLENOL) 500 MG tablet Take 1,000 mg by mouth in the morning and at  bedtime.   albuterol (VENTOLIN HFA) 108 (90 Base) MCG/ACT inhaler Inhale into the lungs every 6 (six) hours as needed for wheezing or shortness of breath.   apixaban (ELIQUIS) 5 MG TABS tablet Take 2 tablets (10 mg total) by mouth 2 (two) times daily for 5 days.   [START ON 07/10/2022] apixaban (ELIQUIS) 5 MG TABS tablet Take 1 tablet (5 mg total) by mouth 2 (two) times daily. Start this after you complete the 10 mg doses   feeding supplement (BOOST HIGH PROTEIN) LIQD Take 1 Container by mouth 2 (two) times daily between meals.   losartan (COZAAR) 25 MG  tablet Take 25 mg by mouth daily.   losartan (COZAAR) 50 MG tablet Take 50 mg by mouth. 25 mg in am and 50 mg in Pm   metoprolol tartrate (LOPRESSOR) 25 MG tablet Take 1 tablet (25 mg total) by mouth 2 (two) times daily.   montelukast (SINGULAIR) 10 MG tablet Take 10 mg by mouth daily.    Multiple Vitamin (MULTIVITAMIN WITH MINERALS) TABS Take 1 tablet by mouth daily.   ondansetron (ZOFRAN) 4 MG tablet Take 4 mg by mouth every 8 (eight) hours as needed for nausea or vomiting.   pantoprazole (PROTONIX) 40 MG tablet Take 40 mg by mouth 2 (two) times daily before a meal.   guaiFENesin (ROBITUSSIN) 100 MG/5ML liquid Take 5 mLs by mouth every 4 (four) hours as needed for cough or to loosen phlegm. (Patient not taking: Reported on 07/08/2022)   No facility-administered encounter medications on file as of 07/08/2022.    Review of Systems  Unable to perform ROS: Mental status change    Immunization History  Administered Date(s) Administered   Fluad Quad(high Dose 65+) 02/25/2022   Influenza Split 06/27/2009, 02/11/2010, 03/06/2011, 03/24/2012, 03/03/2013, 03/17/2014   Influenza, Quadrivalent, Recombinant, Inj, Pf 02/27/2018, 02/18/2019, 02/29/2020   Influenza,inj,Quad PF,6+ Mos 03/04/2013, 03/17/2014, 03/19/2015   Influenza-Unspecified 03/08/2016, 03/15/2021   Moderna SARS-COV2 Booster Vaccination 04/04/2022   Moderna Sars-Covid-2 Vaccination 04/10/2020, 03/08/2021   Pneumococcal Conjugate-13 11/09/2013   Pneumococcal Polysaccharide-23 06/29/2009   Td 06/29/2009   Td (Adult),5 Lf Tetanus Toxid, Preservative Free 06/29/2009   Td,absorbed, Preservative Free, Adult Use, Lf Unspecified 06/29/2009   Tdap 06/27/2009   Unspecified SARS-COV-2 Vaccination 06/07/2019, 07/05/2019   Zoster, Live 04/14/2012   Pertinent  Health Maintenance Due  Topic Date Due   MAMMOGRAM  07/11/2016   INFLUENZA VACCINE  Completed   DEXA SCAN  Completed      06/02/2021    8:20 PM 06/03/2021   12:00 PM 06/04/2021    12:00 AM 06/04/2021    8:00 AM 06/04/2021    8:40 PM  Fall Risk  (RETIRED) Patient Fall Risk Level High fall risk High fall risk High fall risk High fall risk High fall risk   Functional Status Survey:    Vitals:   07/08/22 1430  BP: (!) 145/76  Pulse: (!) 54  Resp: (!) 21  Temp: 97.7 F (36.5 C)  TempSrc: Temporal  SpO2: 97%  Weight: 98 lb 3.2 oz (44.5 kg)  Height: 5' (1.524 m)   Body mass index is 19.18 kg/m. Physical Exam Vitals reviewed.  Constitutional:      Comments: Confused and Weak  HENT:     Head: Normocephalic.     Nose: Nose normal.     Mouth/Throat:     Mouth: Mucous membranes are moist.     Pharynx: Oropharynx is clear.  Eyes:     Pupils: Pupils are equal, round,  and reactive to light.  Cardiovascular:     Rate and Rhythm: Normal rate and regular rhythm.     Pulses: Normal pulses.     Heart sounds: Normal heart sounds. No murmur heard. Pulmonary:     Effort: Pulmonary effort is normal.     Breath sounds: Normal breath sounds.  Abdominal:     General: Abdomen is flat. Bowel sounds are normal.     Palpations: Abdomen is soft.  Musculoskeletal:        General: No swelling.     Cervical back: Neck supple.  Skin:    General: Skin is warm.  Neurological:     General: No focal deficit present.  Psychiatric:        Mood and Affect: Mood normal.        Thought Content: Thought content normal.     Labs reviewed: Recent Labs    06/30/22 0848 07/03/22 1610 07/04/22 0220 07/05/22 0202  NA 133* 134* 130* 132*  K 3.6 3.5 3.6 3.5  CL 95* 94* 91* 94*  CO2 28 25 27 27  $ GLUCOSE 97 123* 198* 108*  BUN 15 27* 20 31*  CREATININE 0.83 0.96 0.81 0.93  CALCIUM 8.8* 8.6* 8.5* 8.8*  MG 2.1 1.9  --  1.9   Recent Labs    06/29/22 2130 07/03/22 1610  AST 23 24  ALT 13 13  ALKPHOS 45 39  BILITOT 0.7 0.7  PROT 6.8 5.9*  ALBUMIN 4.0 3.3*   Recent Labs    06/29/22 2130 07/03/22 1518 07/04/22 0220 07/05/22 0202  WBC 7.4 10.7* 7.8 9.4  NEUTROABS  5.7 8.6*  --   --   HGB 13.8 15.1* 14.1 14.2  HCT 41.1 42.6 40.6 40.5  MCV 99.0 96.2 95.8 95.7  PLT 203 299 271 266   Lab Results  Component Value Date   TSH 0.292 (L) 07/04/2022   No results found for: "HGBA1C" No results found for: "CHOL", "HDL", "LDLCALC", "LDLDIRECT", "TRIG", "CHOLHDL"  Significant Diagnostic Results in last 30 days:  ECHOCARDIOGRAM COMPLETE  Result Date: 07/05/2022    ECHOCARDIOGRAM REPORT   Patient Name:   Michele [name] Date of Exam: 07/05/2022 Medical Rec #:  [id number]          Height:       60.0 in Accession #:    [id number]         Weight:       101.4 lb Date of Birth:  08/12/1926          BSA:          1.399 m Patient Age:    33 years           BP:           138/94 mmHg Patient Gender: F                  HR:           99 bpm. Exam Location:  Inpatient Procedure: 2D Echo, Color Doppler and Cardiac Doppler Indications:    I48.91* Unspecified atrial fibrillation  History:        Patient has prior history of Echocardiogram examinations, most                 recent 04/27/2012. COPD, Arrythmias:Atrial Flutter; Risk                 Factors:Hypertension.  Sonographer:    Raquel Sarna Senior RDCS Referring Phys: Ileene Musa, T  Sonographer  Comments: Technically difficult due to thin body habitus. IMPRESSIONS  1. Left ventricular ejection fraction, by estimation, is 65 to 70%. The left ventricle has normal function. Left ventricular endocardial border not optimally defined to evaluate regional wall motion. There is mild left ventricular hypertrophy. Left ventricular diastolic parameters are indeterminate. There is pseudodyskinesis of the inferolateral wall may suggest intraadominal pathology. Given clinical history, may be related to large hiatal hernia.  2. Right ventricular systolic function is mildly reduced. The right ventricular size is mildly enlarged. There is normal pulmonary artery systolic pressure. The estimated right ventricular systolic pressure is 123XX123 mmHg.  3. Left atrial  size was moderately dilated.  4. The mitral valve is grossly normal. No evidence of mitral valve regurgitation. No evidence of mitral stenosis.  5. Tricuspid valve regurgitation is mild to moderate.  6. The aortic valve is tricuspid. There is mild calcification of the aortic valve. There is mild thickening of the aortic valve. Aortic valve regurgitation is not visualized. Aortic valve sclerosis/calcification is present, without any evidence of aortic stenosis.  7. The inferior vena cava is normal in size with <50% respiratory variability, suggesting right atrial pressure of 8 mmHg. FINDINGS  Left Ventricle: Left ventricular ejection fraction, by estimation, is 65 to 70%. The left ventricle has normal function. Left ventricular endocardial border not optimally defined to evaluate regional wall motion. The left ventricular internal cavity size was normal in size. There is mild left ventricular hypertrophy. Pseudodyskinesis of the inferolateral wall may suggest intraadominal pathology. Given clinical history, may be related to large hiatal hernia. Left ventricular diastolic function could not be evaluated due to atrial fibrillation. Left ventricular diastolic parameters are indeterminate. Right Ventricle: The right ventricular size is mildly enlarged. No increase in right ventricular wall thickness. Right ventricular systolic function is mildly reduced. There is normal pulmonary artery systolic pressure. The tricuspid regurgitant velocity  is 2.54 m/s, and with an assumed right atrial pressure of 8 mmHg, the estimated right ventricular systolic pressure is 123XX123 mmHg. Left Atrium: Left atrial size was moderately dilated. Right Atrium: Right atrial size was normal in size. Pericardium: There is no evidence of pericardial effusion. Mitral Valve: The mitral valve is grossly normal. No evidence of mitral valve regurgitation. No evidence of mitral valve stenosis. Tricuspid Valve: The tricuspid valve is normal in structure.  Tricuspid valve regurgitation is mild to moderate. No evidence of tricuspid stenosis. Aortic Valve: The aortic valve is tricuspid. There is mild calcification of the aortic valve. There is mild thickening of the aortic valve. Aortic valve regurgitation is not visualized. Aortic valve sclerosis/calcification is present, without any evidence of aortic stenosis. Pulmonic Valve: The pulmonic valve was normal in structure. Pulmonic valve regurgitation is not visualized. No evidence of pulmonic stenosis. Aorta: The aortic root is normal in size and structure. Venous: The inferior vena cava is normal in size with less than 50% respiratory variability, suggesting right atrial pressure of 8 mmHg. IAS/Shunts: The atrial septum is grossly normal.  LEFT VENTRICLE PLAX 2D LVIDd:         2.00 cm LVIDs:         1.50 cm LV PW:         1.10 cm LV IVS:        1.00 cm LVOT diam:     1.80 cm LV SV:         27 LV SV Index:   20 LVOT Area:     2.54 cm  LEFT ATRIUM  Index LA diam:        2.60 cm 1.86 cm/m LA Vol (A2C):   43.0 ml 30.74 ml/m LA Vol (A4C):   55.0 ml 39.32 ml/m LA Biplane Vol: 49.3 ml 35.25 ml/m  AORTIC VALVE LVOT Vmax:   82.17 cm/s LVOT Vmean:  53.400 cm/s LVOT VTI:    0.107 m  AORTA Ao Root diam: 2.60 cm Ao Asc diam:  2.90 cm TRICUSPID VALVE TR Peak grad:   25.8 mmHg TR Vmax:        254.00 cm/s  SHUNTS Systemic VTI:  0.11 m Systemic Diam: 1.80 cm Cherlynn Kaiser MD Electronically signed by Cherlynn Kaiser MD Signature Date/Time: 07/05/2022/12:28:47 PM    Final    CT Angio Chest PE W and/or Wo Contrast  Result Date: 07/03/2022 CLINICAL DATA:  Pulmonary embolism (PE) suspected, high prob. COVID positive, tachycardia EXAM: CT ANGIOGRAPHY CHEST WITH CONTRAST TECHNIQUE: Multidetector CT imaging of the chest was performed using the standard protocol during bolus administration of intravenous contrast. Multiplanar CT image reconstructions and MIPs were obtained to evaluate the vascular anatomy. RADIATION DOSE  REDUCTION: This exam was performed according to the departmental dose-optimization program which includes automated exposure control, adjustment of the mA and/or kV according to patient size and/or use of iterative reconstruction technique. CONTRAST:  70m OMNIPAQUE IOHEXOL 350 MG/ML SOLN COMPARISON:  None Available. FINDINGS: Cardiovascular: Very small filling defect in a subsegmental branch of a right upper lobe pulmonary artery compatible with small pulmonary embolus. No additional filling defects. Heart is mildly enlarged. Aorta normal caliber, tortuous with scattered calcifications. Mediastinum/Nodes: No mediastinal, hilar, or axillary adenopathy. Trachea and esophagus are unremarkable. Large hiatal hernia. Thyroid unremarkable. Lungs/Pleura: Compressive atelectasis in the left lower lobe adjacent to the large hiatal hernia. Trace right pleural effusion with minimal right base atelectasis. Upper Abdomen: Reflux of contrast into the IVC and hepatic veins suggesting right heart dysfunction. Musculoskeletal: Chest wall soft tissues are unremarkable. No acute bony abnormality. Old right rib fractures. Review of the MIP images confirms the above findings. IMPRESSION: Very small filling defect in a right upper lobe pulmonary arterial subsegmental branch compatible with small pulmonary embolus. This is of questionable clinical significance due to its very small size. Cardiomegaly. Large hiatal hernia. Trace right pleural effusion. Aortic Atherosclerosis (ICD10-I70.0). These results were called by telephone at the time of interpretation on 07/03/2022 at 8:10 pm to provider HAYLEY NAASZ , who verbally acknowledged these results. Electronically Signed   By: KRolm BaptiseM.D.   On: 07/03/2022 20:12   DG Chest Portable 1 View  Result Date: 07/03/2022 CLINICAL DATA:  Shortness of breath. EXAM: PORTABLE CHEST 1 VIEW COMPARISON:  06/29/2022 FINDINGS: Lungs are adequately inflated with stable opacification in the left  retrocardiac region compatible with known large hiatal hernia. Lungs are otherwise clear. Cardiomediastinal silhouette and remainder of the exam is unchanged. IMPRESSION: 1. No acute findings. 2. Large hiatal hernia. Electronically Signed   By: DMarin OlpM.D.   On: 07/03/2022 15:01   CT Angio Chest PE W and/or Wo Contrast  Result Date: 06/30/2022 CLINICAL DATA:  Pulmonary embolism (PE) suspected, high prob. Weakness, fatigue, hypoxia, COVID positive EXAM: CT ANGIOGRAPHY CHEST WITH CONTRAST TECHNIQUE: Multidetector CT imaging of the chest was performed using the standard protocol during bolus administration of intravenous contrast. Multiplanar CT image reconstructions and MIPs were obtained to evaluate the vascular anatomy. RADIATION DOSE REDUCTION: This exam was performed according to the departmental dose-optimization program which includes automated exposure control, adjustment of the mA  and/or kV according to patient size and/or use of iterative reconstruction technique. CONTRAST:  72m OMNIPAQUE IOHEXOL 350 MG/ML SOLN COMPARISON:  04/08/2016 FINDINGS: Cardiovascular: No filling defects in the pulmonary arteries to suggest pulmonary emboli. Heart is normal size. Aorta is normal caliber. Mediastinum/Nodes: No mediastinal, hilar, or axillary adenopathy. Trachea and esophagus are unremarkable. Large hiatal hernia. Thyroid unremarkable. Lungs/Pleura: No confluent airspace opacities or effusions. Upper Abdomen: No acute findings Musculoskeletal: Chest wall soft tissues are unremarkable. No acute bony abnormality. Review of the MIP images confirms the above findings. IMPRESSION: No evidence of pulmonary embolus. Large hiatal hernia. No acute cardiopulmonary disease Electronically Signed   By: KRolm BaptiseM.D.   On: 06/30/2022 03:13   DG Chest Portable 1 View  Result Date: 06/29/2022 CLINICAL DATA:  Shortness of breath EXAM: PORTABLE CHEST 1 VIEW COMPARISON:  06/01/2021 FINDINGS: Heart and mediastinal  contours within normal limits. Large hiatal hernia. No confluent airspace opacities or effusions. Thoracolumbar scoliosis and degenerative changes. No acute bony abnormality. IMPRESSION: No active cardiopulmonary disease. Large hiatal hernia. Electronically Signed   By: KRolm BaptiseM.D.   On: 06/29/2022 21:14    Assessment/Plan 1. Hypoxia Not keeping her Oxygen on Combination of recent Covid and PE  2. Transient alteration of awareness Check Labs  Patient refused to let Nurse draw Labs  Her confusion is most likely due to Post Covid and Hypoxia 3. Agitation Will Try Xanax 0.25 mg BID Prn Nurse gave her Xanax and she did calm down and slept after that  4. COVID-19 virus infection Was treated with Paxlovid Is hypoxic but no wheezing Needing help with her ADLS Very weak  5. Other acute pulmonary embolism without acute cor pulmonale (HCC) Eliquis Loading dose then will stay on 5 mg for 3 months before reducing the dose to 2.5 mg  Doppler of LE  pending  No Edema Thought to be provoked due to Covid Plan for 3 months at 5 mg BID dose    6. Atrial flutter, unspecified type [place] To continue on Lower dose of Eliquis after 3 months of treatment Thought to be sec to PE and Covid Per Cardiology Will need folow up with them  on Metoprolol    Family/ staff Communication:   Labs/tests ordered:  CBC,CMP

## 2022-07-09 LAB — CBC AND DIFFERENTIAL
HCT: 41 (ref 36–46)
Hemoglobin: 13.7 (ref 12.0–16.0)
Platelets: 276 10*3/uL (ref 150–400)
WBC: 9.7

## 2022-07-09 LAB — HEPATIC FUNCTION PANEL
ALT: 20 U/L (ref 7–35)
AST: 22 (ref 13–35)
Alkaline Phosphatase: 47 (ref 25–125)
Bilirubin, Total: 0.5

## 2022-07-09 LAB — BASIC METABOLIC PANEL
BUN: 27 — AB (ref 4–21)
CO2: 27 — AB (ref 13–22)
Chloride: 95 — AB (ref 99–108)
Creatinine: 0.8 (ref 0.5–1.1)
Glucose: 56
Potassium: 4 mEq/L (ref 3.5–5.1)
Sodium: 139 (ref 137–147)

## 2022-07-09 LAB — COMPREHENSIVE METABOLIC PANEL
Albumin: 3.5 (ref 3.5–5.0)
Calcium: 9.1 (ref 8.7–10.7)
Globulin: 2.1
eGFR: 70

## 2022-07-09 LAB — CBC: RBC: 4.06 (ref 3.87–5.11)

## 2022-07-10 ENCOUNTER — Encounter: Payer: Self-pay | Admitting: Adult Health

## 2022-07-10 ENCOUNTER — Non-Acute Institutional Stay (SKILLED_NURSING_FACILITY): Payer: Medicare Other | Admitting: Adult Health

## 2022-07-10 DIAGNOSIS — R531 Weakness: Secondary | ICD-10-CM

## 2022-07-10 DIAGNOSIS — R051 Acute cough: Secondary | ICD-10-CM | POA: Diagnosis not present

## 2022-07-10 DIAGNOSIS — U071 COVID-19: Secondary | ICD-10-CM | POA: Diagnosis not present

## 2022-07-10 DIAGNOSIS — R0902 Hypoxemia: Secondary | ICD-10-CM | POA: Diagnosis not present

## 2022-07-10 MED ORDER — IPRATROPIUM-ALBUTEROL 0.5-2.5 (3) MG/3ML IN SOLN: 3.0000 mL | Freq: Two times a day (BID) | RESPIRATORY_TRACT | 0 refills | Status: DC

## 2022-07-10 NOTE — Addendum Note (Signed)
Addended by: Barnie Mort on: 07/10/2022 05:41 PM   Modules accepted: Orders

## 2022-07-10 NOTE — Progress Notes (Addendum)
Location:  [facility] Room Number: [id number] of Service:  SNF ([phone]) Provider: Royal Hawthorn, NP    Patient Care Team: Burnard Bunting, MD as PCP - General (Internal Medicine)  Extended Emergency Contact Information Primary Emergency Contact: [facility] Address: [address]          [place], [place] [address] [name] of [employer] Phone: [phone] Relation: Daughter Interpreter needed? No Secondary Emergency Contact: Franchot Erichsen [place] of [place] Mobile Phone: [phone] Relation: Daughter  Code Status:  DNR  Goals of care: Advanced Directive information    07/10/2022   11:22 AM  Advanced Directives  Does Patient Have a Medical Advance Directive? Yes  Type of Advance Directive Out of facility DNR (pink MOST or yellow form);Healthcare Power of Attorney  Does patient want to make changes to medical advance directive? No - Patient declined  Copy of [place] in Chart? Yes - validated most recent copy scanned in chart (See row information)     Chief Complaint  Patient presents with   Acute Visit    Weakness    HPI:  Pt is a 89 y.o. female seen today for an acute visit for cough and weakness.   She was seen in the ED on 06/29/22 and diagnosed with Covid. Treated with paxlovid and steroids.   Hospitalized 07/03/22-07/05/22 due to tachycardia and diagnosed with aflutter placed on Eliquis and metoprolol. Also found to have a small segmental defect in the RUL c/w with small PE. Also had low TSH.  Dr Lyndel Safe saw her on 2/13 due to weakness, hypoxia(keeps taking off oxygen), confusion. CBC and CMP ordered with no acute findings.  The nurse reported last evening that she was experiencing increased weakness and cough and congestion. She refuses to wear oxygen at times. Refuses to use IS. Was having some agitation and xanax was ordered but this was stopped due to lethargy. She required a hoyer lift for  transfer earlier this week. Prior to covid she was ambulatory and lived [place]   This morning she woke up and felt stronger and was able to walk in the room with her walker. She ate 100% of her breakfast where as prior days she was not eating well.  Sats were 98% on RA this morning. No sob. Still has a cough with some yellow sputum.  She has a hx of asthma and takes singulair. Has albuterol prn ordered but not using. She likely has some underlying MCI that has been worsening with covid. Not agreeable to therapy. Not agreeable to pulmonary toilet.     Past Medical History:  Diagnosis Date   Aortic atherosclerosis ([place]) 08/13/2019   Asthma    COPD (chronic obstructive pulmonary disease) ([place])    Cystocele 08/2011   Diverticulosis    Fractured rib 2017   GERD (gastroesophageal reflux disease)    Hematuria    GU workup   Hiatal hernia    Hypertension    Melanoma ([place]) 11/2001   Osteoarthritis    Osteoporosis    Scoliosis    UTI (urinary tract infection)    septic   Past Surgical History:  Procedure Laterality Date   HYSTEROSCOPY  06/1998   D&C-Benign   MELANOMA EXCISION      Allergies  Allergen Reactions   Penicillins Other (See Comments)    Reaction unknown Has patient had a PCN reaction causing immediate rash, facial/tongue/throat swelling, SOB or lightheadedness with hypotension: n/a Has patient had a PCN reaction  causing severe rash involving mucus membranes or skin necrosis: n/a Has patient had a PCN reaction that required hospitalization: n/a Has patient had a PCN reaction occurring within the last 10 years: n/a If all of the above answers are "NO", then may proceed with Cephalosporin use.    Pneumococcal Vaccines Other (See Comments)    Reaction unknown    Outpatient Encounter Medications as of 07/10/2022  Medication Sig   acetaminophen (TYLENOL) 500 MG tablet Take 1,000 mg by mouth in the morning and at bedtime.   albuterol (VENTOLIN HFA) 108 (90 Base) MCG/ACT  inhaler Inhale into the lungs every 6 (six) hours as needed for wheezing or shortness of breath.   ALPRAZolam (XANAX) 0.25 MG tablet Take 0.25 mg by mouth 2 (two) times daily as needed for anxiety.   apixaban (ELIQUIS) 5 MG TABS tablet Take 2 tablets (10 mg total) by mouth 2 (two) times daily for 5 days.   apixaban (ELIQUIS) 5 MG TABS tablet Take 1 tablet (5 mg total) by mouth 2 (two) times daily. Start this after you complete the 10 mg doses   bisacodyl (DULCOLAX) 10 MG suppository Place 10 mg rectally as needed for moderate constipation.   feeding supplement (BOOST HIGH PROTEIN) LIQD Take 1 Container by mouth 2 (two) times daily between meals.   guaiFENesin (ROBITUSSIN) 100 MG/5ML liquid Take 5 mLs by mouth every 4 (four) hours as needed for cough or to loosen phlegm.   losartan (COZAAR) 25 MG tablet Take 25 mg by mouth daily.   losartan (COZAAR) 50 MG tablet Take 50 mg by mouth. 25 mg in am and 50 mg in Pm   metoprolol tartrate (LOPRESSOR) 25 MG tablet Take 1 tablet (25 mg total) by mouth 2 (two) times daily.   montelukast (SINGULAIR) 10 MG tablet Take 10 mg by mouth daily.    Multiple Vitamin (MULTIVITAMIN WITH MINERALS) TABS Take 1 tablet by mouth daily.   ondansetron (ZOFRAN) 4 MG tablet Take 4 mg by mouth every 8 (eight) hours as needed for nausea or vomiting.   pantoprazole (PROTONIX) 40 MG tablet Take 40 mg by mouth 2 (two) times daily before a meal.   No facility-administered encounter medications on file as of 07/10/2022.    Review of Systems  Constitutional:  Positive for activity change and appetite change. Negative for chills, diaphoresis, fatigue, fever and unexpected weight change.  HENT:  Positive for congestion. Negative for sore throat and trouble swallowing.   Respiratory:  Positive for cough. Negative for shortness of breath and wheezing.   Cardiovascular:  Negative for chest pain, palpitations and leg swelling.  Gastrointestinal:  Negative for abdominal distention,  abdominal pain, constipation and diarrhea.  Genitourinary:  Negative for difficulty urinating and dysuria.  Musculoskeletal:  Positive for gait problem (uses walker). Negative for arthralgias, back pain, joint swelling and myalgias.  Neurological:  Positive for weakness. Negative for dizziness, tremors, seizures, syncope, facial asymmetry, speech difficulty, light-headedness, numbness and headaches.  Psychiatric/Behavioral:  Positive for agitation (improving). Negative for behavioral problems and confusion.     Immunization History  Administered Date(s) Administered   Fluad Quad(high Dose 65+) 02/25/2022   Influenza Split 06/27/2009, 02/11/2010, 03/06/2011, 03/24/2012, 03/03/2013, 03/17/2014   Influenza, Quadrivalent, Recombinant, Inj, Pf 02/27/2018, 02/18/2019, 02/29/2020   Influenza,inj,Quad PF,6+ Mos 03/04/2013, 03/17/2014, 03/19/2015   Influenza-Unspecified 03/08/2016, 03/15/2021   Moderna SARS-COV2 Booster Vaccination 04/04/2022   Moderna Sars-Covid-2 Vaccination 04/10/2020, 03/08/2021   Pneumococcal Conjugate-13 11/09/2013   Pneumococcal Polysaccharide-23 06/29/2009   Td 06/29/2009  Td (Adult),5 Lf Tetanus Toxid, Preservative Free 06/29/2009   Td,absorbed, Preservative Free, Adult Use, Lf Unspecified 06/29/2009   Tdap 06/27/2009   Unspecified SARS-COV-2 Vaccination 06/07/2019, 07/05/2019   Zoster, Live 04/14/2012   Pertinent  Health Maintenance Due  Topic Date Due   MAMMOGRAM  07/11/2016   INFLUENZA VACCINE  Completed   DEXA SCAN  Completed      06/03/2021   12:00 PM 06/04/2021   12:00 AM 06/04/2021    8:00 AM 06/04/2021    8:40 PM 07/10/2022   11:20 AM  Fall Risk  Falls in the past year?     0  Was there an injury with Fall?     0  Fall Risk Category Calculator     0  (RETIRED) Patient Fall Risk Level High fall risk High fall risk High fall risk High fall risk   Patient at Risk for Falls Due to     History of fall(s)  Fall risk Follow up     Falls evaluation completed    Functional Status Survey:    Vitals:   07/10/22 1109  BP: 125/72  Pulse: (!) 56  Resp: 14  Temp: 98 F (36.7 C)  SpO2: 98%  Weight: 98 lb (44.5 kg)  Height: 5' (1.524 m)   Body mass index is 19.14 kg/m. Physical Exam Vitals and nursing note reviewed.  Constitutional:      General: She is not in acute distress.    Appearance: She is not diaphoretic.  HENT:     Head: Normocephalic and atraumatic.     Nose: Congestion present.     Mouth/Throat:     Comments: Yellow phlegm expectorated.  Neck:     Vascular: No JVD.  Cardiovascular:     Rate and Rhythm: Normal rate. Rhythm irregular.     Heart sounds: No murmur heard. Pulmonary:     Effort: Pulmonary effort is normal. No respiratory distress.     Breath sounds: Rhonchi (clears with cough) present. No wheezing.  Musculoskeletal:     Right lower leg: No edema.     Left lower leg: No edema.  Skin:    General: Skin is warm and dry.  Neurological:     General: No focal deficit present.     Mental Status: She is alert. Mental status is at baseline.  Psychiatric:        Mood and Affect: Mood normal.     Labs reviewed: Recent Labs    06/30/22 0848 07/03/22 1610 07/04/22 0220 07/05/22 0202 07/09/22 0530  NA 133* 134* 130* 132* 139  K 3.6 3.5 3.6 3.5 4.0  CL 95* 94* 91* 94* 95*  CO2 28 25 27 27 $ 27*  GLUCOSE 97 123* 198* 108*  --   BUN 15 27* 20 31* 27*  CREATININE 0.83 0.96 0.81 0.93 0.8  CALCIUM 8.8* 8.6* 8.5* 8.8* 9.1  MG 2.1 1.9  --  1.9  --    Recent Labs    06/29/22 2130 07/03/22 1610 07/09/22 0530  AST 23 24 22  $ ALT 13 13 20  $ ALKPHOS 45 39 47  BILITOT 0.7 0.7  --   PROT 6.8 5.9*  --   ALBUMIN 4.0 3.3* 3.5   Recent Labs    06/29/22 2130 07/03/22 1518 07/04/22 0220 07/05/22 0202 07/09/22 0530  WBC 7.4 10.7* 7.8 9.4 9.7  NEUTROABS 5.7 8.6*  --   --   --   HGB 13.8 15.1* 14.1 14.2 13.7  HCT 41.1 42.6 40.6  40.5 41  MCV 99.0 96.2 95.8 95.7  --   PLT 203 299 271 266 276   Lab Results   Component Value Date   TSH 0.292 (L) 07/04/2022   No results found for: "HGBA1C" No results found for: "CHOL", "HDL", "LDLCALC", "LDLDIRECT", "TRIG", "CHOLHDL"  Significant Diagnostic Results in last 30 days:  ECHOCARDIOGRAM COMPLETE  Result Date: 07/05/2022    ECHOCARDIOGRAM REPORT   Patient Name:   JAMARRIA JUBB Date of Exam: 07/05/2022 Medical Rec #:  [id number]          Height:       60.0 in Accession #:    [id number]         Weight:       101.4 lb Date of Birth:  [birth year]          BSA:          1.399 m Patient Age:    33 years           BP:           138/94 mmHg Patient Gender: F                  HR:           99 bpm. Exam Location:  Inpatient Procedure: 2D Echo, Color Doppler and Cardiac Doppler Indications:    I48.91* Unspecified atrial fibrillation  History:        Patient has prior history of Echocardiogram examinations, most                 recent 04/27/2012. COPD, Arrythmias:Atrial Flutter; Risk                 Factors:Hypertension.  Sonographer:    Raquel Sarna Senior RDCS Referring Phys: Ileene Musa, T  Sonographer Comments: Technically difficult due to thin body habitus. IMPRESSIONS  1. Left ventricular ejection fraction, by estimation, is 65 to 70%. The left ventricle has normal function. Left ventricular endocardial border not optimally defined to evaluate regional wall motion. There is mild left ventricular hypertrophy. Left ventricular diastolic parameters are indeterminate. There is pseudodyskinesis of the inferolateral wall may suggest intraadominal pathology. Given clinical history, may be related to large hiatal hernia.  2. Right ventricular systolic function is mildly reduced. The right ventricular size is mildly enlarged. There is normal pulmonary artery systolic pressure. The estimated right ventricular systolic pressure is 123XX123 mmHg.  3. Left atrial size was moderately dilated.  4. The mitral valve is grossly normal. No evidence of mitral valve regurgitation. No evidence of mitral  stenosis.  5. Tricuspid valve regurgitation is mild to moderate.  6. The aortic valve is tricuspid. There is mild calcification of the aortic valve. There is mild thickening of the aortic valve. Aortic valve regurgitation is not visualized. Aortic valve sclerosis/calcification is present, without any evidence of aortic stenosis.  7. The inferior vena cava is normal in size with <50% respiratory variability, suggesting right atrial pressure of 8 mmHg. FINDINGS  Left Ventricle: Left ventricular ejection fraction, by estimation, is 65 to 70%. The left ventricle has normal function. Left ventricular endocardial border not optimally defined to evaluate regional wall motion. The left ventricular internal cavity size was normal in size. There is mild left ventricular hypertrophy. Pseudodyskinesis of the inferolateral wall may suggest intraadominal pathology. Given clinical history, may be related to large hiatal hernia. Left ventricular diastolic function could not be evaluated due to atrial fibrillation. Left ventricular diastolic parameters are indeterminate. Right Ventricle: The right  ventricular size is mildly enlarged. No increase in right ventricular wall thickness. Right ventricular systolic function is mildly reduced. There is normal pulmonary artery systolic pressure. The tricuspid regurgitant velocity  is 2.54 m/s, and with an assumed right atrial pressure of 8 mmHg, the estimated right ventricular systolic pressure is 123XX123 mmHg. Left Atrium: Left atrial size was moderately dilated. Right Atrium: Right atrial size was normal in size. Pericardium: There is no evidence of pericardial effusion. Mitral Valve: The mitral valve is grossly normal. No evidence of mitral valve regurgitation. No evidence of mitral valve stenosis. Tricuspid Valve: The tricuspid valve is normal in structure. Tricuspid valve regurgitation is mild to moderate. No evidence of tricuspid stenosis. Aortic Valve: The aortic valve is tricuspid.  There is mild calcification of the aortic valve. There is mild thickening of the aortic valve. Aortic valve regurgitation is not visualized. Aortic valve sclerosis/calcification is present, without any evidence of aortic stenosis. Pulmonic Valve: The pulmonic valve was normal in structure. Pulmonic valve regurgitation is not visualized. No evidence of pulmonic stenosis. Aorta: The aortic root is normal in size and structure. Venous: The inferior vena cava is normal in size with less than 50% respiratory variability, suggesting right atrial pressure of 8 mmHg. IAS/Shunts: The atrial septum is grossly normal.  LEFT VENTRICLE PLAX 2D LVIDd:         2.00 cm LVIDs:         1.50 cm LV PW:         1.10 cm LV IVS:        1.00 cm LVOT diam:     1.80 cm LV SV:         27 LV SV Index:   20 LVOT Area:     2.54 cm  LEFT ATRIUM             Index LA diam:        2.60 cm 1.86 cm/m LA Vol (A2C):   43.0 ml 30.74 ml/m LA Vol (A4C):   55.0 ml 39.32 ml/m LA Biplane Vol: 49.3 ml 35.25 ml/m  AORTIC VALVE LVOT Vmax:   82.17 cm/s LVOT Vmean:  53.400 cm/s LVOT VTI:    0.107 m  AORTA Ao Root diam: 2.60 cm Ao Asc diam:  2.90 cm TRICUSPID VALVE TR Peak grad:   25.8 mmHg TR Vmax:        254.00 cm/s  SHUNTS Systemic VTI:  0.11 m Systemic Diam: 1.80 cm Cherlynn Kaiser MD Electronically signed by Cherlynn Kaiser MD Signature Date/Time: 07/05/2022/12:28:47 PM    Final    CT Angio Chest PE W and/or Wo Contrast  Result Date: 07/03/2022 CLINICAL DATA:  Pulmonary embolism (PE) suspected, high prob. COVID positive, tachycardia EXAM: CT ANGIOGRAPHY CHEST WITH CONTRAST TECHNIQUE: Multidetector CT imaging of the chest was performed using the standard protocol during bolus administration of intravenous contrast. Multiplanar CT image reconstructions and MIPs were obtained to evaluate the vascular anatomy. RADIATION DOSE REDUCTION: This exam was performed according to the departmental dose-optimization program which includes automated exposure control,  adjustment of the mA and/or kV according to patient size and/or use of iterative reconstruction technique. CONTRAST:  17m OMNIPAQUE IOHEXOL 350 MG/ML SOLN COMPARISON:  None Available. FINDINGS: Cardiovascular: Very small filling defect in a subsegmental branch of a right upper lobe pulmonary artery compatible with small pulmonary embolus. No additional filling defects. Heart is mildly enlarged. Aorta normal caliber, tortuous with scattered calcifications. Mediastinum/Nodes: No mediastinal, hilar, or axillary adenopathy. Trachea and esophagus are unremarkable. Large  hiatal hernia. Thyroid unremarkable. Lungs/Pleura: Compressive atelectasis in the left lower lobe adjacent to the large hiatal hernia. Trace right pleural effusion with minimal right base atelectasis. Upper Abdomen: Reflux of contrast into the IVC and hepatic veins suggesting right heart dysfunction. Musculoskeletal: Chest wall soft tissues are unremarkable. No acute bony abnormality. Old right rib fractures. Review of the MIP images confirms the above findings. IMPRESSION: Very small filling defect in a right upper lobe pulmonary arterial subsegmental branch compatible with small pulmonary embolus. This is of questionable clinical significance due to its very small size. Cardiomegaly. Large hiatal hernia. Trace right pleural effusion. Aortic Atherosclerosis (ICD10-I70.0). These results were called by telephone at the time of interpretation on 07/03/2022 at 8:10 pm to provider HAYLEY NAASZ , who verbally acknowledged these results. Electronically Signed   By: Rolm Baptise M.D.   On: 07/03/2022 20:12   DG Chest Portable 1 View  Result Date: 07/03/2022 CLINICAL DATA:  Shortness of breath. EXAM: PORTABLE CHEST 1 VIEW COMPARISON:  06/29/2022 FINDINGS: Lungs are adequately inflated with stable opacification in the left retrocardiac region compatible with known large hiatal hernia. Lungs are otherwise clear. Cardiomediastinal silhouette and remainder of the  exam is unchanged. IMPRESSION: 1. No acute findings. 2. Large hiatal hernia. Electronically Signed   By: Marin Olp M.D.   On: 07/03/2022 15:01   CT Angio Chest PE W and/or Wo Contrast  Result Date: 06/30/2022 CLINICAL DATA:  Pulmonary embolism (PE) suspected, high prob. Weakness, fatigue, hypoxia, COVID positive EXAM: CT ANGIOGRAPHY CHEST WITH CONTRAST TECHNIQUE: Multidetector CT imaging of the chest was performed using the standard protocol during bolus administration of intravenous contrast. Multiplanar CT image reconstructions and MIPs were obtained to evaluate the vascular anatomy. RADIATION DOSE REDUCTION: This exam was performed according to the departmental dose-optimization program which includes automated exposure control, adjustment of the mA and/or kV according to patient size and/or use of iterative reconstruction technique. CONTRAST:  35m OMNIPAQUE IOHEXOL 350 MG/ML SOLN COMPARISON:  04/08/2016 FINDINGS: Cardiovascular: No filling defects in the pulmonary arteries to suggest pulmonary emboli. Heart is normal size. Aorta is normal caliber. Mediastinum/Nodes: No mediastinal, hilar, or axillary adenopathy. Trachea and esophagus are unremarkable. Large hiatal hernia. Thyroid unremarkable. Lungs/Pleura: No confluent airspace opacities or effusions. Upper Abdomen: No acute findings Musculoskeletal: Chest wall soft tissues are unremarkable. No acute bony abnormality. Review of the MIP images confirms the above findings. IMPRESSION: No evidence of pulmonary embolus. Large hiatal hernia. No acute cardiopulmonary disease Electronically Signed   By: KRolm BaptiseM.D.   On: 06/30/2022 03:13   DG Chest Portable 1 View  Result Date: 06/29/2022 CLINICAL DATA:  Shortness of breath EXAM: PORTABLE CHEST 1 VIEW COMPARISON:  06/01/2021 FINDINGS: Heart and mediastinal contours within normal limits. Large hiatal hernia. No confluent airspace opacities or effusions. Thoracolumbar scoliosis and degenerative changes.  No acute bony abnormality. IMPRESSION: No active cardiopulmonary disease. Large hiatal hernia. Electronically Signed   By: KRolm BaptiseM.D.   On: 06/29/2022 21:14    Assessment/Plan  1. COVID-19 virus infection Off isolation Improved today, more alert, less weak, eating better Continues with cough  2. Acute cough Now that she is off isolation will try duoneb tid x 3 days and Robitussin tid x 3 days If not improving will rexray  3. Weakness Improving, off hoyer lift Therapy will try to work with her again if she will agree  4. Hypoxia Improving, sat 98% RA   Family/ staff Communication: nurse  Labs/tests ordered:  NA

## 2022-07-10 NOTE — Addendum Note (Signed)
Addended by: Barnie Mort on: 07/10/2022 06:12 PM   Modules accepted: Orders

## 2022-07-14 ENCOUNTER — Non-Acute Institutional Stay (SKILLED_NURSING_FACILITY): Payer: Medicare Other | Admitting: Adult Health

## 2022-07-14 ENCOUNTER — Encounter: Payer: Self-pay | Admitting: Adult Health

## 2022-07-14 DIAGNOSIS — R531 Weakness: Secondary | ICD-10-CM | POA: Diagnosis not present

## 2022-07-14 DIAGNOSIS — I4892 Unspecified atrial flutter: Secondary | ICD-10-CM

## 2022-07-14 DIAGNOSIS — R4586 Emotional lability: Secondary | ICD-10-CM

## 2022-07-14 DIAGNOSIS — Z789 Other specified health status: Secondary | ICD-10-CM | POA: Diagnosis not present

## 2022-07-14 DIAGNOSIS — K219 Gastro-esophageal reflux disease without esophagitis: Secondary | ICD-10-CM

## 2022-07-14 DIAGNOSIS — R7989 Other specified abnormal findings of blood chemistry: Secondary | ICD-10-CM

## 2022-07-14 DIAGNOSIS — U071 COVID-19: Secondary | ICD-10-CM

## 2022-07-14 DIAGNOSIS — N39498 Other specified urinary incontinence: Secondary | ICD-10-CM

## 2022-07-14 DIAGNOSIS — R413 Other amnesia: Secondary | ICD-10-CM

## 2022-07-14 DIAGNOSIS — I2699 Other pulmonary embolism without acute cor pulmonale: Secondary | ICD-10-CM

## 2022-07-14 DIAGNOSIS — I10 Essential (primary) hypertension: Secondary | ICD-10-CM

## 2022-07-14 NOTE — Progress Notes (Signed)
Location:  [name] Room Number: 157A Place of Service:  SNF [phone]) Provider:  Royal Hawthorn, NP  Burnard Bunting, MD  Patient Care Team: Burnard Bunting, MD as PCP - General (Internal Medicine)  Extended Emergency Contact Information Primary Emergency Contact: [facility] Address: [address]          [place], [place] [address] [name] of [employer] Phone: [phone] Relation: Daughter Interpreter needed? No Secondary Emergency Contact: Franchot Erichsen [place] of [place] Mobile Phone: [phone] Relation: Daughter  Code Status:  DNR Goals of care: Advanced Directive information    07/14/2022    9:21 AM  Advanced Directives  Does Patient Have a Medical Advance Directive? Yes  Type of Advance Directive Out of facility DNR (pink MOST or yellow form);Healthcare Power of Attorney  Does patient want to make changes to medical advance directive? No - Patient declined  Copy of [place] in Chart? Yes - validated most recent copy scanned in chart (See row information)     Chief Complaint  Patient presents with   Acute Visit    Patient is being discharged    Immunizations    Discussed the need for Tdap and Shingles Vaccine NCIR verified    Quality Metric Gaps    Disucced the need for mammogram and AWV    HPI:  Pt is a 89 y.o. female seen today for an acute visit for discharge from skilled care rehab back to independent living.   She was seen in the ED on 06/29/22 and diagnosed with Covid. Treated with paxlovid and steroids. She came to skilled rehab due to increased care needs.    Hospitalized 07/03/22-07/05/22 due to tachycardia and diagnosed with aflutter placed on Eliquis and metoprolol. Also found to have a small segmental defect in the RUL c/w with small PE. Also had low TSH.  During her rehab stay she had some wheezing, cough, congestion which responded to duonebs and robitussin.   Also had  delirium and agitation in the hospital and during her rehab stay. Tried xanax but felt to groggy. Behavior improved. Has underlying memory loss.   MMSE 25/30 with poor recall in Jan 2024 during her last rehab for a rib fracture.   She had refused to work with therapy but became stronger and is now walking independently. She continues to have memory loss, some intermittent incontinence, and self care deficit related to hygiene. She is adamant that she is ready to go home and will be getting homecare. She agreed to have therapy assess her as well.   Her BP and HR are well controlled now. She does have some DOE since having covid. Sats are in the 90s on room air.    There are reports that she can be moody, easily angered, anxious and depressed for the past month.  Past Medical History:  Diagnosis Date   Aortic atherosclerosis ([place]) 08/13/2019   Asthma    COPD (chronic obstructive pulmonary disease) ([place])    Cystocele 08/2011   Diverticulosis    Fractured rib 2017   GERD (gastroesophageal reflux disease)    Hematuria    GU workup   Hiatal hernia    Hypertension    Melanoma ([place]) 11/2001   Osteoarthritis    Osteoporosis    Scoliosis    UTI (urinary tract infection)    septic   Past Surgical History:  Procedure Laterality Date   HYSTEROSCOPY  06/1998   D&C-Benign   MELANOMA EXCISION  Allergies  Allergen Reactions   Penicillins Other (See Comments)    Reaction unknown Has patient had a PCN reaction causing immediate rash, facial/tongue/throat swelling, SOB or lightheadedness with hypotension: n/a Has patient had a PCN reaction causing severe rash involving mucus membranes or skin necrosis: n/a Has patient had a PCN reaction that required hospitalization: n/a Has patient had a PCN reaction occurring within the last 10 years: n/a If all of the above answers are "NO", then may proceed with Cephalosporin use.    Pneumococcal Vaccines Other (See Comments)    Reaction unknown     Outpatient Encounter Medications as of 07/14/2022  Medication Sig   acetaminophen (TYLENOL) 500 MG tablet Take 1,000 mg by mouth in the morning and at bedtime.   apixaban (ELIQUIS) 5 MG TABS tablet Take 1 tablet (5 mg total) by mouth 2 (two) times daily. Start this after you complete the 10 mg doses   feeding supplement (BOOST HIGH PROTEIN) LIQD Take 1 Container by mouth 2 (two) times daily between meals.   losartan (COZAAR) 25 MG tablet Take 25 mg by mouth daily.   losartan (COZAAR) 50 MG tablet Take 50 mg by mouth. 25 mg in am and 50 mg in Pm   metoprolol tartrate (LOPRESSOR) 25 MG tablet Take 1 tablet (25 mg total) by mouth 2 (two) times daily.   montelukast (SINGULAIR) 10 MG tablet Take 10 mg by mouth daily.    Multiple Vitamin (MULTIVITAMIN WITH MINERALS) TABS Take 1 tablet by mouth daily.   ondansetron (ZOFRAN) 4 MG tablet Take 4 mg by mouth every 8 (eight) hours as needed for nausea or vomiting.   pantoprazole (PROTONIX) 40 MG tablet Take 40 mg by mouth 2 (two) times daily before a meal.   albuterol (VENTOLIN HFA) 108 (90 Base) MCG/ACT inhaler Inhale into the lungs every 6 (six) hours as needed for wheezing or shortness of breath. (Patient not taking: Reported on 07/14/2022)   apixaban (ELIQUIS) 5 MG TABS tablet Take 2 tablets (10 mg total) by mouth 2 (two) times daily for 5 days.   bisacodyl (DULCOLAX) 10 MG suppository Place 10 mg rectally as needed for moderate constipation. (Patient not taking: Reported on 07/14/2022)   guaiFENesin (ROBITUSSIN) 100 MG/5ML liquid Take 5 mLs by mouth every 4 (four) hours as needed for cough or to loosen phlegm. (Patient not taking: Reported on 07/14/2022)   ipratropium-albuterol (DUONEB) 0.5-2.5 (3) MG/3ML SOLN Take 3 mLs by nebulization in the morning and at bedtime for 3 days.   No facility-administered encounter medications on file as of 07/14/2022.    Review of Systems  Constitutional:  Negative for activity change, appetite change, chills,  diaphoresis, fatigue, fever and unexpected weight change.  HENT:  Negative for congestion.   Respiratory:  Positive for cough. Negative for shortness of breath and wheezing.   Cardiovascular:  Negative for chest pain, palpitations and leg swelling.  Gastrointestinal:  Negative for abdominal distention, abdominal pain, constipation and diarrhea.  Genitourinary:  Negative for difficulty urinating and dysuria.  Musculoskeletal:  Positive for gait problem (has walker). Negative for arthralgias, back pain, joint swelling and myalgias.  Neurological:  Negative for dizziness, tremors, seizures, syncope, facial asymmetry, speech difficulty, weakness, light-headedness, numbness and headaches.  Psychiatric/Behavioral:  Positive for dysphoric mood. Negative for agitation, behavioral problems and confusion.        Easily angered.      Immunization History  Administered Date(s) Administered   Fluad Quad(high Dose 65+) 02/25/2022   Influenza Split 06/27/2009, 02/11/2010,  03/06/2011, 03/24/2012, 03/03/2013, 03/17/2014   Influenza, Quadrivalent, Recombinant, Inj, Pf 02/27/2018, 02/18/2019, 02/29/2020   Influenza,inj,Quad PF,6+ Mos 03/04/2013, 03/17/2014, 03/19/2015   Influenza-Unspecified 03/08/2016, 03/15/2021   Moderna SARS-COV2 Booster Vaccination 04/04/2022   Moderna Sars-Covid-2 Vaccination 04/10/2020, 03/08/2021   Pneumococcal Conjugate-13 11/09/2013   Pneumococcal Polysaccharide-23 06/29/2009   Td 06/29/2009   Td (Adult),5 Lf Tetanus Toxid, Preservative Free 06/29/2009   Td,absorbed, Preservative Free, Adult Use, Lf Unspecified 06/29/2009   Tdap 06/27/2009   Unspecified SARS-COV-2 Vaccination 06/07/2019, 07/05/2019   Zoster, Live 04/14/2012   Pertinent  Health Maintenance Due  Topic Date Due   MAMMOGRAM  07/11/2016   INFLUENZA VACCINE  Completed   DEXA SCAN  Completed      06/03/2021   12:00 PM 06/04/2021   12:00 AM 06/04/2021    8:00 AM 06/04/2021    8:40 PM 07/10/2022   11:20 AM  Fall  Risk  Falls in the past year?     0  Was there an injury with Fall?     0  Fall Risk Category Calculator     0  (RETIRED) Patient Fall Risk Level High fall risk High fall risk High fall risk High fall risk   Patient at Risk for Falls Due to     History of fall(s)  Fall risk Follow up     Falls evaluation completed   Functional Status Survey:    Vitals:   07/14/22 0916  BP: 137/81  Pulse: 63  Resp: 17  Temp: 98.2 F (36.8 C)  TempSrc: Temporal  SpO2: 96%  Weight: 98 lb (44.5 kg)  Height: 5' (1.524 m)   Body mass index is 19.14 kg/m. Physical Exam Vitals and nursing note reviewed.  Constitutional:      General: She is not in acute distress.    Appearance: She is not diaphoretic.  HENT:     Head: Normocephalic and atraumatic.     Ears:     Comments: HOH Neck:     Vascular: No JVD.  Cardiovascular:     Rate and Rhythm: Regular rhythm. Bradycardia present.     Heart sounds: No murmur heard.    Comments: 73 Pulmonary:     Effort: Pulmonary effort is normal. No respiratory distress.     Breath sounds: Normal breath sounds. No wheezing.  Abdominal:     General: There is no distension.  Musculoskeletal:     Right lower leg: No edema.     Left lower leg: No edema.  Skin:    General: Skin is warm and dry.  Neurological:     Mental Status: She is alert and oriented to person, place, and time.  Psychiatric:        Mood and Affect: Mood normal.     Labs reviewed: Recent Labs    06/30/22 0848 07/03/22 1610 07/04/22 0220 07/05/22 0202 07/09/22 0530  NA 133* 134* 130* 132* 139  K 3.6 3.5 3.6 3.5 4.0  CL 95* 94* 91* 94* 95*  CO2 28 25 27 27 $ 27*  GLUCOSE 97 123* 198* 108*  --   BUN 15 27* 20 31* 27*  CREATININE 0.83 0.96 0.81 0.93 0.8  CALCIUM 8.8* 8.6* 8.5* 8.8* 9.1  MG 2.1 1.9  --  1.9  --    Recent Labs    06/29/22 2130 07/03/22 1610 07/09/22 0530  AST 23 24 22  $ ALT 13 13 20  $ ALKPHOS 45 39 47  BILITOT 0.7 0.7  --   PROT 6.8 5.9*  --  ALBUMIN 4.0  3.3* 3.5   Recent Labs    06/29/22 2130 07/03/22 1518 07/04/22 0220 07/05/22 0202 07/09/22 0530  WBC 7.4 10.7* 7.8 9.4 9.7  NEUTROABS 5.7 8.6*  --   --   --   HGB 13.8 15.1* 14.1 14.2 13.7  HCT 41.1 42.6 40.6 40.5 41  MCV 99.0 96.2 95.8 95.7  --   PLT 203 299 271 266 276   Lab Results  Component Value Date   TSH 0.292 (L) 07/04/2022   No results found for: "HGBA1C" No results found for: "CHOL", "HDL", "LDLCALC", "LDLDIRECT", "TRIG", "CHOLHDL"  Significant Diagnostic Results in last 30 days:  ECHOCARDIOGRAM COMPLETE  Result Date: 07/05/2022    ECHOCARDIOGRAM REPORT   Patient Name:   Michele [name] Date of Exam: 07/05/2022 Medical Rec #:  [id number]          Height:       60.0 in Accession #:    [id number]         Weight:       101.4 lb Date of Birth:  [birth year]          BSA:          1.399 m Patient Age:    74 years           BP:           138/94 mmHg Patient Gender: F                  HR:           99 bpm. Exam Location:  Inpatient Procedure: 2D Echo, Color Doppler and Cardiac Doppler Indications:    I48.91* Unspecified atrial fibrillation  History:        Patient has prior history of Echocardiogram examinations, most                 recent 04/27/2012. COPD, Arrythmias:Atrial Flutter; Risk                 Factors:Hypertension.  Sonographer:    Raquel Sarna Senior RDCS Referring Phys: Ileene Musa, T  Sonographer Comments: Technically difficult due to thin body habitus. IMPRESSIONS  1. Left ventricular ejection fraction, by estimation, is 65 to 70%. The left ventricle has normal function. Left ventricular endocardial border not optimally defined to evaluate regional wall motion. There is mild left ventricular hypertrophy. Left ventricular diastolic parameters are indeterminate. There is pseudodyskinesis of the inferolateral wall may suggest intraadominal pathology. Given clinical history, may be related to large hiatal hernia.  2. Right ventricular systolic function is mildly reduced. The right  ventricular size is mildly enlarged. There is normal pulmonary artery systolic pressure. The estimated right ventricular systolic pressure is 123XX123 mmHg.  3. Left atrial size was moderately dilated.  4. The mitral valve is grossly normal. No evidence of mitral valve regurgitation. No evidence of mitral stenosis.  5. Tricuspid valve regurgitation is mild to moderate.  6. The aortic valve is tricuspid. There is mild calcification of the aortic valve. There is mild thickening of the aortic valve. Aortic valve regurgitation is not visualized. Aortic valve sclerosis/calcification is present, without any evidence of aortic stenosis.  7. The inferior vena cava is normal in size with <50% respiratory variability, suggesting right atrial pressure of 8 mmHg. FINDINGS  Left Ventricle: Left ventricular ejection fraction, by estimation, is 65 to 70%. The left ventricle has normal function. Left ventricular endocardial border not optimally defined to evaluate regional wall motion. The left ventricular internal  cavity size was normal in size. There is mild left ventricular hypertrophy. Pseudodyskinesis of the inferolateral wall may suggest intraadominal pathology. Given clinical history, may be related to large hiatal hernia. Left ventricular diastolic function could not be evaluated due to atrial fibrillation. Left ventricular diastolic parameters are indeterminate. Right Ventricle: The right ventricular size is mildly enlarged. No increase in right ventricular wall thickness. Right ventricular systolic function is mildly reduced. There is normal pulmonary artery systolic pressure. The tricuspid regurgitant velocity  is 2.54 m/s, and with an assumed right atrial pressure of 8 mmHg, the estimated right ventricular systolic pressure is 123XX123 mmHg. Left Atrium: Left atrial size was moderately dilated. Right Atrium: Right atrial size was normal in size. Pericardium: There is no evidence of pericardial effusion. Mitral Valve: The mitral  valve is grossly normal. No evidence of mitral valve regurgitation. No evidence of mitral valve stenosis. Tricuspid Valve: The tricuspid valve is normal in structure. Tricuspid valve regurgitation is mild to moderate. No evidence of tricuspid stenosis. Aortic Valve: The aortic valve is tricuspid. There is mild calcification of the aortic valve. There is mild thickening of the aortic valve. Aortic valve regurgitation is not visualized. Aortic valve sclerosis/calcification is present, without any evidence of aortic stenosis. Pulmonic Valve: The pulmonic valve was normal in structure. Pulmonic valve regurgitation is not visualized. No evidence of pulmonic stenosis. Aorta: The aortic root is normal in size and structure. Venous: The inferior vena cava is normal in size with less than 50% respiratory variability, suggesting right atrial pressure of 8 mmHg. IAS/Shunts: The atrial septum is grossly normal.  LEFT VENTRICLE PLAX 2D LVIDd:         2.00 cm LVIDs:         1.50 cm LV PW:         1.10 cm LV IVS:        1.00 cm LVOT diam:     1.80 cm LV SV:         27 LV SV Index:   20 LVOT Area:     2.54 cm  LEFT ATRIUM             Index LA diam:        2.60 cm 1.86 cm/m LA Vol (A2C):   43.0 ml 30.74 ml/m LA Vol (A4C):   55.0 ml 39.32 ml/m LA Biplane Vol: 49.3 ml 35.25 ml/m  AORTIC VALVE LVOT Vmax:   82.17 cm/s LVOT Vmean:  53.400 cm/s LVOT VTI:    0.107 m  AORTA Ao Root diam: 2.60 cm Ao Asc diam:  2.90 cm TRICUSPID VALVE TR Peak grad:   25.8 mmHg TR Vmax:        254.00 cm/s  SHUNTS Systemic VTI:  0.11 m Systemic Diam: 1.80 cm Cherlynn Kaiser MD Electronically signed by Cherlynn Kaiser MD Signature Date/Time: 07/05/2022/12:28:47 PM    Final    CT Angio Chest PE W and/or Wo Contrast  Result Date: 07/03/2022 CLINICAL DATA:  Pulmonary embolism (PE) suspected, high prob. COVID positive, tachycardia EXAM: CT ANGIOGRAPHY CHEST WITH CONTRAST TECHNIQUE: Multidetector CT imaging of the chest was performed using the standard  protocol during bolus administration of intravenous contrast. Multiplanar CT image reconstructions and MIPs were obtained to evaluate the vascular anatomy. RADIATION DOSE REDUCTION: This exam was performed according to the departmental dose-optimization program which includes automated exposure control, adjustment of the mA and/or kV according to patient size and/or use of iterative reconstruction technique. CONTRAST:  65m OMNIPAQUE IOHEXOL 350 MG/ML SOLN COMPARISON:  None Available. FINDINGS: Cardiovascular: Very small filling defect in a subsegmental branch of a right upper lobe pulmonary artery compatible with small pulmonary embolus. No additional filling defects. Heart is mildly enlarged. Aorta normal caliber, tortuous with scattered calcifications. Mediastinum/Nodes: No mediastinal, hilar, or axillary adenopathy. Trachea and esophagus are unremarkable. Large hiatal hernia. Thyroid unremarkable. Lungs/Pleura: Compressive atelectasis in the left lower lobe adjacent to the large hiatal hernia. Trace right pleural effusion with minimal right base atelectasis. Upper Abdomen: Reflux of contrast into the IVC and hepatic veins suggesting right heart dysfunction. Musculoskeletal: Chest wall soft tissues are unremarkable. No acute bony abnormality. Old right rib fractures. Review of the MIP images confirms the above findings. IMPRESSION: Very small filling defect in a right upper lobe pulmonary arterial subsegmental branch compatible with small pulmonary embolus. This is of questionable clinical significance due to its very small size. Cardiomegaly. Large hiatal hernia. Trace right pleural effusion. Aortic Atherosclerosis (ICD10-I70.0). These results were called by telephone at the time of interpretation on 07/03/2022 at 8:10 pm to provider HAYLEY NAASZ , who verbally acknowledged these results. Electronically Signed   By: Rolm Baptise M.D.   On: 07/03/2022 20:12   DG Chest Portable 1 View  Result Date:  07/03/2022 CLINICAL DATA:  Shortness of breath. EXAM: PORTABLE CHEST 1 VIEW COMPARISON:  06/29/2022 FINDINGS: Lungs are adequately inflated with stable opacification in the left retrocardiac region compatible with known large hiatal hernia. Lungs are otherwise clear. Cardiomediastinal silhouette and remainder of the exam is unchanged. IMPRESSION: 1. No acute findings. 2. Large hiatal hernia. Electronically Signed   By: Marin Olp M.D.   On: 07/03/2022 15:01   CT Angio Chest PE W and/or Wo Contrast  Result Date: 06/30/2022 CLINICAL DATA:  Pulmonary embolism (PE) suspected, high prob. Weakness, fatigue, hypoxia, COVID positive EXAM: CT ANGIOGRAPHY CHEST WITH CONTRAST TECHNIQUE: Multidetector CT imaging of the chest was performed using the standard protocol during bolus administration of intravenous contrast. Multiplanar CT image reconstructions and MIPs were obtained to evaluate the vascular anatomy. RADIATION DOSE REDUCTION: This exam was performed according to the departmental dose-optimization program which includes automated exposure control, adjustment of the mA and/or kV according to patient size and/or use of iterative reconstruction technique. CONTRAST:  64m OMNIPAQUE IOHEXOL 350 MG/ML SOLN COMPARISON:  04/08/2016 FINDINGS: Cardiovascular: No filling defects in the pulmonary arteries to suggest pulmonary emboli. Heart is normal size. Aorta is normal caliber. Mediastinum/Nodes: No mediastinal, hilar, or axillary adenopathy. Trachea and esophagus are unremarkable. Large hiatal hernia. Thyroid unremarkable. Lungs/Pleura: No confluent airspace opacities or effusions. Upper Abdomen: No acute findings Musculoskeletal: Chest wall soft tissues are unremarkable. No acute bony abnormality. Review of the MIP images confirms the above findings. IMPRESSION: No evidence of pulmonary embolus. Large hiatal hernia. No acute cardiopulmonary disease Electronically Signed   By: KRolm BaptiseM.D.   On: 06/30/2022 03:13    DG Chest Portable 1 View  Result Date: 06/29/2022 CLINICAL DATA:  Shortness of breath EXAM: PORTABLE CHEST 1 VIEW COMPARISON:  06/01/2021 FINDINGS: Heart and mediastinal contours within normal limits. Large hiatal hernia. No confluent airspace opacities or effusions. Thoracolumbar scoliosis and degenerative changes. No acute bony abnormality. IMPRESSION: No active cardiopulmonary disease. Large hiatal hernia. Electronically Signed   By: KRolm BaptiseM.D.   On: 06/29/2022 21:14    Assessment/Plan  1. COVID-19 virus infection Improved lungs sounds Does not need duonebs Keep albuterol for prn cough/wheeze with hx of asthma Off oxygen Off isolation Ok for discharge to [place] with home care  and med management  2. Weakness Improved  3. Memory loss Has poor recall and lacks judgement  Needs med management  4. Self-care deficit With personal care and incontinence Needs assistance with bathing, changing pads etc.   5. Other urinary incontinence Intermittent Needs help with personal care hygiene, reminders.   6. Essential hypertension Controlled  Losartan adjusted  7. Atrial flutter, unspecified type ([place]) Rate is controlled  On Eliquis for CVA risk reduction  Needs cardiology f/u  8. Other acute pulmonary embolism without acute cor pulmonale (HCC) Small Took high dose eliquis for 5 days now on 5 mg.  RX x 3 months Consider 2.5 mg in the future.  Echo with mildly reduced RVSF, mildly enlarged RV size - preserved LVEF, mild LVH, pseudodyskinesis of inferolateral wall (may suggest intraabdominal pathology)   9. Gastroesophageal reflux disease without esophagitis With hiatal hernia on protonix.   10. Low TSH level 0.292 with normal Free T4 on 07/04/22 Will need repeat outpatient   11. Mood disturbance ? Depression and benefit from SSRI She does not like rehab and we will discharge her from skilled care and have her f/u with Dr Reynaldo Minium regarding this issue.   Family/ staff  Communication: resident and her daughter at bedside.   Labs/tests ordered:  TSH   Total time 64mn:  time greater than 50% of total time spent doing pt counseling and coordination of care

## 2022-11-15 ENCOUNTER — Emergency Department (HOSPITAL_BASED_OUTPATIENT_CLINIC_OR_DEPARTMENT_OTHER)
Admission: EM | Admit: 2022-11-15 | Discharge: 2022-11-15 | Disposition: A | Discharge status: Home / Self Care | Payer: Medicare Other | Attending: Emergency Medicine | Admitting: Emergency Medicine

## 2022-11-15 ENCOUNTER — Encounter (HOSPITAL_BASED_OUTPATIENT_CLINIC_OR_DEPARTMENT_OTHER): Payer: Self-pay

## 2022-11-15 DIAGNOSIS — T310 Burns involving less than 10% of body surface: Secondary | ICD-10-CM | POA: Diagnosis not present

## 2022-11-15 DIAGNOSIS — T2114XA Burn of first degree of lower back, initial encounter: Secondary | ICD-10-CM | POA: Insufficient documentation

## 2022-11-15 DIAGNOSIS — Z7901 Long term (current) use of anticoagulants: Secondary | ICD-10-CM | POA: Diagnosis not present

## 2022-11-15 DIAGNOSIS — X19XXXA Contact with other heat and hot substances, initial encounter: Secondary | ICD-10-CM | POA: Diagnosis not present

## 2022-11-15 DIAGNOSIS — T300 Burn of unspecified body region, unspecified degree: Secondary | ICD-10-CM

## 2022-11-15 MED ORDER — SILVER SULFADIAZINE 1 % EX CREA: 1.0000 | TOPICAL_CREAM | Freq: Every day | CUTANEOUS | 0 refills | Status: DC

## 2022-11-15 MED ORDER — SILVER SULFADIAZINE 1 % EX CREA
TOPICAL_CREAM | Freq: Once | CUTANEOUS | Status: AC
  Filled 2022-11-15: qty 85

## 2022-11-15 NOTE — Discharge Instructions (Addendum)
You have a partial-thickness burn as result of the heating pad.  Keep this area clean, cover with a thin layer of Silvadene cream.  I have given you an additional prescription to use if the tube that we have given you here runs out.  Follow-up with your primary care doctor you may also follow-up with the [facility] I given you their information.

## 2022-11-15 NOTE — ED Notes (Signed)
She tells me she has a "blistered area on my back". She slept in a recliner with a heating pad. She is awake, alert and in no distress. Her daughter is with her.
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
## 2023-07-26 ENCOUNTER — Encounter (HOSPITAL_COMMUNITY): Payer: Self-pay

## 2023-07-26 ENCOUNTER — Emergency Department (HOSPITAL_COMMUNITY)
Admission: EM | Admit: 2023-07-26 | Discharge: 2023-07-27 | Disposition: A | Discharge status: Skilled Nursing Facility | Attending: Emergency Medicine | Admitting: Emergency Medicine

## 2023-07-26 ENCOUNTER — Other Ambulatory Visit: Payer: Self-pay

## 2023-07-26 ENCOUNTER — Emergency Department (HOSPITAL_COMMUNITY)

## 2023-07-26 DIAGNOSIS — Z79899 Other long term (current) drug therapy: Secondary | ICD-10-CM | POA: Insufficient documentation

## 2023-07-26 DIAGNOSIS — R531 Weakness: Secondary | ICD-10-CM | POA: Diagnosis present

## 2023-07-26 DIAGNOSIS — I10 Essential (primary) hypertension: Secondary | ICD-10-CM | POA: Diagnosis not present

## 2023-07-26 DIAGNOSIS — Z7901 Long term (current) use of anticoagulants: Secondary | ICD-10-CM | POA: Diagnosis not present

## 2023-07-26 DIAGNOSIS — I619 Nontraumatic intracerebral hemorrhage, unspecified: Secondary | ICD-10-CM

## 2023-07-26 DIAGNOSIS — J449 Chronic obstructive pulmonary disease, unspecified: Secondary | ICD-10-CM | POA: Insufficient documentation

## 2023-07-26 LAB — COMPREHENSIVE METABOLIC PANEL
ALT: 13 U/L (ref 0–44)
AST: 29 U/L (ref 15–41)
Albumin: 4.1 g/dL (ref 3.5–5.0)
Alkaline Phosphatase: 43 U/L (ref 38–126)
Anion gap: 12 (ref 5–15)
BUN: 21 mg/dL (ref 8–23)
CO2: 27 mmol/L (ref 22–32)
Calcium: 10.1 mg/dL (ref 8.9–10.3)
Chloride: 91 mmol/L — ABNORMAL LOW (ref 98–111)
Creatinine, Ser: 0.87 mg/dL (ref 0.44–1.00)
GFR, Estimated: >60 mL/min (ref >60)
Glucose, Bld: 111 mg/dL — ABNORMAL HIGH (ref 70–99)
Potassium: 3.7 mmol/L (ref 3.5–5.1)
Sodium: 130 mmol/L — ABNORMAL LOW (ref 135–145)
Total Bilirubin: 0.8 mg/dL (ref 0.0–1.2)
Total Protein: 6.9 g/dL (ref 6.5–8.1)

## 2023-07-26 LAB — APTT: aPTT: 34 s (ref 24–36)

## 2023-07-26 LAB — CBC
HCT: 39.6 % (ref 36.0–46.0)
Hemoglobin: 13.4 g/dL (ref 12.0–15.0)
MCH: 33 pg (ref 26.0–34.0)
MCHC: 33.8 g/dL (ref 30.0–36.0)
MCV: 97.5 fL (ref 80.0–100.0)
Platelets: 274 10*3/uL (ref 150–400)
RBC: 4.06 MIL/uL (ref 3.87–5.11)
RDW: 12.6 % (ref 11.5–15.5)
WBC: 11 10*3/uL — ABNORMAL HIGH (ref 4.0–10.5)
nRBC: 0 % (ref 0.0–0.2)

## 2023-07-26 LAB — DIFFERENTIAL
Abs Immature Granulocytes: 0.03 10*3/uL (ref 0.00–0.07)
Basophils Absolute: 0 10*3/uL (ref 0.0–0.1)
Basophils Relative: 0 %
Eosinophils Absolute: 0.2 10*3/uL (ref 0.0–0.5)
Eosinophils Relative: 1 %
Immature Granulocytes: 0 %
Lymphocytes Relative: 35 %
Lymphs Abs: 3.9 10*3/uL (ref 0.7–4.0)
Monocytes Absolute: 1.3 10*3/uL — ABNORMAL HIGH (ref 0.1–1.0)
Monocytes Relative: 12 %
Neutro Abs: 5.6 10*3/uL (ref 1.7–7.7)
Neutrophils Relative %: 52 %

## 2023-07-26 LAB — I-STAT CHEM 8, ED
BUN: 24 mg/dL — ABNORMAL HIGH (ref 8–23)
Calcium, Ion: 1.14 mmol/L — ABNORMAL LOW (ref 1.15–1.40)
Chloride: 95 mmol/L — ABNORMAL LOW (ref 98–111)
Creatinine, Ser: 1 mg/dL (ref 0.44–1.00)
Glucose, Bld: 106 mg/dL — ABNORMAL HIGH (ref 70–99)
HCT: 42 % (ref 36.0–46.0)
Hemoglobin: 14.3 g/dL (ref 12.0–15.0)
Potassium: 3.7 mmol/L (ref 3.5–5.1)
Sodium: 130 mmol/L — ABNORMAL LOW (ref 135–145)
TCO2: 29 mmol/L (ref 22–32)

## 2023-07-26 LAB — PROTIME-INR
INR: 1.2 (ref 0.8–1.2)
Prothrombin Time: 15.2 s (ref 11.4–15.2)

## 2023-07-26 LAB — CBG MONITORING, ED: Glucose-Capillary: 109 mg/dL — ABNORMAL HIGH (ref 70–99)

## 2023-07-26 LAB — ETHANOL: Alcohol, Ethyl (B): <10 mg/dL (ref <10)

## 2023-07-26 MED ORDER — ACETAMINOPHEN 325 MG PO TABS: 650.0000 mg | ORAL_TABLET | Freq: Four times a day (QID) | ORAL | Status: DC | PRN

## 2023-07-26 MED ORDER — SODIUM CHLORIDE 0.9 % IV SOLN: Freq: Once | INTRAVENOUS | Status: AC

## 2023-07-26 MED ORDER — POLYVINYL ALCOHOL 1.4 % OP SOLN: 1.0000 [drp] | Freq: Four times a day (QID) | OPHTHALMIC | Status: DC | PRN

## 2023-07-26 MED ORDER — SODIUM CHLORIDE 0.9% FLUSH: 3.0000 mL | Freq: Once | INTRAVENOUS | Status: DC

## 2023-07-26 MED ORDER — GLYCOPYRROLATE 0.2 MG/ML IJ SOLN: 0.2000 mg | INTRAMUSCULAR | Status: DC | PRN

## 2023-07-26 MED ORDER — ONDANSETRON HCL 4 MG/2ML IJ SOLN
4.0000 mg | Freq: Four times a day (QID) | INTRAMUSCULAR | Status: DC | PRN
Start: 2023-07-26 — End: 2023-07-27

## 2023-07-26 MED ORDER — MORPHINE SULFATE (PF) 2 MG/ML IV SOLN: 2.0000 mg | INTRAVENOUS | Status: DC | PRN

## 2023-07-26 MED ORDER — GLYCOPYRROLATE 1 MG PO TABS: 1.0000 mg | ORAL_TABLET | ORAL | Status: DC | PRN

## 2023-07-26 MED ORDER — ONDANSETRON 4 MG PO TBDP: 4.0000 mg | ORAL_TABLET | Freq: Four times a day (QID) | ORAL | Status: DC | PRN

## 2023-07-26 MED ORDER — ACETAMINOPHEN 650 MG RE SUPP: 650.0000 mg | Freq: Four times a day (QID) | RECTAL | Status: DC | PRN

## 2023-07-26 MED ORDER — CLEVIDIPINE BUTYRATE 0.5 MG/ML IV EMUL
0.0000 mg/h | INTRAVENOUS | Status: DC
Start: 2023-07-26 — End: 2023-07-26

## 2023-07-26 MED ORDER — CLEVIDIPINE BUTYRATE 0.5 MG/ML IV EMUL
INTRAVENOUS | Status: AC
Start: 2023-07-26 — End: ?
  Filled 2023-07-26: qty 50

## 2023-07-26 NOTE — ED Notes (Signed)
 Patient placed on the cardiac monitor. Peri care provided due to patient urinating on self. New diaper applied. Bed linen changed. Warm blankets provided. Pt hearing aids given to family member at bedside. Per Neurologist patient on comfort care and no further interventions will be completed.

## 2023-07-26 NOTE — Consult Note (Signed)
 NEUROLOGY CONSULT NOTE   Date of service: July 26, 2023 Patient Name: Michele [name] MRN:  [id number] DOB:  03-May-1927 Chief Complaint: "left sided weakness" Requesting Provider: Derwood Kaplan, MD  History of Present Illness  Michele [name] is a 89 y.o. female with hx of A-fib/flutter on Eliquis, hypertension, hard of hearing, memory loss, hypertension, melanoma, COPD, asthma, comfort care CODE STATUS  Patient was with a caregiver who noted acute onset left-sided weakness at 5:15 PM.  Given patient's CODE STATUS there is discussion with daughter prior to patient being transported to [facility] for further evaluation.  On arrival found to have significant ICH.  Goals of care reviewed discussed with daughter who indicated that they would want focus to be on comfort as per MOLST form  LKW: 5:15 PM Modified rankin score: 4-Needs assistance to walk and tend to bodily needs IV Thrombolysis: No, on Eliquis and ICH EVT: No, ICH ICH Score: 3  1 point for size, 1 point for age, 1 point for infratentorial location of the second bleed  NIHSS components Score: Comment  1a Level of Conscious 0[x]  1[]  2[]  3[]      1b LOC Questions 0[]  1[]  2[x]       1c LOC Commands 0[]  1[x]  2[]       2 Best Gaze 0[]  1[]  2[x]       3 Visual 0[]  1[]  2[x]  3[]      4 Facial Palsy 0[]  1[]  2[]  3[]      5a Motor Arm - left 0[]  1[]  2[]  3[x]  4[]  UN[]    5b Motor Arm - Right 0[]  1[]  2[x]  3[]  4[]  UN[]    6a Motor Leg - Left 0[]  1[]  2[]  3[x]  4[]  UN[]    6b Motor Leg - Right 0[]  1[]  2[x]  3[]  4[]  UN[]    7 Limb Ataxia 0[]  1[]  2[]  3[]  UN[x]     8 Sensory 0[]  1[x]  2[]  UN[]      9 Best Language 0[]  1[]  2[x]  3[]      10 Dysarthria 0[]  1[]  2[x]  UN[]      11 Extinct. and Inattention 0[x]  1[]  2[]       TOTAL:       ROS  Unable to assess secondary to patient's mental status   Past History   Past Medical History:  Diagnosis Date   Aortic atherosclerosis (HCC) 08/13/2019   Asthma    COPD (chronic obstructive pulmonary  disease) (HCC)    Cystocele 08/2011   Diverticulosis    Fractured rib 2017   GERD (gastroesophageal reflux disease)    Hematuria    GU workup   Hiatal hernia    Hypertension    Melanoma (HCC) 11/2001   Osteoarthritis    Osteoporosis    Scoliosis    UTI (urinary tract infection)    septic    Past Surgical History:  Procedure Laterality Date   HYSTEROSCOPY  06/1998   D&C-Benign   MELANOMA EXCISION      Family History: Family History  Problem Relation Age of Onset   Hypertension Father    Heart attack Father    Colon cancer Neg Hx     Social History  reports that she has never smoked. She has never used smokeless tobacco. She reports that she does not drink alcohol and does not use drugs.  Allergies  Allergen Reactions   Penicillins Other (See Comments)    Reaction unknown Has patient had a PCN reaction causing immediate rash, facial/tongue/throat swelling, SOB or lightheadedness with hypotension: n/a Has patient had a PCN reaction causing severe rash  involving mucus membranes or skin necrosis: n/a Has patient had a PCN reaction that required hospitalization: n/a Has patient had a PCN reaction occurring within the last 10 years: n/a If all of the above answers are "NO", then may proceed with Cephalosporin use.    Pneumococcal Vaccines Other (See Comments)    Reaction unknown    Medications   Current Facility-Administered Medications:    sodium chloride flush (NS) 0.9 % injection 3 mL, 3 mL, Intravenous, Once, Nanavati, Ankit, MD  Current Outpatient Medications:    acetaminophen (TYLENOL) 500 MG tablet, Take 1,000 mg by mouth in the morning and at bedtime., Disp: , Rfl:    albuterol (VENTOLIN HFA) 108 (90 Base) MCG/ACT inhaler, Inhale into the lungs every 6 (six) hours as needed for wheezing or shortness of breath. (Patient not taking: Reported on 07/14/2022), Disp: , Rfl:    apixaban (ELIQUIS) 5 MG TABS tablet, Take 1 tablet (5 mg total) by mouth 2 (two) times daily.  Start this after you complete the 10 mg doses, Disp: 60 tablet, Rfl: 2   feeding supplement (BOOST HIGH PROTEIN) LIQD, Take 1 Container by mouth 2 (two) times daily between meals., Disp: , Rfl:    losartan (COZAAR) 25 MG tablet, Take 25 mg by mouth daily., Disp: , Rfl:    losartan (COZAAR) 50 MG tablet, Take 50 mg by mouth. 25 mg in am and 50 mg in Pm, Disp: , Rfl:    metoprolol tartrate (LOPRESSOR) 25 MG tablet, Take 1 tablet (25 mg total) by mouth 2 (two) times daily., Disp: 60 tablet, Rfl: 1   montelukast (SINGULAIR) 10 MG tablet, Take 10 mg by mouth daily. , Disp: , Rfl: 3   Multiple Vitamin (MULTIVITAMIN WITH MINERALS) TABS, Take 1 tablet by mouth daily., Disp: , Rfl:    ondansetron (ZOFRAN) 4 MG tablet, Take 4 mg by mouth every 8 (eight) hours as needed for nausea or vomiting., Disp: , Rfl:    pantoprazole (PROTONIX) 40 MG tablet, Take 40 mg by mouth 2 (two) times daily before a meal., Disp: , Rfl:    silver sulfADIAZINE (SILVADENE) 1 % cream, Apply 1 Application topically daily., Disp: 50 g, Rfl: 0  Vitals   Vitals:   2023/08/02 1852 08/02/23 1930 02-Aug-2023 1935  BP:  (!) 202/103   Pulse:  63   Resp:  15   Temp:   (!) 96.8 F (36 C)  TempSrc:   Axillary  SpO2:  97%   Weight: 46.8 kg      Body mass index is 20.15 kg/m.  Physical Exam   Constitutional: Appears cachectic, frail, ill Psych: Affect calm, minimally interactive Eyes: No scleral injection.  HENT: No OP obstruction.  Head: Normocephalic.  Cardiovascular: Normal rate and regular rhythm.   Neurologic Examination   NIH as above, further examination not performed given patient is comfort care status  Labs/Imaging/Neurodiagnostic studies   CBC:  Recent Labs  Lab Aug 02, 2023 1847 08/02/2023 1851  WBC 11.0*  --   NEUTROABS 5.6  --   HGB 13.4 14.3  HCT 39.6 42.0  MCV 97.5  --   PLT 274  --    Basic Metabolic Panel:  Lab Results  Component Value Date   NA 130 (L) 08/02/23   K 3.7 02-Aug-2023   CO2 27  2023/08/02   GLUCOSE 106 (H) 08-02-2023   BUN 24 (H) 2023-08-02   CREATININE 1.00 08-02-2023   CALCIUM 10.1 August 02, 2023   GFRNONAA >60 08-02-2023   GFRAA >60 08/14/2019  Lipid Panel: No results found for: "LDLCALC" HgbA1c: No results found for: "HGBA1C" Urine Drug Screen:     Component Value Date/Time   LABOPIA NONE DETECTED 06/01/2021 0941   COCAINSCRNUR NONE DETECTED 06/01/2021 0941   LABBENZ NONE DETECTED 06/01/2021 0941   AMPHETMU NONE DETECTED 06/01/2021 0941   THCU NONE DETECTED 06/01/2021 0941   LABBARB NONE DETECTED 06/01/2021 0941    Alcohol Level     Component Value Date/Time   ETH <10 07/26/2023 1847   INR  Lab Results  Component Value Date   INR 1.2 07/26/2023   APTT  Lab Results  Component Value Date   APTT 34 07/26/2023   AED levels: No results found for: "PHENYTOIN", "ZONISAMIDE", "LAMOTRIGINE", "LEVETIRACETA"  CT Head without contrast(Personally reviewed): 1. 5.4 x 2.6 x 4.3 cm (estimated volume 30 mL) acute intraparenchymal hematoma centered at the right external capsule. Mild surrounding edema with 3 mm of right-to-left shift. No intraventricular extension. 2. Additional 6 mm acute intraparenchymal hemorrhage centered at the right cerebellum. 3. Underlying age-related cerebral atrophy with advanced chronic microvascular ischemic disease.   ASSESSMENT   KAETLIN BULLEN is a 89 y.o. female patient whose goals of care are comfort, presenting with acute change in neurological status, found to have an intracerebral hemorrhage.  Goals of care were confirmed by myself personally with daughter who specifically declined blood pressure control and reversal of Eliquis  RECOMMENDATIONS  -Comfort care measures -Discussed with emergency medicine team at bedside -Neurology will sign off at this time, please reach out if additional questions or concerns arise ______________________________________________________________________    Signed, Gordy Councilman, MD [facility]  CRITICAL CARE Performed by: Gordy Councilman   Total critical care time: 35 minutes  Critical care time was exclusive of separately billable procedures and treating other patients.  Critical care was necessary to treat or prevent imminent or life-threatening deterioration --emergent evaluation completed with consideration and discussion of treatment options with family members for life-threatening intracerebral hemorrhage --treatment declined given goals of care  Critical care was time spent personally by me on the following activities: development of treatment plan with patient and/or surrogate as well as nursing, discussions with consultants, evaluation of patient's response to treatment, examination of patient, obtaining history from patient or surrogate, ordering and performing treatments and interventions, ordering and review of laboratory studies, ordering and review of radiographic studies, pulse oximetry and re-evaluation of patient's condition.

## 2023-07-26 NOTE — ED Provider Notes (Signed)
 [place] EMERGENCY DEPARTMENT AT [facility] Provider Note  Arrival date/time:07/26/2023 10:17 PM  HPI/ROS   Michele [name] is a 89 y.o. female with hx of Eliquis use presenting for code stroke.  Patient was reportedly in her normal state of health around 5 PM earlier this evening.  When her care check was checking on her, around 5:15 PM, they noted that patient somehow became altered and not responding appropriately. EMS was called.  They noted a right gaze deviation and left-sided weakness.  On conversation with patient's daughter, patient is ambulatory with a walker at baseline, alert and oriented at baseline and this is profoundly different from her baseline.   A complete ROS was performed with pertinent positives/negatives noted above.   ED Course and Medical Decision Making   I personally reviewed the patient's vitals.  Assessment/Plan: CT HEAD CODE STROKE WO CONTRAST Result Date: 07/26/2023 CLINICAL DATA:  Code stroke. Initial evaluation for acute neuro deficit, stroke suspected EXAM: CT HEAD WITHOUT CONTRAST TECHNIQUE: Contiguous axial images were obtained from the base of the skull through the vertex without intravenous contrast. RADIATION DOSE REDUCTION: This exam was performed according to the departmental dose-optimization program which includes automated exposure control, adjustment of the mA and/or kV according to patient size and/or use of iterative reconstruction technique. COMPARISON:  Prior CT from 06/01/2021. FINDINGS: Brain: Age-related cerebral atrophy with advanced chronic microvascular ischemic disease. Acute intraparenchymal hematoma centered at the right external capsule measures 5.4 x 2.6 x 4.3 cm (estimated volume 30 mL). Mild surrounding edema with 3 mm of right-to-left shift. No intraventricular extension. Additional 6 mm acute intraparenchymal hemorrhage centered at the right cerebellum (series 2, image 10). No other acute large vessel territory  infarct. No mass lesion. No hydrocephalus or extra-axial fluid collection. Vascular: No abnormal hyperdense vessel. Calcified atherosclerosis present at the skull base. Skull: Scalp soft tissues within normal limits.  Calvarium intact. Sinuses/Orbits: Right gaze preference noted. Right maxillary sinus retention cyst noted. Paranasal sinuses are otherwise largely clear. Small right mastoid effusion noted. Negative nasopharynx. Other: None. ASPECTS [facility] Stroke Program Early CT Score) Acute ICH, does not apply. IMPRESSION: 1. 5.4 x 2.6 x 4.3 cm (estimated volume 30 mL) acute intraparenchymal hematoma centered at the right external capsule. Mild surrounding edema with 3 mm of right-to-left shift. No intraventricular extension. 2. Additional 6 mm acute intraparenchymal hemorrhage centered at the right cerebellum. 3. Underlying age-related cerebral atrophy with advanced chronic microvascular ischemic disease. These results were communicated to Dr. Iver Nestle at 7:09 pm on 07/26/2023 by text page via the [facility] messaging system. Electronically Signed   By: Rise Mu M.D.   On: 07/26/2023 19:11    Michele [name] is a 89 y.o. female with history of Eliquis use who is presenting for code stroke.  The patient was quickly assessed by myself and the attending. They were protecting/maintaining their own airway. Neurology team at bedside on arrival.  CT scanner was made available and the patient was rapidly transported. Head CT full report above, but in summary revealed large intraparenchymal hemorrhage.   Patient arrived with a DNR form and a MOST form indicating to pursue comfort measures only. Our team as well as the neurology team was able to get in touch with patient's daughter over the phone, Dennis Bast. She indicated that patient would likely want to proceed with comfort measures.  We specifically discussed blood pressure control and reversal of Eliquis and patient's daughter indicated that based on  her understanding of patient's wishes, she  would not wish to proceed with these measures.  In conjunction with patient's wishes, we will not proceed with blood pressure control or Eliquis reversal.  Daughter is now at bedside, I discussed neck steps with daughter.  With our extensive conversation, family decided to proceed with comfort measures and hospice care.  Patient was placed in TOC boarder status. Plan for TOC to see in AM.  Patient family will return around 9AM for updates and to discuss next steps. If there are any acute changes overnight, will plan to contact family immediately.   Disposition: ED observation for TOC to see in AM  Clinical Impression:  1. Hemorrhagic stroke [facility])     Rx / DC Orders ED Discharge Orders     None       The plan for this patient was discussed with Dr. Rhunette Croft, who voiced agreement and who oversaw evaluation and treatment of this patient.   Clinical Complexity A medically appropriate history, review of systems, and physical exam was performed.  Patient's presentation is most consistent with acute presentation with potential threat to life or bodily function.  Medical Decision Making Amount and/or Complexity of Data Reviewed Labs: ordered. Radiology: ordered.  Risk Prescription drug management.    Physical Exam and Medical History   Vitals:   07/26/23 1852 07/26/23 1930 07/26/23 1935 07/26/23 2115  BP:  (!) 202/103  (!) 152/94  Pulse:  63  68  Resp:  15  16  Temp:   (!) 96.8 F (36 C) 97.8 F (36.6 C)  TempSrc:   Axillary Tympanic  SpO2:  97%  95%  Weight: 46.8 kg       Physical Exam Eyes:     Comments: Right gaze deviation, will not cross midline  Cardiovascular:     Pulses: Normal pulses.  Pulmonary:     Effort: Pulmonary effort is normal.     Breath sounds: Normal breath sounds.  Abdominal:     General: Abdomen is flat.     Palpations: Abdomen is soft.  Skin:    General: Skin is warm and dry.     Capillary  Refill: Capillary refill takes less than 2 seconds.  Neurological:     Mental Status: She is alert.     Comments: Opens eyes to command, intermittently follows commands. Not moving left side.     Medical History: Allergies  Allergen Reactions   Penicillins Other (See Comments)    Reaction unknown Has patient had a PCN reaction causing immediate rash, facial/tongue/throat swelling, SOB or lightheadedness with hypotension: n/a Has patient had a PCN reaction causing severe rash involving mucus membranes or skin necrosis: n/a Has patient had a PCN reaction that required hospitalization: n/a Has patient had a PCN reaction occurring within the last 10 years: n/a If all of the above answers are "NO", then may proceed with Cephalosporin use.    Pneumococcal Vaccines Other (See Comments)    Reaction unknown   Past Medical History:  Diagnosis Date   Aortic atherosclerosis (HCC) 08/13/2019   Asthma    COPD (chronic obstructive pulmonary disease) (HCC)    Cystocele 08/2011   Diverticulosis    Fractured rib 2017   GERD (gastroesophageal reflux disease)    Hematuria    GU workup   Hiatal hernia    Hypertension    Melanoma (HCC) 11/2001   Osteoarthritis    Osteoporosis    Scoliosis    UTI (urinary tract infection)    septic    Past Surgical History:  Procedure Laterality Date   HYSTEROSCOPY  06/1998   D&C-Benign   MELANOMA EXCISION     Family History  Problem Relation Age of Onset   Hypertension Father    Heart attack Father    Colon cancer Neg Hx     Social History   Tobacco Use   Smoking status: Never   Smokeless tobacco: Never  Vaping Use   Vaping status: Never Used  Substance Use Topics   Alcohol use: No    Alcohol/week: 0.0 standard drinks of alcohol    Comment: cocktail    Drug use: No    Procedures   If procedures were preformed on this patient, they are listed below:  Procedures   -------- HPI and MDM generated using voice dictation software and may  contain dictation errors. Please contact me for any clarification or with any questions.   Cephus Slater, MD Emergency Medicine PGY-2    Caron Presume, MD 07/26/23 [id number]    Derwood Kaplan, MD 07/27/23 [phone]

## 2023-07-27 ENCOUNTER — Non-Acute Institutional Stay (SKILLED_NURSING_FACILITY): Payer: Self-pay | Admitting: Internal Medicine

## 2023-07-27 DIAGNOSIS — I10 Essential (primary) hypertension: Secondary | ICD-10-CM

## 2023-07-27 DIAGNOSIS — I619 Nontraumatic intracerebral hemorrhage, unspecified: Secondary | ICD-10-CM

## 2023-07-27 DIAGNOSIS — Z515 Encounter for palliative care: Secondary | ICD-10-CM

## 2023-07-27 MED ORDER — LORAZEPAM 0.5 MG PO TABS: 0.5000 mg | ORAL_TABLET | ORAL | 0 refills | Status: AC | PRN

## 2023-07-27 MED ORDER — MORPHINE SULFATE (CONCENTRATE) 20 MG/ML PO SOLN: 5.0000 mg | ORAL | 0 refills | Status: AC | PRN

## 2023-07-27 NOTE — Progress Notes (Unsigned)
 Provider:   Location:  Medical illustrator of Service:  SNF (31)  PCP: Geoffry Paradise, MD Patient Care Team: Geoffry Paradise, MD as PCP - General (Internal Medicine)  Extended Emergency Contact Information Primary Emergency Contact: [facility] Address: [address]          [place], [place] [address] Darden Amber of [employer] Phone: [phone] Relation: Daughter Interpreter needed? No Secondary Emergency Contact: Aurora Mask [place] of [place] Mobile Phone: [phone] Relation: Daughter  Code Status: DNR Goals of Care: Advanced Directive information    07/26/2023    7:36 PM  Advanced Directives  Does Patient Have a Medical Advance Directive? Yes  Type of Advance Directive Out of facility DNR (pink MOST or yellow form)      Chief Complaint  Patient presents with   Acute Visit    HPI: Patient is a 89 y.o. female seen today for admission to Rehab for End of Life Care  Patient has h/o HTN, PE in chronic Eliquis, Atrial Flutter  Was send to ED for Change in Mental status CT scan showed Acute Intraparenchymal Bleed She was unresponsive Family decided for Comfort care She is now in Rehab in [facility] Still Unresponsive Shallow Breathing Low grade temp  Daughters in the room   Past Medical History:  Diagnosis Date   Aortic atherosclerosis (HCC) 08/13/2019   Asthma    COPD (chronic obstructive pulmonary disease) (HCC)    Cystocele 08/2011   Diverticulosis    Fractured rib 2017   GERD (gastroesophageal reflux disease)    Hematuria    GU workup   Hiatal hernia    Hypertension    Melanoma (HCC) 11/2001   Osteoarthritis    Osteoporosis    Scoliosis    UTI (urinary tract infection)    septic   Past Surgical History:  Procedure Laterality Date   HYSTEROSCOPY  06/1998   D&C-Benign   MELANOMA EXCISION      reports that she has never smoked. She has never used smokeless tobacco. She reports that she does not drink  alcohol and does not use drugs. Social History   Socioeconomic History   Marital status: Widowed    Spouse name: Not on file   Number of children: Not on file   Years of education: Not on file   Highest education level: Not on file  Occupational History   Not on file  Tobacco Use   Smoking status: Never   Smokeless tobacco: Never  Vaping Use   Vaping status: Never Used  Substance and Sexual Activity   Alcohol use: No    Alcohol/week: 0.0 standard drinks of alcohol    Comment: cocktail    Drug use: No   Sexual activity: Never  Other Topics Concern   Not on file  Social History Narrative   Not on file   Social Drivers of Health   Financial Resource Strain: Not on file  Food Insecurity: No Food Insecurity (07/01/2022)   Hunger Vital Sign    Worried About Running Out of Food in the Last Year: Never true    Ran Out of Food in the Last Year: Never true  Transportation Needs: No Transportation Needs (07/01/2022)   PRAPARE - Administrator, Civil Service (Medical): No    Lack of Transportation (Non-Medical): No  Physical Activity: Not on file  Stress: Not on file  Social Connections: Not on file  Intimate Partner Violence: Not At Risk (07/01/2022)   Humiliation, Afraid,  Rape, and Kick questionnaire    Fear of Current or Ex-Partner: No    Emotionally Abused: No    Physically Abused: No    Sexually Abused: No    Functional Status Survey:    Family History  Problem Relation Age of Onset   Hypertension Father    Heart attack Father    Colon cancer Neg Hx     Health Maintenance  Topic Date Due   Medicare Annual Wellness (AWV)  Never done   Zoster Vaccines- Shingrix (1 of 2) 09/12/1978   DTaP/Tdap/Td (5 - Td or Tdap) 06/30/2019   INFLUENZA VACCINE  12/25/2022   COVID-19 Vaccine (6 - 2024-25 season) 01/25/2023   Pneumonia Vaccine 23+ Years old  Completed   DEXA SCAN  Completed   HPV VACCINES  Aged Out   MAMMOGRAM  Discontinued    Allergies  Allergen  Reactions   Penicillins Other (See Comments)    Reaction unknown Has patient had a PCN reaction causing immediate rash, facial/tongue/throat swelling, SOB or lightheadedness with hypotension: n/a Has patient had a PCN reaction causing severe rash involving mucus membranes or skin necrosis: n/a Has patient had a PCN reaction that required hospitalization: n/a Has patient had a PCN reaction occurring within the last 10 years: n/a If all of the above answers are "NO", then may proceed with Cephalosporin use.    Pneumococcal Vaccines Other (See Comments)    Reaction unknown    Outpatient Encounter Medications as of 07/27/2023  Medication Sig   LORazepam (ATIVAN) 0.5 MG tablet Take 1 tablet (0.5 mg total) by mouth every 2 (two) hours as needed for anxiety.   morphine (ROXANOL) 20 MG/ML concentrated solution Take 0.25 mLs (5 mg total) by mouth every 2 (two) hours as needed for severe pain (pain score 7-10) or shortness of breath.   acetaminophen (TYLENOL) 500 MG tablet Take 1,000 mg by mouth in the morning and at bedtime.   albuterol (VENTOLIN HFA) 108 (90 Base) MCG/ACT inhaler Inhale into the lungs every 6 (six) hours as needed for wheezing or shortness of breath. (Patient not taking: Reported on 07/14/2022)   apixaban (ELIQUIS) 5 MG TABS tablet Take 1 tablet (5 mg total) by mouth 2 (two) times daily. Start this after you complete the 10 mg doses   feeding supplement (BOOST HIGH PROTEIN) LIQD Take 1 Container by mouth 2 (two) times daily between meals.   losartan (COZAAR) 50 MG tablet Take 50 mg by mouth daily. 25 mg in am and 50 mg in Pm   metoprolol tartrate (LOPRESSOR) 25 MG tablet Take 1 tablet (25 mg total) by mouth 2 (two) times daily.   montelukast (SINGULAIR) 10 MG tablet Take 10 mg by mouth daily.    Multiple Vitamin (MULTIVITAMIN WITH MINERALS) TABS Take 1 tablet by mouth daily.   ondansetron (ZOFRAN) 4 MG tablet Take 4 mg by mouth every 8 (eight) hours as needed for nausea or vomiting.  (Patient not taking: Reported on 07/26/2023)   pantoprazole (PROTONIX) 40 MG tablet Take 40 mg by mouth 2 (two) times daily before a meal.   Facility-Administered Encounter Medications as of 07/27/2023  Medication   acetaminophen (TYLENOL) tablet 650 mg   Or   acetaminophen (TYLENOL) suppository 650 mg   glycopyrrolate (ROBINUL) tablet 1 mg   Or   glycopyrrolate (ROBINUL) injection 0.2 mg   Or   glycopyrrolate (ROBINUL) injection 0.2 mg   morphine (PF) 2 MG/ML injection 2-4 mg   ondansetron (ZOFRAN-ODT) disintegrating tablet 4 mg  Or   ondansetron (ZOFRAN) injection 4 mg   polyvinyl alcohol (LIQUIFILM TEARS) 1.4 % ophthalmic solution 1 drop   sodium chloride flush (NS) 0.9 % injection 3 mL    Review of Systems  Unable to perform ROS: Patient unresponsive    There were no vitals filed for this visit. There is no height or weight on file to calculate BMI. Physical Exam Vitals reviewed.  Constitutional:      Comments: Unresponsive  HENT:     Head: Normocephalic.     Nose: Nose normal.     Mouth/Throat:     Mouth: Mucous membranes are moist.     Pharynx: Oropharynx is clear.  Eyes:     Pupils: Pupils are equal, round, and reactive to light.  Cardiovascular:     Rate and Rhythm: Regular rhythm. Tachycardia present.     Pulses: Normal pulses.     Heart sounds: Normal heart sounds. No murmur heard. Pulmonary:     Effort: Pulmonary effort is normal.     Breath sounds: Rhonchi present.  Abdominal:     General: Abdomen is flat. Bowel sounds are normal.     Palpations: Abdomen is soft.  Musculoskeletal:        General: No swelling.     Cervical back: Neck supple.  Skin:    General: Skin is warm.  Neurological:     Comments: Unresponsive  Psychiatric:        Mood and Affect: Mood normal.        Thought Content: Thought content normal.     Labs reviewed: Basic Metabolic Panel: Recent Labs    07/26/23 1847 07/26/23 1851  NA 130* 130*  K 3.7 3.7  CL 91* 95*  CO2  27  --   GLUCOSE 111* 106*  BUN 21 24*  CREATININE 0.87 1.00  CALCIUM 10.1  --    Liver Function Tests: Recent Labs    07/26/23 1847  AST 29  ALT 13  ALKPHOS 43  BILITOT 0.8  PROT 6.9  ALBUMIN 4.1   No results for input(s): "LIPASE", "AMYLASE" in the last 8760 hours. No results for input(s): "AMMONIA" in the last 8760 hours. CBC: Recent Labs    07/26/23 1847 07/26/23 1851  WBC 11.0*  --   NEUTROABS 5.6  --   HGB 13.4 14.3  HCT 39.6 42.0  MCV 97.5  --   PLT 274  --    Cardiac Enzymes: No results for input(s): "CKTOTAL", "CKMB", "CKMBINDEX", "TROPONINI" in the last 8760 hours. BNP: Invalid input(s): "POCBNP" No results found for: "HGBA1C" Lab Results  Component Value Date   TSH 0.292 (L) 07/04/2022   No results found for: "VITAMINB12" No results found for: "FOLATE" No results found for: "IRON", "TIBC", "FERRITIN"  Imaging and Procedures obtained prior to SNF admission: CT HEAD CODE STROKE WO CONTRAST Result Date: 07/26/2023 CLINICAL DATA:  Code stroke. Initial evaluation for acute neuro deficit, stroke suspected EXAM: CT HEAD WITHOUT CONTRAST TECHNIQUE: Contiguous axial images were obtained from the base of the skull through the vertex without intravenous contrast. RADIATION DOSE REDUCTION: This exam was performed according to the departmental dose-optimization program which includes automated exposure control, adjustment of the mA and/or kV according to patient size and/or use of iterative reconstruction technique. COMPARISON:  Prior CT from 06/01/2021. FINDINGS: Brain: Age-related cerebral atrophy with advanced chronic microvascular ischemic disease. Acute intraparenchymal hematoma centered at the right external capsule measures 5.4 x 2.6 x 4.3 cm (estimated volume 30 mL). Mild surrounding  edema with 3 mm of right-to-left shift. No intraventricular extension. Additional 6 mm acute intraparenchymal hemorrhage centered at the right cerebellum (series 2, image 10). No other  acute large vessel territory infarct. No mass lesion. No hydrocephalus or extra-axial fluid collection. Vascular: No abnormal hyperdense vessel. Calcified atherosclerosis present at the skull base. Skull: Scalp soft tissues within normal limits.  Calvarium intact. Sinuses/Orbits: Right gaze preference noted. Right maxillary sinus retention cyst noted. Paranasal sinuses are otherwise largely clear. Small right mastoid effusion noted. Negative nasopharynx. Other: None. ASPECTS [facility] Stroke Program Early CT Score) Acute ICH, does not apply. IMPRESSION: 1. 5.4 x 2.6 x 4.3 cm (estimated volume 30 mL) acute intraparenchymal hematoma centered at the right external capsule. Mild surrounding edema with 3 mm of right-to-left shift. No intraventricular extension. 2. Additional 6 mm acute intraparenchymal hemorrhage centered at the right cerebellum. 3. Underlying age-related cerebral atrophy with advanced chronic microvascular ischemic disease. These results were communicated to Dr. Iver Nestle at 7:09 pm on 07/26/2023 by text page via the [facility] messaging system. Electronically Signed   By: Rise Mu M.D.   On: 07/26/2023 19:11    Assessment/Plan 1. Hemorrhagic stroke (HCC) (Primary)   2. Essential hypertension   3. End of life care  All PO Meds Stopped Started on Roxanol, ativan and Atropine for Comfort Hospice will admit her today D/w The family in room    Family/ staff Communication:   Labs/tests ordered:

## 2023-07-27 NOTE — Progress Notes (Addendum)
 11am: CSW spoke with [place] at [facility] who states patient will go to room 154 at the facility on the SNF side. The number to call for report is [phone].  9:30am: CSW spoke with [place] at [facility] who states the facility prefers [facility] for hospice services.  CSW sent secure chat to Ines Bloomer, RN at [employer] [employer] to make referral for hospice services.  Edwin Dada, MSW, LCSW Transitions of Care  Clinical Social Worker II [phone]

## 2023-07-27 NOTE — Progress Notes (Signed)
 [facility] Liaison Note  Received request from Okey Regal, Transitions of Care Manager, for hospice services after discharge. Spoke with three daughters at bedside to initiate education related to hospice philosophy, services, and team approach to care in home, facility, or inpatient facility. Patient/family verbalized understanding of information given. Per discussion, the plan is for discharge to her prior facility by [facility]/EMS today with hospice services.   DME needs discussed. At this time there are no DME needs that the family is aware of.   Please send signed and completed DNR home with patient/family. Please provide prescriptions at discharge as needed to ensure ongoing symptom management.   [facility] information and contact numbers given to family. Above information shared with Okey Regal, Transitions of Care Manager. Please call with any questions or concerns.   Thank you for the opportunity to participate in this patient's care.   Glenna Fellows BSN, Charity fundraiser, OCN [employer] [phone]

## 2023-07-27 NOTE — Care Management (Signed)
 Transition of Care [facility]) - Emergency Department Mini Assessment   Patient Details  Name: Michele [name] MRN: [id number] Date of Birth: 01-29-1927  Transition of [facility]) CM/SW Contact:    Lockie Pares, RN Phone Number: 07/27/2023, 9:40 AM   Clinical Narrative:  Patient presented with AMS, ICH bleed found.  Spoke to daughters at bedside. Discussed home hospice versus residential. They do have caregivers for her in her independent living of [facility]. Gave Medicare list of hospices. [facility] utilizes [facility].  Messaged with CSW and Shawn perkins from [facility] [employer]. He will reach out to family.   ED Mini Assessment: What brought you to the Emergency Department? : AMS     Barrier interventions: Spoke to family     Interventions which prevented an admission or readmission: Hospice    Patient Contact and Communications        ,                 Admission diagnosis:  Code stroke Patient Active Problem List   Diagnosis Date Noted   Anxiety state 07/08/2022   Other specified abdominal hernia without obstruction or gangrene 07/08/2022   Right lower quadrant abdominal pain 07/08/2022   Atrial flutter (HCC) 07/03/2022   History of COVID-19 07/03/2022   Acute pulmonary embolism (HCC) 07/03/2022   COVID-19 virus infection 06/30/2022   Acute hypoxemic respiratory failure (HCC) 06/30/2022   Confusion 06/30/2022   Hiatal hernia 06/30/2022   Low back pain 10/19/2019   Backache 10/19/2019   Fall 10/19/2019   Non-thrombocytopenic purpura (HCC) 08/17/2019   Nausea and vomiting 08/13/2019   Elevated lipase 08/13/2019   Dilated cbd, acquired 08/13/2019   Gallbladder hydrops 08/13/2019   Hyponatremia 08/13/2019   Aortic atherosclerosis (HCC) 08/13/2019   Nausea & vomiting 08/13/2019   Disorder of the skin and subcutaneous tissue, unspecified 04/28/2019   Chronic obstructive pulmonary disease (HCC) 12/10/2016   Abnormal gait 07/03/2016   Personal history of  (healed) traumatic fracture 07/03/2016   Constipation 04/28/2016   GERD (gastroesophageal reflux disease) 04/28/2016   Multiple closed fractures of ribs of right side    Hemothorax on right    Leukocytosis    Ribs, multiple fractures 04/08/2016   Atrophic gastritis 10/04/2015   Chronic kidney disease due to hypertension 01/03/2015   Encounter for general adult medical examination without abnormal findings 12/27/2014   Cystocele 08/18/2012   Herpesviral vesicular dermatitis 06/15/2012   Lumbosacral spondylosis without myelopathy 05/31/2012   Sepsis secondary to UTI (HCC) 04/27/2012   E coli bacteremia 04/27/2012   Elevated troponin 04/27/2012   Essential hypertension 04/27/2012   Asthma 04/27/2012   Nausea 04/27/2012   Age-related osteoporosis without current pathological fracture 08/15/2010   Allergic rhinitis 06/27/2009   Osteoarthritis 06/27/2009   PCP:  Geoffry Paradise, MD Pharmacy:   [facility] [phone] Ginette Otto, [place] - [address] AT [facility] [address] [place] [address] Phone: [phone] Fax: [phone]  [facility] [place], [place] - [address] [facility] [address] [place] [address] Phone: [phone] Fax: [phone]

## 2023-08-25 DEATH — deceased
